# Patient Record
Sex: Male | Born: 1965 | Race: Black or African American | Hispanic: No | Marital: Married | Smoking: Never smoker
Health system: Southern US, Community
[De-identification: ages and names within clinical notes are randomized; demographics above are authoritative.]

## PROBLEM LIST (undated history)

## (undated) DIAGNOSIS — I10 Essential (primary) hypertension: Secondary | ICD-10-CM

## (undated) DIAGNOSIS — M542 Cervicalgia: Secondary | ICD-10-CM

## (undated) DIAGNOSIS — K635 Polyp of colon: Secondary | ICD-10-CM

## (undated) DIAGNOSIS — K219 Gastro-esophageal reflux disease without esophagitis: Secondary | ICD-10-CM

## (undated) DIAGNOSIS — Z800 Family history of malignant neoplasm of digestive organs: Secondary | ICD-10-CM

## (undated) DIAGNOSIS — E785 Hyperlipidemia, unspecified: Secondary | ICD-10-CM

## (undated) DIAGNOSIS — L02419 Cutaneous abscess of limb, unspecified: Secondary | ICD-10-CM

## (undated) DIAGNOSIS — Z8619 Personal history of other infectious and parasitic diseases: Secondary | ICD-10-CM

## (undated) DIAGNOSIS — G4730 Sleep apnea, unspecified: Secondary | ICD-10-CM

## (undated) DIAGNOSIS — F329 Major depressive disorder, single episode, unspecified: Secondary | ICD-10-CM

## (undated) DIAGNOSIS — L732 Hidradenitis suppurativa: Secondary | ICD-10-CM

## (undated) DIAGNOSIS — F32A Depression, unspecified: Secondary | ICD-10-CM

## (undated) DIAGNOSIS — Z789 Other specified health status: Secondary | ICD-10-CM

## (undated) DIAGNOSIS — M1990 Unspecified osteoarthritis, unspecified site: Secondary | ICD-10-CM

## (undated) HISTORY — PX: ABSCESS DRAINAGE: SHX1119

## (undated) HISTORY — DX: Hidradenitis suppurativa: L73.2

## (undated) HISTORY — DX: Unspecified osteoarthritis, unspecified site: M19.90

## (undated) HISTORY — DX: Cervicalgia: M54.2

## (undated) HISTORY — PX: ELBOW SURGERY: SHX618

## (undated) HISTORY — DX: Hyperlipidemia, unspecified: E78.5

## (undated) HISTORY — DX: Cutaneous abscess of limb, unspecified: L02.419

## (undated) HISTORY — DX: Major depressive disorder, single episode, unspecified: F32.9

## (undated) HISTORY — DX: Gastro-esophageal reflux disease without esophagitis: K21.9

## (undated) HISTORY — DX: Depression, unspecified: F32.A

## (undated) HISTORY — DX: Personal history of other infectious and parasitic diseases: Z86.19

## (undated) HISTORY — DX: Essential (primary) hypertension: I10

## (undated) HISTORY — DX: Sleep apnea, unspecified: G47.30

---
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
## 2015-10-08 ENCOUNTER — Encounter (HOSPITAL_COMMUNITY): Payer: Self-pay | Admitting: Emergency Medicine

## 2015-10-08 ENCOUNTER — Emergency Department (HOSPITAL_COMMUNITY)
Admission: EM | Admit: 2015-10-08 | Discharge: 2015-10-09 | Disposition: A | Discharge status: Home / Self Care | Payer: Managed Care, Other (non HMO) | Attending: Emergency Medicine | Admitting: Emergency Medicine

## 2015-10-08 DIAGNOSIS — S01512A Laceration without foreign body of oral cavity, initial encounter: Secondary | ICD-10-CM

## 2015-10-08 DIAGNOSIS — Y9289 Other specified places as the place of occurrence of the external cause: Secondary | ICD-10-CM | POA: Diagnosis not present

## 2015-10-08 DIAGNOSIS — S0993XA Unspecified injury of face, initial encounter: Secondary | ICD-10-CM | POA: Diagnosis present

## 2015-10-08 DIAGNOSIS — W228XXA Striking against or struck by other objects, initial encounter: Secondary | ICD-10-CM | POA: Diagnosis not present

## 2015-10-08 DIAGNOSIS — Y9383 Activity, rough housing and horseplay: Secondary | ICD-10-CM | POA: Insufficient documentation

## 2015-10-08 DIAGNOSIS — Z23 Encounter for immunization: Secondary | ICD-10-CM | POA: Insufficient documentation

## 2015-10-08 DIAGNOSIS — Y998 Other external cause status: Secondary | ICD-10-CM | POA: Diagnosis not present

## 2015-10-08 MED ORDER — LIDOCAINE HCL (PF) 1 % IJ SOLN
2.0000 mL | Freq: Once | INTRAMUSCULAR | Status: DC
  Filled 2015-10-08: qty 5

## 2015-10-08 MED ORDER — IBUPROFEN 200 MG PO TABS
400.0000 mg | ORAL_TABLET | Freq: Once | ORAL | Status: AC | PRN
  Administered 2015-10-08: 400 mg via ORAL
  Filled 2015-10-08: qty 2

## 2015-10-08 NOTE — ED Notes (Signed)
Patient called x1-no answer 
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
## 2015-10-08 NOTE — ED Notes (Signed)
Pt called to room, no responce

## 2015-10-09 MED ORDER — TETANUS-DIPHTH-ACELL PERTUSSIS 5-2.5-18.5 LF-MCG/0.5 IM SUSP
0.5000 mL | Freq: Once | INTRAMUSCULAR | Status: AC
  Administered 2015-10-09: 0.5 mL via INTRAMUSCULAR
  Filled 2015-10-09: qty 0.5

## 2015-10-09 NOTE — Discharge Instructions (Signed)
Make sure to rinse your mouth with water after all food and fluid consumption The sutures will dissolve over time and not need to be removed

## 2016-01-19 ENCOUNTER — Encounter (HOSPITAL_COMMUNITY): Payer: Self-pay

## 2016-01-19 ENCOUNTER — Emergency Department (HOSPITAL_COMMUNITY)
Admission: EM | Admit: 2016-01-19 | Discharge: 2016-01-19 | Disposition: A | Discharge status: Home / Self Care | Payer: Managed Care, Other (non HMO) | Attending: Emergency Medicine | Admitting: Emergency Medicine

## 2016-01-19 DIAGNOSIS — Z711 Person with feared health complaint in whom no diagnosis is made: Secondary | ICD-10-CM | POA: Diagnosis not present

## 2016-01-19 DIAGNOSIS — Z79899 Other long term (current) drug therapy: Secondary | ICD-10-CM | POA: Diagnosis not present

## 2016-01-19 DIAGNOSIS — R42 Dizziness and giddiness: Secondary | ICD-10-CM | POA: Diagnosis not present

## 2016-01-19 LAB — COMPREHENSIVE METABOLIC PANEL
ALBUMIN: 4.3 g/dL (ref 3.5–5.0)
ALK PHOS: 70 U/L (ref 38–126)
ALT: 27 U/L (ref 17–63)
ANION GAP: 6 (ref 5–15)
AST: 24 U/L (ref 15–41)
BUN: 14 mg/dL (ref 6–20)
CALCIUM: 9.2 mg/dL (ref 8.9–10.3)
CO2: 26 mmol/L (ref 22–32)
Chloride: 106 mmol/L (ref 101–111)
Creatinine, Ser: 1.05 mg/dL (ref 0.61–1.24)
GFR calc non Af Amer: >60 mL/min (ref >60)
Glucose, Bld: 83 mg/dL (ref 65–99)
Potassium: 4.1 mmol/L (ref 3.5–5.1)
SODIUM: 138 mmol/L (ref 135–145)
TOTAL PROTEIN: 7.2 g/dL (ref 6.5–8.1)
Total Bilirubin: 0.9 mg/dL (ref 0.3–1.2)

## 2016-01-19 LAB — CBC
HCT: 48.5 % (ref 39.0–52.0)
HEMOGLOBIN: 16.1 g/dL (ref 13.0–17.0)
MCH: 28 pg (ref 26.0–34.0)
MCHC: 33.2 g/dL (ref 30.0–36.0)
MCV: 84.5 fL (ref 78.0–100.0)
Platelets: 225 10*3/uL (ref 150–400)
RBC: 5.74 MIL/uL (ref 4.22–5.81)
RDW: 15.2 % (ref 11.5–15.5)
WBC: 6.3 10*3/uL (ref 4.0–10.5)

## 2016-01-19 LAB — I-STAT TROPONIN, ED: TROPONIN I, POC: 0 ng/mL (ref 0.00–0.08)

## 2016-01-19 NOTE — ED Notes (Signed)
Upon further questioning, pt states he has been depressed lately but denies SI/HI.  He wouldn't elaborate, however.
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
## 2016-01-19 NOTE — Discharge Instructions (Signed)
Return here as needed.  Follow-up with your primary care doctor °

## 2016-01-19 NOTE — ED Notes (Signed)
Pt states since last night around 6pm started getting short of breath with dizziness. Currently states he feels light headed with RT arm pain/tingling.  Denies n/v.

## 2016-01-20 LAB — HIV ANTIBODY (ROUTINE TESTING W REFLEX): HIV Screen 4th Generation wRfx: NONREACTIVE

## 2016-06-18 ENCOUNTER — Ambulatory Visit (INDEPENDENT_AMBULATORY_CARE_PROVIDER_SITE_OTHER): Payer: Managed Care, Other (non HMO) | Admitting: Pulmonary Disease

## 2016-06-18 ENCOUNTER — Ambulatory Visit (INDEPENDENT_AMBULATORY_CARE_PROVIDER_SITE_OTHER)
Admission: RE | Admit: 2016-06-18 | Discharge: 2016-06-18 | Disposition: A | Discharge status: Home / Self Care | Payer: Managed Care, Other (non HMO) | Source: Ambulatory Visit | Attending: Pulmonary Disease | Admitting: Pulmonary Disease

## 2016-06-18 ENCOUNTER — Encounter: Payer: Self-pay | Admitting: Pulmonary Disease

## 2016-06-18 VITALS — BP 122/84 | HR 72 | Ht 68.0 in | Wt 184.2 lb

## 2016-06-18 DIAGNOSIS — R05 Cough: Secondary | ICD-10-CM | POA: Diagnosis not present

## 2016-06-18 DIAGNOSIS — R053 Chronic cough: Secondary | ICD-10-CM

## 2016-06-18 DIAGNOSIS — R059 Cough, unspecified: Secondary | ICD-10-CM

## 2016-06-18 LAB — NITRIC OXIDE: Nitric Oxide: 10

## 2016-06-18 MED ORDER — FLUTICASONE PROPIONATE 50 MCG/ACT NA SUSP: 2.0000 | Freq: Every day | NASAL | 2 refills | Status: DC

## 2016-06-18 NOTE — Patient Instructions (Signed)
Finish course of prednisone Advair one puff twice per day, and rinse mouth after each use Proair two puffs every four to six hours as needed for cough, wheezing, or chest congestion Nasal irrigation (saline nasal spray) daily Flonase one spray each nostril daily >> use after doing nasal irrigation Chest xray today  Follow up in 3 weeks with Dr. Halford Chessman or Nurse Practitioner

## 2016-06-18 NOTE — Progress Notes (Signed)
   Subjective:    Patient ID: Jacob Foster, male    DOB: Jul 30, 1965, 50 y.o.   MRN: [id number]  HPI    Review of Systems  Constitutional: Negative for fever and unexpected weight change.  HENT: Negative for congestion, dental problem, ear pain, nosebleeds, postnasal drip, rhinorrhea, sinus pressure, sneezing, sore throat and trouble swallowing.   Eyes: Negative for redness and itching.  Respiratory: Positive for cough and wheezing. Negative for chest tightness and shortness of breath.        Chest congestion  Cardiovascular: Negative for palpitations and leg swelling.  Gastrointestinal: Negative for nausea and vomiting.  Genitourinary: Negative for dysuria.  Musculoskeletal: Negative for joint swelling.  Skin: Negative for rash.  Neurological: Negative for headaches.  Hematological: Does not bruise/bleed easily.  Psychiatric/Behavioral: Negative for dysphoric mood. The patient is not nervous/anxious.        Objective:   Physical Exam        Assessment & Plan:

## 2016-06-18 NOTE — Progress Notes (Addendum)
Past surgical history He  has a past surgical history that includes No past surgeries.  Family history His family history includes Breast cancer in his mother; Hyperlipidemia in his mother; Hypertension in his mother; Prostate cancer in his father.  Social history He  reports that he has never smoked. He has never used smokeless tobacco. He reports that he does not drink alcohol or use drugs.  No Known Allergies  No current outpatient prescriptions on file prior to visit.   No current facility-administered medications on file prior to visit.     Review of Systems  Constitutional: Negative for fever and unexpected weight change.  HENT: Negative for congestion, dental problem, ear pain, nosebleeds, postnasal drip, rhinorrhea, sinus pressure, sneezing, sore throat and trouble swallowing.   Eyes: Negative for redness and itching.  Respiratory: Positive for cough and wheezing. Negative for chest tightness and shortness of breath.        Chest congestion  Cardiovascular: Negative for palpitations and leg swelling.  Gastrointestinal: Negative for nausea and vomiting.  Genitourinary: Negative for dysuria.  Musculoskeletal: Negative for joint swelling.  Skin: Negative for rash.  Neurological: Negative for headaches.  Hematological: Does not bruise/bleed easily.  Psychiatric/Behavioral: Negative for dysphoric mood. The patient is not nervous/anxious.     Chief Complaint  Patient presents with  . PULMONARY CONSULT    Self referral - dry cough.     Pulmonary tests FeNO 06/18/16 >> 10  Past medical history He  has a past medical history of Depression; Hidradenitis suppurativa; and Hyperlipemia.  Vital signs BP 122/84 (BP Location: Left Arm, Cuff Size: Normal)   Pulse 72   Ht 5\' 8"  (1.727 m)   Wt 184 lb 3.2 oz (83.6 kg)   SpO2 98%   BMI 28.01 kg/m   History of present illness Jacob Foster is a 50 y.o. male with cough.  He developed a cough about 6 months ago, and this has  persisted.  He didn't have a problem prior to this.  He doesn't recall anything specifically that triggered his cough.  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  He never noticed a problem with his breathing at work before, and never was told he has asthma.  His cough seems to happen more in the morning after he wakes up, but can happen at other times of the day.  He can sometimes bring up clear sputum, but most of the time he has a dry cough.  He feels like the sputum comes from his throat and he has to clear his throat.  He is not aware of having allergies.  He will get winded walking an incline at times.  He will feel tight in his chest when he has a cough.  He was told by work place physician that he had some wheezing.  He was given prednisone, advair, and proair.  He only just started prednisone yesterday.  He never smoked, but his father was a heavy smoker.  He is from [place], [place], but has lived in [place] for years.  He denies history of pneumonia or tuberculosis.  He has a Programmer, systems.    He denies headache, eye discharge, hemoptysis, difficulty swallowing, skin rash, leg swelling.  Physical exam  General - No distress ENT - No sinus tenderness, no oral exudate, no LAN, no thyromegaly, TM clear, pupils equal/reactive Cardiac - s1s2 regular, no murmur, pulses symmetric Chest - No wheeze/rales/dullness, good air entry,  normal respiratory excursion Back - No focal tenderness Abd - Soft, non-tender, no organomegaly, + bowel sounds Ext - No edema Neuro - Normal strength, cranial nerves intact Skin - No rashes Psych - Normal mood, and behavior   CMP Latest Ref Rng & Units 01/19/2016  Glucose 65 - 99 mg/dL 83  BUN 6 - 20 mg/dL 14  Creatinine 0.61 - 1.24 mg/dL 1.05  Sodium 135 - 145 mmol/L 138  Potassium 3.5 - 5.1 mmol/L 4.1  Chloride 101 - 111 mmol/L 106  CO2 22 - 32 mmol/L 26  Calcium 8.9 - 10.3 mg/dL 9.2  Total Protein 6.5 - 8.1  g/dL 7.2  Total Bilirubin 0.3 - 1.2 mg/dL 0.9  Alkaline Phos 38 - 126 U/L 70  AST 15 - 41 U/L 24  ALT 17 - 63 U/L 27     CBC Latest Ref Rng & Units 01/19/2016  WBC 4.0 - 10.5 K/uL 6.3  Hemoglobin 13.0 - 17.0 g/dL 16.1  Hematocrit 39.0 - 52.0 % 48.5  Platelets 150 - 400 K/uL 225    Discussion He has cough for past 6 months.  His symptoms are suggestive of upper and lower airway disease.  He has potential occupational exposure that could be contributing to his symptoms.  Assessment/plan  Chronic cough. - will have him try nasal irrigation and flonase - finish course of prednisone - advair bid, prn proair - chest xray today - if symptoms persist then will need full PFTs, allergy testing, and possibly additional imaging studies of chest/sinuses - might need further assessment for occupational asthma if his symptoms persist   Patient Instructions  Finish course of prednisone Advair one puff twice per day, and rinse mouth after each use Proair two puffs every four to six hours as needed for cough, wheezing, or chest congestion Nasal irrigation (saline nasal spray) daily Flonase one spray each nostril daily >> use after doing nasal irrigation Chest xray today  Follow up in 3 weeks with Dr. Halford Chessman or Nurse Practitioner    Chesley Mires, MD [facility] Pager:  [phone] 06/18/2016, 12:07 PM

## 2016-06-19 ENCOUNTER — Telehealth: Payer: Self-pay | Admitting: Pulmonary Disease

## 2016-06-19 NOTE — Telephone Encounter (Signed)
Dg Chest 2 View  Result Date: 06/18/2016 CLINICAL DATA:  Chronic cough for 6 months EXAM: CHEST  2 VIEW COMPARISON:  None. FINDINGS: No active infiltrate or effusion is seen. Mediastinal and hilar contours are unremarkable. The heart is within normal limits in size. No bony abnormality is seen. IMPRESSION: No active cardiopulmonary disease. Electronically Signed   By: Ivar Drape M.D.   On: 06/18/2016 13:31     Will have my nurse inform pt that CXR was normal.

## 2016-06-20 NOTE — Telephone Encounter (Signed)
Spoke with pt. And results were given per VS. Nothing further is needed at this time.

## 2016-06-20 NOTE — Telephone Encounter (Signed)
Lmtcb. Will await call back 

## 2016-06-20 NOTE — Telephone Encounter (Signed)
Pt returning for results.Jacob Foster

## 2016-06-20 NOTE — Telephone Encounter (Signed)
LM x 1 

## 2016-07-18 ENCOUNTER — Ambulatory Visit: Payer: Managed Care, Other (non HMO) | Admitting: Adult Health

## 2016-07-22 ENCOUNTER — Ambulatory Visit: Payer: Managed Care, Other (non HMO) | Admitting: Adult Health

## 2017-09-04 ENCOUNTER — Other Ambulatory Visit: Payer: Self-pay

## 2017-09-04 ENCOUNTER — Encounter: Payer: Self-pay | Admitting: Family Medicine

## 2017-09-04 ENCOUNTER — Ambulatory Visit: Payer: Commercial Managed Care - PPO | Admitting: Family Medicine

## 2017-09-04 VITALS — BP 130/82 | HR 63 | Temp 98.5°F | Resp 14 | Ht 68.0 in | Wt 187.0 lb

## 2017-09-04 DIAGNOSIS — E663 Overweight: Secondary | ICD-10-CM

## 2017-09-04 DIAGNOSIS — R030 Elevated blood-pressure reading, without diagnosis of hypertension: Secondary | ICD-10-CM | POA: Diagnosis not present

## 2017-09-04 NOTE — Patient Instructions (Signed)
Please return in 1-3 months for your annual complete physical; please come fasting.   It was a pleasure meeting you today! Thank you for choosing [facility] to meet your healthcare needs! I truly look forward to working with you. If you have any questions or concerns, please send me a message via Mychart or call the office at [phone].   Fat and Cholesterol Restricted Diet Getting too much fat and cholesterol in your diet may cause health problems. Following this diet helps keep your fat and cholesterol at normal levels. This can keep you from getting sick. What types of fat should I choose?  Choose monosaturated and polyunsaturated fats. These are found in foods such as olive oil, canola oil, flaxseeds, walnuts, almonds, and seeds.  Eat more omega-3 fats. Good choices include salmon, mackerel, sardines, tuna, flaxseed oil, and ground flaxseeds.  Limit saturated fats. These are in animal products such as meats, butter, and cream. They can also be in plant products such as palm oil, palm kernel oil, and coconut oil.  Avoid foods with partially hydrogenated oils in them. These contain trans fats. Examples of foods that have trans fats are stick margarine, some tub margarines, cookies, crackers, and other baked goods. What general guidelines do I need to follow?  Check food labels. Look for the words "trans fat" and "saturated fat."  When preparing a meal: ? Fill half of your plate with vegetables and green salads. ? Fill one fourth of your plate with whole grains. Look for the word "whole" as the first word in the ingredient list. ? Fill one fourth of your plate with lean protein foods.  Eat more foods that have fiber, like apples, carrots, beans, peas, and barley.  Eat more home-cooked foods. Eat less at restaurants and buffets.  Limit or avoid alcohol.  Limit foods high in starch and sugar.  Limit fried foods.  Cook foods without frying them. Baking, boiling, grilling, and broiling  are all great options.  Lose weight if you are overweight. Losing even a small amount of weight can help your overall health. It can also help prevent diseases such as diabetes and heart disease. What foods can I eat? Grains Whole grains, such as whole wheat or whole grain breads, crackers, cereals, and pasta. Unsweetened oatmeal, bulgur, barley, quinoa, or brown rice. Corn or whole wheat flour tortillas. Vegetables Fresh or frozen vegetables (raw, steamed, roasted, or grilled). Green salads. Fruits All fresh, canned (in natural juice), or frozen fruits. Meat and Other Protein Products Ground beef (85% or leaner), grass-fed beef, or beef trimmed of fat. Skinless chicken or [place]. Ground chicken or [place]. Pork trimmed of fat. All fish and seafood. Eggs. Dried beans, peas, or lentils. Unsalted nuts or seeds. Unsalted canned or dry beans. Dairy Low-fat dairy products, such as skim or 1% milk, 2% or reduced-fat cheeses, low-fat ricotta or cottage cheese, or plain low-fat yogurt. Fats and Oils Tub margarines without trans fats. Light or reduced-fat mayonnaise and salad dressings. Avocado. Olive, canola, sesame, or safflower oils. Natural peanut or almond butter (choose ones without added sugar and oil). The items listed above may not be a complete list of recommended foods or beverages. Contact your dietitian for more options. What foods are not recommended? Grains White bread. White pasta. White rice. Cornbread. Bagels, pastries, and croissants. Crackers that contain trans fat. Vegetables White potatoes. Corn. Creamed or fried vegetables. Vegetables in a cheese sauce. Fruits Dried fruits. Canned fruit in light or heavy syrup. Fruit juice. Meat and  Other Protein Products Fatty cuts of meat. Ribs, chicken wings, bacon, sausage, bologna, salami, chitterlings, fatback, hot dogs, bratwurst, and packaged luncheon meats. Liver and organ meats. Dairy Whole or 2% milk, cream, half-and-half, and cream  cheese. Whole milk cheeses. Whole-fat or sweetened yogurt. Full-fat cheeses. Nondairy creamers and whipped toppings. Processed cheese, cheese spreads, or cheese curds. Sweets and Desserts Corn syrup, sugars, honey, and molasses. Candy. Jam and jelly. Syrup. Sweetened cereals. Cookies, pies, cakes, donuts, muffins, and ice cream. Fats and Oils Butter, stick margarine, lard, shortening, ghee, or bacon fat. Coconut, palm kernel, or palm oils. Beverages Alcohol. Sweetened drinks (such as sodas, lemonade, and fruit drinks or punches). The items listed above may not be a complete list of foods and beverages to avoid. Contact your dietitian for more information. This information is not intended to replace advice given to you by your health care provider. Make sure you discuss any questions you have with your health care provider. Document Released: 12/30/2011 Document Revised: 03/06/2016 Document Reviewed: 09/29/2013 Elsevier Interactive Patient Education  [employer].

## 2017-09-04 NOTE — Progress Notes (Signed)
Subjective  CC:  Chief Complaint  Patient presents with  . Establish Care    elevated blood pressure, cholesterol and snoring(possible sleep apnea)    HPI: Jacob Foster is a 52 y.o. male who presents to [place] at [facility] today to establish care with me as a new patient. Was last seen about 2 years ago at [facility]. Has recently been having screening labs/bp checks with work Marine scientist.  He has the following concerns or needs:   Elevated bp - work nurse has checked and it has been >140/90 over the last 4-5 readings. Concerned about HTN; no h/o HTN or medical treatments. These were bp screens. He has felt well. He has gained some weight over the last year due to eating out at [facility] [address] every day for lunch. Eats burgers, fries routinely. He would like to eat better. We discussed his eating habits in detail. Eats dinners at home. Rarely left overs due to 4 teens in the home. Dinners are mostly healthy.   HM: due for cpe and labwork  We updated and reviewed the patient's past history in detail and it is documented below.  Patient Active Problem List   Diagnosis Date Noted  . Elevated blood pressure reading without diagnosis of hypertension 09/05/2017  . Overweight (BMI 25.0-29.9) 09/05/2017   Health Maintenance  Topic Date Due  . Samul Dada  10/08/2025  . INFLUENZA VACCINE  Completed  . HIV Screening  Completed   Immunization History  Administered Date(s) Administered  . Influenza,inj,Quad PF,6+ Mos 05/13/2017  . Influenza,inj,quad, With Preservative 04/10/2016  . Tdap 10/09/2015   No outpatient medications have been marked as taking for the 09/04/17 encounter (Office Visit) with Leamon Arnt, MD.    Allergies: Patient has No Known Allergies. Past Medical History Patient  has a past medical history of Abscess of axilla, Depression, Hidradenitis suppurativa, History of chickenpox, Hyperlipemia, and Neck pain. Past Surgical History Patient  has a  past surgical history that includes No past surgeries and Abscess drainage. Family History: Patient family history includes Breast cancer in his mother; Diabetes in his brother; Healthy in his daughter, son, son, and son; Hyperlipidemia in his mother; Hypertension in his brother and mother; Kidney disease in his father; Prostate cancer in his father; Throat cancer in his brother. Social History:  Patient  reports that  has never smoked. he has never used smokeless tobacco. He reports that he does not drink alcohol or use drugs.  Review of Systems: Constitutional: negative for fever or malaise Ophthalmic: negative for photophobia, double vision or loss of vision Cardiovascular: negative for chest pain, dyspnea on exertion, or new LE swelling Respiratory: negative for SOB or persistent cough Gastrointestinal: negative for abdominal pain, change in bowel habits or melena Genitourinary: negative for dysuria or gross hematuria Musculoskeletal: negative for new gait disturbance or muscular weakness Integumentary: negative for new or persistent rashes Neurological: negative for TIA or stroke symptoms Psychiatric: negative for SI or delusions Allergic/Immunologic: negative for hives  Patient Care Team    Relationship Specialty Notifications Start End  Leamon Arnt, MD PCP - General Family Medicine  09/04/17     Objective  Vitals: BP 130/82   Pulse 63   Temp 98.5 F (36.9 C) (Oral)   Resp 14   Ht 5\' 8"  (1.727 m)   Wt 187 lb (84.8 kg)   SpO2 97%   BMI 28.43 kg/m  General:  Well developed, well nourished, no acute distress  Psych:  Alert  and oriented,normal mood and affect Cardiovascular:  RRR without gallop, rub or murmur, nondisplaced PMI Respiratory:  Good breath sounds bilaterally, CTAB with normal respiratory effort  Assessment  1. Elevated blood pressure reading without diagnosis of hypertension   2. Overweight (BMI 25.0-29.9)      Plan   Today's visit was 30 minutes long.  Greater than 50% of this time was devoted to face to face counseling with the patient and coordination of care. We discussed his diagnosis, prognosis, treatment options and educated on changes in eating and weight loss/healthy diet planning. To stop fast food; discussed healthier and convenient food choices/options.   Low salt diet and recheck bp at next visit. If achieves healthier eating and mild weight loss, blood pressure may normalize.   Next visit for cpe with labwork.   Follow up:  1-3 months for your annual complete physical; please come fasting.   Commons side effects, risks, benefits, and alternatives for medications and treatment plan prescribed today were discussed, and the patient expressed understanding of the given instructions. Patient is instructed to call or message via MyChart if he/she has any questions or concerns regarding our treatment plan. No barriers to understanding were identified. We discussed Red Flag symptoms and signs in detail. Patient expressed understanding regarding what to do in case of urgent or emergency type symptoms.   Medication list was reconciled, printed and provided to the patient in AVS. Patient instructions and summary information was reviewed with the patient as documented in the AVS. This note was prepared with assistance of Dragon voice recognition software. Occasional wrong-word or sound-a-like substitutions may have occurred due to the inherent limitations of voice recognition software  No orders of the defined types were placed in this encounter.  No orders of the defined types were placed in this encounter.

## 2017-09-05 ENCOUNTER — Encounter: Payer: Self-pay | Admitting: Family Medicine

## 2017-09-05 DIAGNOSIS — R030 Elevated blood-pressure reading, without diagnosis of hypertension: Secondary | ICD-10-CM | POA: Insufficient documentation

## 2017-09-05 DIAGNOSIS — E663 Overweight: Secondary | ICD-10-CM | POA: Insufficient documentation

## 2017-09-14 ENCOUNTER — Other Ambulatory Visit: Payer: Self-pay

## 2017-09-14 ENCOUNTER — Encounter: Payer: Self-pay | Admitting: Emergency Medicine

## 2017-09-14 ENCOUNTER — Ambulatory Visit: Payer: Commercial Managed Care - PPO | Admitting: Family Medicine

## 2017-09-14 ENCOUNTER — Encounter: Payer: Self-pay | Admitting: Family Medicine

## 2017-09-14 VITALS — BP 118/78 | HR 78 | Temp 98.2°F | Resp 17 | Ht 68.0 in | Wt 184.4 lb

## 2017-09-14 DIAGNOSIS — M545 Low back pain, unspecified: Secondary | ICD-10-CM

## 2017-09-14 MED ORDER — CYCLOBENZAPRINE HCL 10 MG PO TABS: 10.0000 mg | ORAL_TABLET | Freq: Every day | ORAL | 0 refills | Status: DC

## 2017-09-14 MED ORDER — DICLOFENAC SODIUM 75 MG PO TBEC: 75.0000 mg | DELAYED_RELEASE_TABLET | Freq: Two times a day (BID) | ORAL | 0 refills | Status: DC

## 2017-09-14 NOTE — Progress Notes (Signed)
Subjective  CC:  Chief Complaint  Patient presents with  . Back Pain    Lower back pain for about 4 days, he started running on the treadmill when it started    HPI: Jacob Foster is a 52 y.o. male who presents to the office today to address the problems listed above in the chief complaint.  As above, 52 year old male with no history of back problems presents to the acute onset of back pain.  He awoke with pain and stiffness.  This occurred after he had been running daily for the last 4 days at high speed.  After that he had not been physically active.  He denies heavy lifting, straining, or injury.  He denies radicular symptoms, bowel or bladder dysfunction, or weakness.  He is not taking any medications.  He describes low midline back pain and stiffness.  Overweight: He is lost 3-4 pounds since his last visit.  He is completely changed his diet and is motivated to eat healthy and lose weight.  Of note, his blood pressure today is completely normal  Wt Readings from Last 3 Encounters:  09/14/17 184 lb 6.4 oz (83.6 kg)  09/04/17 187 lb (84.8 kg)  06/18/16 184 lb 3.2 oz (83.6 kg)    I reviewed the patients updated PMH, FH, and SocHx.    Patient Active Problem List   Diagnosis Date Noted  . Elevated blood pressure reading without diagnosis of hypertension 09/05/2017  . Overweight (BMI 25.0-29.9) 09/05/2017   No outpatient medications have been marked as taking for the 09/14/17 encounter (Office Visit) with Leamon Arnt, MD.    Allergies: Patient has No Known Allergies. Family History: Patient family history includes Breast cancer in his mother; Diabetes in his brother; Healthy in his daughter, son, son, and son; Hyperlipidemia in his mother; Hypertension in his brother and mother; Kidney disease in his father; Prostate cancer in his father; Throat cancer in his brother.   Social History:  Patient  reports that  has never smoked. he has never used smokeless tobacco. He reports  that he does not drink alcohol or use drugs.  Review of Systems: Constitutional: Negative for fever malaise or anorexia Cardiovascular: negative for chest pain Respiratory: negative for SOB or persistent cough Gastrointestinal: negative for abdominal pain  Objective  Vitals: BP 118/78   Pulse 78   Temp 98.2 F (36.8 C) (Oral)   Resp 17   Ht 5\' 8"  (1.727 m)   Wt 184 lb 6.4 oz (83.6 kg)   SpO2 97%   BMI 28.04 kg/m  General: no acute distress , A&Ox3, sitting straight, moves slowly to table, appears mildly uncomfortable. Back Exam - Normal skin, Spine with normal alignment and without deformity.  No tenderness to vertebral process palpation.  Paraspinous muscles are tender and with spasm.   Range of motion is full at neck, range of motion is very limited at low back due to pain and spasm, negative straight leg raise bilaterally, +2 symmetric reflexes at knees and ankles, normal 5 out of 5 strength bilateral lower extremities Skin:  Warm, no rashes  Assessment  1. Acute midline low back pain without sciatica      Plan  Low back pain: Educated, start warm compresses, range of motion exercises, muscle relaxer and NSAIDs.  No running until improving, then recommend slowly increasing level of activity over time.    Follow up:   Return in 2-3 weeks if not improved for imaging.  Sooner if there are problems  Commons side effects, risks, benefits, and alternatives for medications and treatment plan prescribed today were discussed, and the patient expressed understanding of the given instructions. Patient is instructed to call or message via MyChart if he/she has any questions or concerns regarding our treatment plan. No barriers to understanding were identified. We discussed Red Flag symptoms and signs in detail. Patient expressed understanding regarding what to do in case of urgent or emergency type symptoms.   Medication list was reconciled, printed and provided to the patient in AVS.  Patient instructions and summary information was reviewed with the patient as documented in the AVS. This note was prepared with assistance of Dragon voice recognition software. Occasional wrong-word or sound-a-like substitutions may have occurred due to the inherent limitations of voice recognition software  No orders of the defined types were placed in this encounter.  Meds ordered this encounter  Medications  . diclofenac (VOLTAREN) 75 MG EC tablet    Sig: Take 1 tablet (75 mg total) by mouth 2 (two) times daily.    Dispense:  30 tablet    Refill:  0  . cyclobenzaprine (FLEXERIL) 10 MG tablet    Sig: Take 1 tablet (10 mg total) by mouth at bedtime.    Dispense:  30 tablet    Refill:  0

## 2017-09-14 NOTE — Patient Instructions (Signed)
Please return in 10-14 days to recheck your back.  If you have any questions or concerns, please don't hesitate to send me a message via MyChart or call the office at [phone]. Thank you for visiting with [place] today! It's our pleasure caring for you.   Back Exercises The following exercises strengthen the muscles that help to support the back. They also help to keep the lower back flexible. Doing these exercises can help to prevent back pain or lessen existing pain. If you have back pain or discomfort, try doing these exercises 2-3 times each day or as told by your health care provider. When the pain goes away, do them once each day, but increase the number of times that you repeat the steps for each exercise (do more repetitions). If you do not have back pain or discomfort, do these exercises once each day or as told by your health care provider. Exercises Single Knee to Chest  Repeat these steps 3-5 times for each leg: 1. Lie on your back on a firm bed or the floor with your legs extended. 2. Bring one knee to your chest. Your other leg should stay extended and in contact with the floor. 3. Hold your knee in place by grabbing your knee or thigh. 4. Pull on your knee until you feel a gentle stretch in your lower back. 5. Hold the stretch for 10-30 seconds. 6. Slowly release and straighten your leg.  Pelvic Tilt  Repeat these steps 5-10 times: 1. Lie on your back on a firm bed or the floor with your legs extended. 2. Bend your knees so they are pointing toward the ceiling and your feet are flat on the floor. 3. Tighten your lower abdominal muscles to press your lower back against the floor. This motion will tilt your pelvis so your tailbone points up toward the ceiling instead of pointing to your feet or the floor. 4. With gentle tension and even breathing, hold this position for 5-10 seconds.  Cat-Cow  Repeat these steps until your lower back becomes more flexible: 1. Get into a  hands-and-knees position on a firm surface. Keep your hands under your shoulders, and keep your knees under your hips. You may place padding under your knees for comfort. 2. Let your head hang down, and point your tailbone toward the floor so your lower back becomes rounded like the back of a cat. 3. Hold this position for 5 seconds. 4. Slowly lift your head and point your tailbone up toward the ceiling so your back forms a sagging arch like the back of a cow. 5. Hold this position for 5 seconds.  Press-Ups  Repeat these steps 5-10 times: 1. Lie on your abdomen (face-down) on the floor. 2. Place your palms near your head, about shoulder-width apart. 3. While you keep your back as relaxed as possible and keep your hips on the floor, slowly straighten your arms to raise the top half of your body and lift your shoulders. Do not use your back muscles to raise your upper torso. You may adjust the placement of your hands to make yourself more comfortable. 4. Hold this position for 5 seconds while you keep your back relaxed. 5. Slowly return to lying flat on the floor.  Bridges  Repeat these steps 10 times: 1. Lie on your back on a firm surface. 2. Bend your knees so they are pointing toward the ceiling and your feet are flat on the floor. 3. Tighten your buttocks muscles and  lift your buttocks off of the floor until your waist is at almost the same height as your knees. You should feel the muscles working in your buttocks and the back of your thighs. If you do not feel these muscles, slide your feet 1-2 inches farther away from your buttocks. 4. Hold this position for 3-5 seconds. 5. Slowly lower your hips to the starting position, and allow your buttocks muscles to relax completely.  If this exercise is too easy, try doing it with your arms crossed over your chest. Abdominal Crunches  Repeat these steps 5-10 times: 1. Lie on your back on a firm bed or the floor with your legs  extended. 2. Bend your knees so they are pointing toward the ceiling and your feet are flat on the floor. 3. Cross your arms over your chest. 4. Tip your chin slightly toward your chest without bending your neck. 5. Tighten your abdominal muscles and slowly raise your trunk (torso) high enough to lift your shoulder blades a tiny bit off of the floor. Avoid raising your torso higher than that, because it can put too much stress on your low back and it does not help to strengthen your abdominal muscles. 6. Slowly return to your starting position.  Back Lifts Repeat these steps 5-10 times: 1. Lie on your abdomen (face-down) with your arms at your sides, and rest your forehead on the floor. 2. Tighten the muscles in your legs and your buttocks. 3. Slowly lift your chest off of the floor while you keep your hips pressed to the floor. Keep the back of your head in line with the curve in your back. Your eyes should be looking at the floor. 4. Hold this position for 3-5 seconds. 5. Slowly return to your starting position.  Contact a health care provider if:  Your back pain or discomfort gets much worse when you do an exercise.  Your back pain or discomfort does not lessen within 2 hours after you exercise. If you have any of these problems, stop doing these exercises right away. Do not do them again unless your health care provider says that you can. Get help right away if:  You develop sudden, severe back pain. If this happens, stop doing the exercises right away. Do not do them again unless your health care provider says that you can. This information is not intended to replace advice given to you by your health care provider. Make sure you discuss any questions you have with your health care provider. Document Released: 08/07/2004 Document Revised: 11/07/2015 Document Reviewed: 08/24/2014 Elsevier Interactive Patient Education  2017 [employer]  Back Pain, Adult Many adults have back pain  from time to time. Common causes of back pain include:  A strained muscle or ligament.  Wear and tear (degeneration) of the spinal disks.  Arthritis.  A hit to the back.  Back pain can be short-lived (acute) or last a long time (chronic). A physical exam, lab tests, and imaging studies may be done to find the cause of your pain. Follow these instructions at home: Managing pain and stiffness  Take over-the-counter and prescription medicines only as told by your health care provider.  If directed, apply heat to the affected area as often as told by your health care provider. Use the heat source that your health care provider recommends, such as a moist heat pack or a heating pad. ? Place a towel between your skin and the heat source. ? Leave the heat  on for 20-30 minutes. ? Remove the heat if your skin turns bright red. This is especially important if you are unable to feel pain, heat, or cold. You have a greater risk of getting burned.  If directed, apply ice to the injured area: ? Put ice in a plastic bag. ? Place a towel between your skin and the bag. ? Leave the ice on for 20 minutes, 2-3 times a day for the first 2-3 days. Activity  Do not stay in bed. Resting more than 1-2 days can delay your recovery.  Take short walks on even surfaces as soon as you are able. Try to increase the length of time you walk each day.  Do not sit, drive, or stand in one place for more than 30 minutes at a time. Sitting or standing for long periods of time can put stress on your back.  Use proper lifting techniques. When you bend and lift, use positions that put less stress on your back: ? [place] your knees. ? Keep the load close to your body. ? Avoid twisting.  Exercise regularly as told by your health care provider. Exercising will help your back heal faster. This also helps prevent back injuries by keeping muscles strong and flexible.  Your health care provider may recommend that you see a  physical therapist. This person can help you come up with a safe exercise program. Do any exercises as told by your physical therapist. Lifestyle  Maintain a healthy weight. Extra weight puts stress on your back and makes it difficult to have good posture.  Avoid activities or situations that make you feel anxious or stressed. Learn ways to manage anxiety and stress. One way to manage stress is through exercise. Stress and anxiety increase muscle tension and can make back pain worse. General instructions  Sleep on a firm mattress in a comfortable position. Try lying on your side with your knees slightly bent. If you lie on your back, put a pillow under your knees.  Follow your treatment plan as told by your health care provider. This may include: ? Cognitive or behavioral therapy. ? Acupuncture or massage therapy. ? Meditation or yoga. Contact a health care provider if:  You have pain that is not relieved with rest or medicine.  You have increasing pain going down into your legs or buttocks.  Your pain does not improve in 2 weeks.  You have pain at night.  You lose weight.  You have a fever or chills. Get help right away if:  You develop new bowel or bladder control problems.  You have unusual weakness or numbness in your arms or legs.  You develop nausea or vomiting.  You develop abdominal pain.  You feel faint. Summary  Many adults have back pain from time to time. A physical exam, lab tests, and imaging studies may be done to find the cause of your pain.  Use proper lifting techniques. When you bend and lift, use positions that put less stress on your back.  Take over-the-counter and prescription medicines and apply heat or ice as directed by your health care provider. This information is not intended to replace advice given to you by your health care provider. Make sure you discuss any questions you have with your health care provider. Document Released: 06/30/2005  Document Revised: 08/04/2016 Document Reviewed: 08/04/2016 Elsevier Interactive Patient Education  [employer].

## 2017-09-24 ENCOUNTER — Ambulatory Visit: Payer: Commercial Managed Care - PPO | Admitting: Family Medicine

## 2017-11-13 ENCOUNTER — Encounter: Payer: Commercial Managed Care - PPO | Admitting: Family Medicine

## 2017-11-27 ENCOUNTER — Encounter: Payer: Commercial Managed Care - PPO | Admitting: Family Medicine

## 2017-11-27 ENCOUNTER — Encounter: Payer: Self-pay | Admitting: Family Medicine

## 2017-11-27 ENCOUNTER — Ambulatory Visit (INDEPENDENT_AMBULATORY_CARE_PROVIDER_SITE_OTHER): Payer: Commercial Managed Care - PPO | Admitting: Family Medicine

## 2017-11-27 ENCOUNTER — Other Ambulatory Visit: Payer: Self-pay

## 2017-11-27 VITALS — BP 122/70 | HR 83 | Temp 97.7°F | Ht 68.0 in | Wt 181.4 lb

## 2017-11-27 DIAGNOSIS — Z0000 Encounter for general adult medical examination without abnormal findings: Secondary | ICD-10-CM

## 2017-11-27 NOTE — Progress Notes (Signed)
Subjective  Chief Complaint  Patient presents with  . Annual Exam    doing well, no complaints, HM up to date    HPI: Jacob Foster is a 52 y.o. male who presents to [place] [facility] today for a Male Wellness Visit.   Wellness Visit: annual visit with health maintenance review and exam    Doing well overall. Had left lateral epicondylitis surgery about 6 weeks ago and is improving but still with some soreness. Diet remains improved and continues to lose weight. No concerns.   Lifestyle: Body mass index is 27.58 kg/m. Wt Readings from Last 3 Encounters:  11/27/17 181 lb 6.4 oz (82.3 kg)  09/14/17 184 lb 6.4 oz (83.6 kg)  09/04/17 187 lb (84.8 kg)   Diet: low fat Exercise: intermittently,  Wt Readings from Last 3 Encounters:  11/27/17 181 lb 6.4 oz (82.3 kg)  09/14/17 184 lb 6.4 oz (83.6 kg)  09/04/17 187 lb (84.8 kg)    Patient Active Problem List   Diagnosis Date Noted  . Overweight (BMI 25.0-29.9) 09/05/2017   Health Maintenance  Topic Date Due  . INFLUENZA VACCINE  02/11/2018  . TETANUS/TDAP  10/08/2025  . HIV Screening  Completed   Immunization History  Administered Date(s) Administered  . Influenza,inj,Quad PF,6+ Mos 05/13/2017  . Influenza,inj,quad, With Preservative 04/10/2016  . Tdap 10/09/2015   We updated and reviewed the patient's past history in detail and it is documented below. Allergies: Patient has No Known Allergies. Past Medical History  has a past medical history of Abscess of axilla, Depression, Hidradenitis suppurativa, History of chickenpox, Hyperlipemia, and Neck pain. Past Surgical History  has a past surgical history that includes No past surgeries; Abscess drainage; and Elbow surgery. Social History Patient  reports that he has never smoked. He has never used smokeless tobacco. He reports that he does not drink alcohol or use drugs. Family History Patient family history includes Breast cancer in his mother;  Diabetes in his brother; Healthy in his daughter, son, son, and son; Hyperlipidemia in his mother; Hypertension in his brother and mother; Kidney disease in his father; Prostate cancer in his father; Throat cancer in his brother. Review of Systems: Constitutional: negative for fever or malaise Ophthalmic: negative for photophobia, double vision or loss of vision Cardiovascular: negative for chest pain, dyspnea on exertion, or new LE swelling Respiratory: negative for SOB or persistent cough Gastrointestinal: negative for abdominal pain, change in bowel habits or melena Genitourinary: negative for dysuria or gross hematuria Musculoskeletal: negative for new gait disturbance or muscular weakness Integumentary: negative for new or persistent rashes, no breast lumps Neurological: negative for TIA or stroke symptoms Psychiatric: negative for SI or delusions Allergic/Immunologic: negative for hives  Patient Care Team    Relationship Specialty Notifications Start End  Leamon Arnt, MD PCP - General Family Medicine  09/04/17    Objective  Vitals: BP 122/70   Pulse 83   Temp 97.7 F (36.5 C)   Ht 5\' 8"  (1.727 m)   Wt 181 lb 6.4 oz (82.3 kg)   BMI 27.58 kg/m  General:  Well developed, well nourished, no acute distress  Psych:  Alert and orientedx3,normal mood and affect HEENT:  Normocephalic, atraumatic, non-icteric sclera, PERRL, oropharynx is clear without mass or exudate, supple neck without adenopathy, mass or thyromegaly Cardiovascular:  Normal S1, S2, RRR without gallop, rub or murmur, nondisplaced PMI, +2 distal pulses in bilateral upper and lower extremities. Respiratory:  Good breath sounds bilaterally, CTAB  with normal respiratory effort Gastrointestinal: normal bowel sounds, soft, non-tender, no noted masses. No HSM MSK: no deformities, contusions. Joints are without erythema or swelling. Right lateral epicondyle ttp, and decreased extension of right arm, Spine and CVA region are  nontender Skin:  Warm, no rashes or suspicious lesions noted Neurologic:    Mental status is normal. CN 2-11 are normal. Gross motor and sensory exams are normal. Stable gait. No tremor GU: No inguinal hernias or adenopathy are appreciated bilaterally  Assessment  1. Annual physical exam        Plan  Male Wellness Visit:  Age appropriate Health Maintenance and Prevention measures were discussed with patient. Included topics are cancer screening recommendations, ways to keep healthy (see AVS) including dietary and exercise recommendations, regular eye and dental care, use of seat belts, and avoidance of moderate alcohol use and tobacco use.   BMI: discussed patient's BMI and encouraged positive lifestyle modifications to help get to or maintain a target BMI. Continue balanced healthy diet and increase exercise. bp has normalized with weight loss.  HM needs and immunizations were addressed and ordered. See below for orders. See HM and immunization section for updates.  Routine labs and screening tests ordered including cmp, cbc and lipids where appropriate.  Discussed recommendations regarding Vit D and calcium supplementation (see AVS)  Follow up: 12 months for cpe.    Commons side effects, risks, benefits, and alternatives for medications and treatment plan prescribed today were discussed, and the patient expressed understanding of the given instructions. Patient is instructed to call or message via MyChart if he/she has any questions or concerns regarding our treatment plan. No barriers to understanding were identified. We discussed Red Flag symptoms and signs in detail. Patient expressed understanding regarding what to do in case of urgent or emergency type symptoms.   Medication list was reconciled, printed and provided to the patient in AVS. Patient instructions and summary information was reviewed with the patient as documented in the AVS. This note was prepared with assistance of  Dragon voice recognition software. Occasional wrong-word or sound-a-like substitutions may have occurred due to the inherent limitations of voice recognition software  Orders Placed This Encounter  Procedures  . CBC with Differential/Platelet  . Comprehensive metabolic panel  . Lipid panel  . HIV antibody   No orders of the defined types were placed in this encounter.

## 2017-11-27 NOTE — Patient Instructions (Addendum)
Please schedule a follow up appointment with me in 1 year for your physical.   Call our office in the Fall (Starting in September) to get your flu shot!  Please go to the Lab for blood work.    If you have MyChart, your results will be available to view, please respond through [facility] with questions.  We will schedule follow-up according to results.  Please do these things to maintain good health!   Exercise at least 30-45 minutes a day,  4-5 days a week.   Eat a low-fat diet with lots of fruits and vegetables, up to 7-9 servings per day.  Drink plenty of water daily. Try to drink 8 8oz glasses per day.  Seatbelts can save your life. Always wear your seatbelt.  Place Smoke Detectors on every level of your home and check batteries every year.  Eye Doctor - have an eye exam every 1-2 years  Safe sex - use condoms to protect yourself from STDs if you could be exposed to these types of infections.  Avoid heavy alcohol use. If you drink, keep it to less than 2 drinks/day and not every day.  [place].  Choose someone you trust that could speak for you if you became unable to speak for yourself.  Depression is common in our stressful world.If you're feeling down or losing interest in things you normally enjoy, please come in for a visit.

## 2017-11-28 LAB — HIV ANTIBODY (ROUTINE TESTING W REFLEX): HIV 1&2 Ab, 4th Generation: NONREACTIVE

## 2017-11-28 LAB — CBC WITH DIFFERENTIAL/PLATELET
BASOS ABS: 50 {cells}/uL (ref 0–200)
Basophils Relative: 1 %
EOS PCT: 2.2 %
Eosinophils Absolute: 110 cells/uL (ref 15–500)
HEMATOCRIT: 53.1 % — AB (ref 38.5–50.0)
Hemoglobin: 17.8 g/dL — ABNORMAL HIGH (ref 13.2–17.1)
Lymphs Abs: 2185 cells/uL (ref 850–3900)
MCH: 28.4 pg (ref 27.0–33.0)
MCHC: 33.5 g/dL (ref 32.0–36.0)
MCV: 84.7 fL (ref 80.0–100.0)
MONOS PCT: 9.2 %
MPV: 10.9 fL (ref 7.5–12.5)
NEUTROS PCT: 43.9 %
Neutro Abs: 2195 cells/uL (ref 1500–7800)
Platelets: 241 10*3/uL (ref 140–400)
RBC: 6.27 10*6/uL — ABNORMAL HIGH (ref 4.20–5.80)
RDW: 15.2 % — AB (ref 11.0–15.0)
Total Lymphocyte: 43.7 %
WBC: 5 10*3/uL (ref 3.8–10.8)
WBCMIX: 460 {cells}/uL (ref 200–950)

## 2017-11-28 LAB — LIPID PANEL
CHOLESTEROL: 238 mg/dL — AB (ref <200)
HDL: 41 mg/dL (ref >40)
LDL CHOLESTEROL (CALC): 156 mg/dL — AB
Non-HDL Cholesterol (Calc): 197 mg/dL (calc) — ABNORMAL HIGH (ref <130)
TRIGLYCERIDES: 251 mg/dL — AB (ref <150)
Total CHOL/HDL Ratio: 5.8 (calc) — ABNORMAL HIGH (ref <5.0)

## 2017-11-28 LAB — COMPREHENSIVE METABOLIC PANEL
AG Ratio: 1.5 (calc) (ref 1.0–2.5)
ALT: 26 U/L (ref 9–46)
AST: 23 U/L (ref 10–40)
Albumin: 4.6 g/dL (ref 3.6–5.1)
Alkaline phosphatase (APISO): 86 U/L (ref 40–115)
BUN: 16 mg/dL (ref 7–25)
CO2: 28 mmol/L (ref 20–32)
Calcium: 9.5 mg/dL (ref 8.6–10.3)
Chloride: 102 mmol/L (ref 98–110)
Creat: 1.1 mg/dL (ref 0.60–1.35)
Globulin: 3 g/dL (calc) (ref 1.9–3.7)
Glucose, Bld: 91 mg/dL (ref 65–99)
Potassium: 4.2 mmol/L (ref 3.5–5.3)
SODIUM: 140 mmol/L (ref 135–146)
TOTAL PROTEIN: 7.6 g/dL (ref 6.1–8.1)
Total Bilirubin: 0.7 mg/dL (ref 0.2–1.2)

## 2017-11-30 ENCOUNTER — Encounter: Payer: Self-pay | Admitting: Family Medicine

## 2017-11-30 NOTE — Progress Notes (Signed)
I have reviewed results. Normal. Patient notified by letter. Please see letter for details. 

## 2017-11-30 NOTE — Progress Notes (Signed)
Lab results mailed to patient in letter. Normal results. No action / follow up needed on these results.

## 2018-04-05 ENCOUNTER — Encounter (HOSPITAL_COMMUNITY): Payer: Self-pay | Admitting: Emergency Medicine

## 2018-04-05 ENCOUNTER — Emergency Department (HOSPITAL_COMMUNITY)
Admission: EM | Admit: 2018-04-05 | Discharge: 2018-04-05 | Disposition: A | Discharge status: Home / Self Care | Payer: Commercial Managed Care - PPO | Attending: Emergency Medicine | Admitting: Emergency Medicine

## 2018-04-05 DIAGNOSIS — Z79899 Other long term (current) drug therapy: Secondary | ICD-10-CM | POA: Insufficient documentation

## 2018-04-05 DIAGNOSIS — K5900 Constipation, unspecified: Secondary | ICD-10-CM | POA: Insufficient documentation

## 2018-04-05 DIAGNOSIS — K219 Gastro-esophageal reflux disease without esophagitis: Secondary | ICD-10-CM | POA: Insufficient documentation

## 2018-04-05 DIAGNOSIS — R1084 Generalized abdominal pain: Secondary | ICD-10-CM | POA: Diagnosis present

## 2018-04-05 LAB — COMPREHENSIVE METABOLIC PANEL
ALK PHOS: 69 U/L (ref 38–126)
ALT: 28 U/L (ref 0–44)
AST: 24 U/L (ref 15–41)
Albumin: 4.7 g/dL (ref 3.5–5.0)
Anion gap: 9 (ref 5–15)
BILIRUBIN TOTAL: 1.1 mg/dL (ref 0.3–1.2)
BUN: 16 mg/dL (ref 6–20)
CALCIUM: 9.4 mg/dL (ref 8.9–10.3)
CO2: 26 mmol/L (ref 22–32)
CREATININE: 1.06 mg/dL (ref 0.61–1.24)
Chloride: 105 mmol/L (ref 98–111)
Glucose, Bld: 83 mg/dL (ref 70–99)
Potassium: 4.2 mmol/L (ref 3.5–5.1)
Sodium: 140 mmol/L (ref 135–145)
TOTAL PROTEIN: 8.5 g/dL — AB (ref 6.5–8.1)

## 2018-04-05 LAB — CBC
HCT: 55.1 % — ABNORMAL HIGH (ref 39.0–52.0)
Hemoglobin: 18.7 g/dL — ABNORMAL HIGH (ref 13.0–17.0)
MCH: 29.4 pg (ref 26.0–34.0)
MCHC: 33.9 g/dL (ref 30.0–36.0)
MCV: 86.8 fL (ref 78.0–100.0)
Platelets: 239 10*3/uL (ref 150–400)
RBC: 6.35 MIL/uL — AB (ref 4.22–5.81)
RDW: 14.2 % (ref 11.5–15.5)
WBC: 6.7 10*3/uL (ref 4.0–10.5)

## 2018-04-05 LAB — LIPASE, BLOOD: Lipase: 32 U/L (ref 11–51)

## 2018-04-05 MED ORDER — OMEPRAZOLE 20 MG PO CPDR: 40.0000 mg | DELAYED_RELEASE_CAPSULE | Freq: Every day | ORAL | 0 refills | Status: DC

## 2018-04-05 NOTE — ED Provider Notes (Signed)
[facility] DEPT Provider Note   CSN: [id number] Arrival date & time: 04/05/18  1201     History   Chief Complaint Chief Complaint  Patient presents with  . Abdominal Pain    HPI Jacob Foster is a 51 y.o. male is here for evaluation of abdominal pain.  Onset 1 month ago.  Described as cramping and sharp, intermittent.  States the pain jumps all over.  Currently having no pain.  Pain occurs randomly, sometimes after meals, sometimes on an empty stomach and sometimes throughout the night as well.  He has not found a pattern to it.  He has taken antiacid medication without significant relief.  Pain is moderate.  Reports intermittent nausea and food regurgitation in his throat, sour taste in throat, belching.  Additionally reports constipation, has a BM every 4 to 5 days and it is very hard.  Sometimes hears "gurgling" in his stomach.  Notes he has been very anxious about the pain.  He denies associated fevers, chills, difficulty swallowing or sore throat, chest pain, shortness of breath, vomiting, diarrhea, hematochezia, melena, dysuria.  No previous abdominal surgeries.  No EtOH or heavy NSAID use.  No history of ulcers.  No history of kidney stones.  HPI  Past Medical History:  Diagnosis Date  . Abscess of axilla   . Depression   . Hidradenitis suppurativa   . History of chickenpox   . Hyperlipemia   . Neck pain     Patient Active Problem List   Diagnosis Date Noted  . Overweight (BMI 25.0-29.9) 09/05/2017    Past Surgical History:  Procedure Laterality Date  . ABSCESS DRAINAGE    . ELBOW SURGERY    . NO PAST SURGERIES          Home Medications    Prior to Admission medications   Medication Sig Start Date End Date Taking? Authorizing Provider  calcium carbonate (TUMS - DOSED IN MG ELEMENTAL CALCIUM) 500 MG chewable tablet Chew 2 tablets by mouth daily as needed for indigestion or heartburn.   Yes [provider]    Carboxymethylcellulose Sodium (EYE DROPS OP) Place 2-3 drops into both eyes daily as needed (dry eyes).   Yes [provider]  ibuprofen (ADVIL,MOTRIN) 200 MG tablet Take 200 mg by mouth every 6 (six) hours as needed.   Yes [provider]  omeprazole (PRILOSEC) 20 MG capsule Take 2 capsules (40 mg total) by mouth daily. 04/05/18 05/05/18  Kinnie Feil, PA-C    Family History Family History  Problem Relation Age of Onset  . Hypertension Mother   . Hyperlipidemia Mother   . Breast cancer Mother   . Prostate cancer Father   . Kidney disease Father   . Throat cancer Brother   . Hypertension Brother   . Healthy Daughter   . Healthy Son   . Healthy Son   . Healthy Son   . Diabetes Brother     Social History Social History   Tobacco Use  . Smoking status: Never Smoker  . Smokeless tobacco: Never Used  Substance Use Topics  . Alcohol use: No  . Drug use: No     Allergies   Patient has no known allergies.   Review of Systems Review of Systems  Gastrointestinal: Positive for abdominal pain, constipation and nausea.       Regurgitation  All other systems reviewed and are negative.    Physical Exam Updated Vital Signs BP (!) 160/91 (BP Location: Left  Arm)   Pulse 82   Temp 99 F (37.2 C) (Oral)   Resp 18   Ht 5\' 8"  (1.727 m)   Wt 81.6 kg   SpO2 99%   BMI 27.37 kg/m   Physical Exam  Constitutional: He is oriented to person, place, and time. He appears well-developed and well-nourished.  Non toxic.  HENT:  Head: Normocephalic and atraumatic.  Nose: Nose normal.  Eyes: Pupils are equal, round, and reactive to light. Conjunctivae and EOM are normal.  Neck: Normal range of motion.  Cardiovascular: Normal rate, regular rhythm and normal heart sounds.  Pulmonary/Chest: Effort normal and breath sounds normal.  Abdominal: Soft. Bowel sounds are normal. There is no tenderness.  No G/R/R. No suprapubic or CVA tenderness. Negative Murphy's and  McBurney's. Active BS. Soft.   Musculoskeletal: Normal range of motion.  Neurological: He is alert and oriented to person, place, and time.  Skin: Skin is warm and dry. Capillary refill takes less than 2 seconds.  Psychiatric: He has a normal mood and affect. His behavior is normal. Judgment and thought content normal.  Nursing note and vitals reviewed.    ED Treatments / Results  Labs (all labs ordered are listed, but only abnormal results are displayed) Labs Reviewed  COMPREHENSIVE METABOLIC PANEL - Abnormal; Notable for the following components:      Result Value   Total Protein 8.5 (*)    All other components within normal limits  CBC - Abnormal; Notable for the following components:   RBC 6.35 (*)    Hemoglobin 18.7 (*)    HCT 55.1 (*)    All other components within normal limits  LIPASE, BLOOD    EKG None  Radiology No results found.  Procedures Procedures (including critical care time)  Medications Ordered in ED Medications - No data to display   Initial Impression / Assessment and Plan / ED Course  I have reviewed the triage vital signs and the nursing notes.  Pertinent labs & imaging results that were available during my care of the patient were reviewed by me and considered in my medical decision making (see chart for details).     No red flag symptoms of abdominal pain including fever, vomiting, diarrhea, hematemesis, melena, previous abdominal surgery, peritonitis.  Given symptomatology, I think that acid reflux and/or constipation may be playing a role.  He has no tenderness on exam.  Negative Murphy's and McBurney's.  No urinary symptoms.  I doubt pancreatitis, cholecystitis, pyelonephritis, UTI, SBO.   Lab work reassuring.  Repeat abdominal exam reassuring without tenderness.  Discussed plan to discharge with management for acid reflux and bowel regimen for constipation.  Encouraged patient to follow-up with GI in the next 1 to 2 weeks for reevaluation of  persistent symptoms.  Discussed return precautions.  Patient is comfortable with this plan.  Final Clinical Impressions(s) / ED Diagnoses   Final diagnoses:  Generalized abdominal cramping  Constipation in male  Gastroesophageal reflux disease without esophagitis    ED Discharge Orders         Ordered    omeprazole (PRILOSEC) 20 MG capsule  Daily     04/05/18 1626           Arlean Hopping 04/05/18 1630    Mesner, Corene Cornea, MD 04/05/18 1651

## 2018-04-05 NOTE — ED Notes (Signed)
ED Provider at bedside. 

## 2018-04-05 NOTE — Discharge Instructions (Addendum)
You were seen in the ER for abdominal pain for 1 month.  Your lab work is reassuring.  Your kidney, liver, pancreas function are normal.  He did not have any abdominal pain in the ER  I think your symptoms may be from mild acid reflux and constipation.  Take omeprazole 40 mg daily, in the morning on an empty stomach before eating.  You can use Mylanta for breakthrough symptoms.  Additionally, take MiraLAX (polyethylenol glycol) and a stool softener (docusate) every day to help regulate your bowel movements.  Follow-up with gastroenterology in the next 1 to 2 weeks if your symptoms are not improving.  Return to the ER for worsening abdominal pain, vomiting, diarrhea, blood in your stools, fevers, localized abdominal pain to your right upper or lower abdomen or flanks.

## 2018-04-05 NOTE — ED Triage Notes (Signed)
Pt c/o abd pains at night time is when "cramps up on me and I can't get sleep" for months.

## 2018-04-12 ENCOUNTER — Ambulatory Visit: Payer: Commercial Managed Care - PPO | Admitting: Family Medicine

## 2018-04-16 ENCOUNTER — Ambulatory Visit (INDEPENDENT_AMBULATORY_CARE_PROVIDER_SITE_OTHER): Payer: Commercial Managed Care - PPO

## 2018-04-16 ENCOUNTER — Other Ambulatory Visit: Payer: Self-pay

## 2018-04-16 ENCOUNTER — Encounter: Payer: Self-pay | Admitting: Family Medicine

## 2018-04-16 ENCOUNTER — Ambulatory Visit: Payer: Commercial Managed Care - PPO | Admitting: Family Medicine

## 2018-04-16 VITALS — BP 124/81 | HR 80 | Temp 98.1°F | Resp 18 | Ht 68.0 in | Wt 180.2 lb

## 2018-04-16 DIAGNOSIS — M542 Cervicalgia: Secondary | ICD-10-CM | POA: Diagnosis not present

## 2018-04-16 DIAGNOSIS — R079 Chest pain, unspecified: Secondary | ICD-10-CM

## 2018-04-16 NOTE — Progress Notes (Signed)
   Subjective:    Patient ID: Jacob Foster, male    DOB: [birth year], 52 y.o.   MRN: [id number]  HPI R sided neck pain- 'it's swelled up on me a couple of times'.  'today it's doing fine'.  'it will start swelling up at the end of the work day'.  Pt reports that when his elbow pain flares up, neck will flare up but neck can flare w/o elbow flaring.    CP- pt was seen in ER on 10/1.  dx'd w/ GERD.  Reports his heart 'has a weak spot'- but 'can't explain it'.  Labs, EKG, CXR were all WNL.  Asymptomatic today.  Denies SOB, palpitations, N/V, dizziness   Review of Systems For ROS see HPI     Objective:   Physical Exam  Constitutional: He is oriented to person, place, and time. He appears well-developed and well-nourished. No distress.  HENT:  Head: Normocephalic and atraumatic.  Neck: Normal range of motion. Neck supple.  Mild TTP over R trap  Cardiovascular: Normal rate, regular rhythm, normal heart sounds and intact distal pulses.  Pulmonary/Chest: Effort normal and breath sounds normal. No respiratory distress. He has no wheezes.  Lymphadenopathy:    He has no cervical adenopathy.  Neurological: He is alert and oriented to person, place, and time. He displays normal reflexes. No cranial nerve deficit. He exhibits normal muscle tone. Coordination normal.  Skin: Skin is warm and dry.  Psychiatric: His behavior is normal.  anxious  Vitals reviewed.         Assessment & Plan:  Neck pain- new to provider and not currently an issue for pt.  He denies pain today but reports that he will have intermittent swelling of R neck.  No swelling present today.  Will at times have related elbow and neck pain but can have neck pain independently.  Mild TTP over R trap.  Full ROM.  Given possible radiation of pain into upper arm, will get Cspine films to assess for spurs, stenosis, or other abnormality.  Encouraged supportive care for muscle pain.  Pt to f/u w/ PCP if no improvement.  CP- pt had (-)  w/u in ER.  Asymptomatic in office.  He seems anxious about this and keeps saying that his heart has 'a weak spot' but is unable to explain that further.  Reassured him that recent work up was WNL.  He did say in the office that, 'I know this isn't all in my head'- so i'm not sure who told him that previously.  Encouraged him to keep track of his symptoms and discuss w/ PCP if no improvement.  Pt expressed understanding and is in agreement w/ plan.

## 2018-04-16 NOTE — Patient Instructions (Signed)
Follow up w/ Dr Jonni Sanger in 1-2 weeks to recheck neck pain and chest discomfort Go to the [facility] at [place] and get your xrays done Use heat or ice for the neck pain Call with any questions or concerns Hang in there!!

## 2018-04-23 ENCOUNTER — Encounter: Payer: Self-pay | Admitting: Family Medicine

## 2018-04-23 ENCOUNTER — Ambulatory Visit: Payer: Commercial Managed Care - PPO | Admitting: Family Medicine

## 2018-04-23 ENCOUNTER — Other Ambulatory Visit: Payer: Self-pay

## 2018-04-23 VITALS — BP 138/80 | HR 87 | Temp 97.7°F | Ht 68.0 in | Wt 182.4 lb

## 2018-04-23 DIAGNOSIS — F338 Other recurrent depressive disorders: Secondary | ICD-10-CM | POA: Diagnosis not present

## 2018-04-23 DIAGNOSIS — F411 Generalized anxiety disorder: Secondary | ICD-10-CM | POA: Insufficient documentation

## 2018-04-23 DIAGNOSIS — Z23 Encounter for immunization: Secondary | ICD-10-CM | POA: Diagnosis not present

## 2018-04-23 MED ORDER — BUPROPION HCL ER (SR) 150 MG PO TB12: 150.0000 mg | ORAL_TABLET | Freq: Two times a day (BID) | ORAL | 2 refills | Status: DC

## 2018-04-23 NOTE — Progress Notes (Signed)
Subjective  CC:  Chief Complaint  Patient presents with  . Follow-up    chest Pain is better, but still having some neck pain     HPI: Jacob Foster is a 52 y.o. male who presents to the office today to address the problems listed above in the chief complaint.  [name] returns for follow-up of his multiple complaints.  Over the last 6 weeks or so he is been to the ER 3 times.  Is been evaluated for neck pain, GERD-like symptoms x2.  Was seen last week here for follow-up neck pain.  Further chart review, he has had multiple ER visits over the last several years.  We discussed his symptoms and his worry.  Overall he is always concerned about his health.  If he has any pain he thinks the worse.  He admits that he has fall turned into winter his mood typically worsens.  It is noticeable from his coworkers.  His affect becomes flat and his anxiety level increases.  He reports that at age 14 he was hospitalized in the behavioral health hospital in [place] due to anxiety.  Chart review shows that he was put on Wellbutrin 2017 for similar related symptoms.  He said that it did help him.  He has limited insight.  I reviewed all ER notes and imaging studies.  He had a normal neck CT, normal neck x-rays, normal lab work.  Normal EKG.  He has a positive GAD and depression screen. Assessment  1. GAD (generalized anxiety disorder)   2. Seasonal affective disorder ([place])      Plan   Mood disorder: Likely with seasonal affective disorder and/or generalized anxiety disorder or other.  Start Wellbutrin.  Counseling done.  Today's visit was 30 minutes long. Greater than 50% of this time was devoted to face to face counseling with the patient and coordination of care. We discussed his diagnosis, prognosis, treatment options and treatment plan is documented above.   Flu shot today  Follow up: Return in about 4 weeks (around 05/21/2018) for mood follow up.   No orders of the defined types were placed in  this encounter.  Meds ordered this encounter  Medications  . buPROPion (WELLBUTRIN SR) 150 MG 12 hr tablet    Sig: Take 1 tablet (150 mg total) by mouth 2 (two) times daily.    Dispense:  60 tablet    Refill:  2      I reviewed the patients updated PMH, FH, and SocHx.    Patient Active Problem List   Diagnosis Date Noted  . GAD (generalized anxiety disorder) 04/23/2018  . Seasonal affective disorder ([place]) 04/23/2018  . Overweight (BMI 25.0-29.9) 09/05/2017   Current Meds  Medication Sig  . calcium carbonate (TUMS - DOSED IN MG ELEMENTAL CALCIUM) 500 MG chewable tablet Chew 2 tablets by mouth daily as needed for indigestion or heartburn.  . [DISCONTINUED] Carboxymethylcellulose Sodium (EYE DROPS OP) Place 2-3 drops into both eyes daily as needed (dry eyes).  . [DISCONTINUED] cyclobenzaprine (FLEXERIL) 10 MG tablet TK 1 T PO AT BEDTIME PRN FOR MUSCLE SPASMS  . [DISCONTINUED] omeprazole (PRILOSEC) 20 MG capsule Take 2 capsules (40 mg total) by mouth daily.    Allergies: Patient has No Known Allergies. Family History: Patient family history includes Breast cancer in his mother; Diabetes in his brother; Healthy in his daughter, son, son, and son; Hyperlipidemia in his mother; Hypertension in his brother and mother; Kidney disease in his father; Prostate cancer in  his father; Throat cancer in his brother. Social History:  Patient  reports that he has never smoked. He has never used smokeless tobacco. He reports that he does not drink alcohol or use drugs.  Review of Systems: Constitutional: Negative for fever malaise or anorexia Cardiovascular: negative for chest pain Respiratory: negative for SOB or persistent cough Gastrointestinal: negative for abdominal pain  Objective  Vitals: BP 138/80   Pulse 87   Temp 97.7 F (36.5 C)   Ht 5\' 8"  (1.727 m)   Wt 182 lb 6.4 oz (82.7 kg)   SpO2 98%   BMI 27.73 kg/m  General: no acute distress , A&Ox3 Psych: Flat affect, limited  insight, normal speech, normal cognition    Commons side effects, risks, benefits, and alternatives for medications and treatment plan prescribed today were discussed, and the patient expressed understanding of the given instructions. Patient is instructed to call or message via MyChart if he/she has any questions or concerns regarding our treatment plan. No barriers to understanding were identified. We discussed Red Flag symptoms and signs in detail. Patient expressed understanding regarding what to do in case of urgent or emergency type symptoms.   Medication list was reconciled, printed and provided to the patient in AVS. Patient instructions and summary information was reviewed with the patient as documented in the AVS. This note was prepared with assistance of Dragon voice recognition software. Occasional wrong-word or sound-a-like substitutions may have occurred due to the inherent limitations of voice recognition software

## 2018-04-23 NOTE — Patient Instructions (Addendum)
Please return in 4 weeks to recheck mood  If you have any questions or concerns, please don't hesitate to send me a message via MyChart or call the office at [phone]. Thank you for visiting with [place] today! It's our pleasure caring for you.   Generalized Anxiety Disorder, Adult Generalized anxiety disorder (GAD) is a mental health disorder. People with this condition constantly worry about everyday events. Unlike normal anxiety, worry related to GAD is not triggered by a specific event. These worries also do not fade or get better with time. GAD interferes with life functions, including relationships, work, and school. GAD can vary from mild to severe. People with severe GAD can have intense waves of anxiety with physical symptoms (panic attacks). What are the causes? The exact cause of GAD is not known. What increases the risk? This condition is more likely to develop in:  Women.  People who have a family history of anxiety disorders.  People who are very shy.  People who experience very stressful life events, such as the death of a loved one.  People who have a very stressful family environment.  What are the signs or symptoms? People with GAD often worry excessively about many things in their lives, such as their health and family. They may also be overly concerned about:  Doing well at work.  Being on time.  Natural disasters.  Friendships.  Physical symptoms of GAD include:  Fatigue.  Muscle tension or having muscle twitches.  Trembling or feeling shaky.  Being easily startled.  Feeling like your heart is pounding or racing.  Feeling out of breath or like you cannot take a deep breath.  Having trouble falling asleep or staying asleep.  Sweating.  Nausea, diarrhea, or irritable bowel syndrome (IBS).  Headaches.  Trouble concentrating or remembering facts.  Restlessness.  Irritability.  How is this diagnosed? Your health care provider can diagnose  GAD based on your symptoms and medical history. You will also have a physical exam. The health care provider will ask specific questions about your symptoms, including how severe they are, when they started, and if they come and go. Your health care provider may ask you about your use of alcohol or drugs, including prescription medicines. Your health care provider may refer you to a mental health specialist for further evaluation. Your health care provider will do a thorough examination and may perform additional tests to rule out other possible causes of your symptoms. To be diagnosed with GAD, a person must have anxiety that:  Is out of his or her control.  Affects several different aspects of his or her life, such as work and relationships.  Causes distress that makes him or her unable to take part in normal activities.  Includes at least three physical symptoms of GAD, such as restlessness, fatigue, trouble concentrating, irritability, muscle tension, or sleep problems.  Before your health care provider can confirm a diagnosis of GAD, these symptoms must be present more days than they are not, and they must last for six months or longer. How is this treated? The following therapies are usually used to treat GAD:  Medicine. Antidepressant medicine is usually prescribed for long-term daily control. Antianxiety medicines may be added in severe cases, especially when panic attacks occur.  Talk therapy (psychotherapy). Certain types of talk therapy can be helpful in treating GAD by providing support, education, and guidance. Options include: ? Cognitive behavioral therapy (CBT). People learn coping skills and techniques to ease their anxiety. They  learn to identify unrealistic or negative thoughts and behaviors and to replace them with positive ones. ? Acceptance and commitment therapy (ACT). This treatment teaches people how to be mindful as a way to cope with unwanted thoughts and  feelings. ? Biofeedback. This process trains you to manage your body's response (physiological response) through breathing techniques and relaxation methods. You will work with a therapist while machines are used to monitor your physical symptoms.  Stress management techniques. These include yoga, meditation, and exercise.  A mental health specialist can help determine which treatment is best for you. Some people see improvement with one type of therapy. However, other people require a combination of therapies. Follow these instructions at home:  Take over-the-counter and prescription medicines only as told by your health care provider.  Try to maintain a normal routine.  Try to anticipate stressful situations and allow extra time to manage them.  Practice any stress management or self-calming techniques as taught by your health care provider.  Do not punish yourself for setbacks or for not making progress.  Try to recognize your accomplishments, even if they are small.  Keep all follow-up visits as told by your health care provider. This is important. Contact a health care provider if:  Your symptoms do not get better.  Your symptoms get worse.  You have signs of depression, such as: ? A persistently sad, cranky, or irritable mood. ? Loss of enjoyment in activities that used to bring you joy. ? Change in weight or eating. ? Changes in sleeping habits. ? Avoiding friends or family members. ? Loss of energy for normal tasks. ? Feelings of guilt or worthlessness. Get help right away if:  You have serious thoughts about hurting yourself or others. If you ever feel like you may hurt yourself or others, or have thoughts about taking your own life, get help right away. You can go to your nearest emergency department or call:  Your local emergency services (911 in the [place]  A suicide crisis helpline, such as the [facility] at 480-419-3864. This is open  24 hours a day.  Summary  Generalized anxiety disorder (GAD) is a mental health disorder that involves worry that is not triggered by a specific event.  People with GAD often worry excessively about many things in their lives, such as their health and family.  GAD may cause physical symptoms such as restlessness, trouble concentrating, sleep problems, frequent sweating, nausea, diarrhea, headaches, and trembling or muscle twitching.  A mental health specialist can help determine which treatment is best for you. Some people see improvement with one type of therapy. However, other people require a combination of therapies. This information is not intended to replace advice given to you by your health care provider. Make sure you discuss any questions you have with your health care provider. Document Released: 10/25/2012 Document Revised: 05/20/2016 Document Reviewed: 05/20/2016 Elsevier Interactive Patient Education  2018 [employer] and Stress Management Stress is a normal reaction to life events. It is what you feel when life demands more than you are used to or more than you can handle. Some stress can be useful. For example, the stress reaction can help you catch the last bus of the day, study for a test, or meet a deadline at work. But stress that occurs too often or for too long can cause problems. It can affect your emotional health and interfere with relationships and normal daily activities. Too much stress can  weaken your immune system and increase your risk for physical illness. If you already have a medical problem, stress can make it worse. What are the causes? All sorts of life events may cause stress. An event that causes stress for one person may not be stressful for another person. Major life events commonly cause stress. These may be positive or negative. Examples include losing your job, moving into a new home, getting married, having a baby, or losing a loved one.  Less obvious life events may also cause stress, especially if they occur day after day or in combination. Examples include working long hours, driving in traffic, caring for children, being in debt, or being in a difficult relationship. What are the signs or symptoms? Stress may cause emotional symptoms including, the following:  Anxiety. This is feeling worried, afraid, on edge, overwhelmed, or out of control.  Anger. This is feeling irritated or impatient.  Depression. This is feeling sad, down, helpless, or guilty.  Difficulty focusing, remembering, or making decisions.  Stress may cause physical symptoms, including the following:  Aches and pains. These may affect your head, neck, back, stomach, or other areas of your body.  Tight muscles or clenched jaw.  Low energy or trouble sleeping.  Stress may cause unhealthy behaviors, including the following:  Eating to feel better (overeating) or skipping meals.  Sleeping too little, too much, or both.  Working too much or putting off tasks (procrastination).  Smoking, drinking alcohol, or using drugs to feel better.  How is this diagnosed? Stress is diagnosed through an assessment by your health care provider. Your health care provider will ask questions about your symptoms and any stressful life events.Your health care provider will also ask about your medical history and may order blood tests or other tests. Certain medical conditions and medicine can cause physical symptoms similar to stress. Mental illness can cause emotional symptoms and unhealthy behaviors similar to stress. Your health care provider may refer you to a mental health professional for further evaluation. How is this treated? Stress management is the recommended treatment for stress.The goals of stress management are reducing stressful life events and coping with stress in healthy ways. Techniques for reducing stressful life events include the following:  Stress  identification. Self-monitor for stress and identify what causes stress for you. These skills may help you to avoid some stressful events.  Time management. Set your priorities, keep a calendar of events, and learn to say "no." These tools can help you avoid making too many commitments.  Techniques for coping with stress include the following:  Rethinking the problem. Try to think realistically about stressful events rather than ignoring them or overreacting. Try to find the positives in a stressful situation rather than focusing on the negatives.  Exercise. Physical exercise can release both physical and emotional tension. The key is to find a form of exercise you enjoy and do it regularly.  Relaxation techniques. These relax the body and mind. Examples include yoga, meditation, tai chi, biofeedback, deep breathing, progressive muscle relaxation, listening to music, being out in nature, journaling, and other hobbies. Again, the key is to find one or more that you enjoy and can do regularly.  Healthy lifestyle. Eat a balanced diet, get plenty of sleep, and do not smoke. Avoid using alcohol or drugs to relax.  Strong support network. Spend time with family, friends, or other people you enjoy being around.Express your feelings and talk things over with someone you trust.  Counseling or talktherapy  with a mental health professional may be helpful if you are having difficulty managing stress on your own. Medicine is typically not recommended for the treatment of stress.Talk to your health care provider if you think you need medicine for symptoms of stress. Follow these instructions at home:  Keep all follow-up visits as directed by your health care provider.  Take all medicines as directed by your health care provider. Contact a health care provider if:  Your symptoms get worse or you start having new symptoms.  You feel overwhelmed by your problems and can no longer manage them on your  own. Get help right away if:  You feel like hurting yourself or someone else. This information is not intended to replace advice given to you by your health care provider. Make sure you discuss any questions you have with your health care provider. Document Released: 12/24/2000 Document Revised: 12/06/2015 Document Reviewed: 02/22/2013 Elsevier Interactive Patient Education  2017 [employer].

## 2018-04-27 ENCOUNTER — Other Ambulatory Visit: Payer: Self-pay | Admitting: Orthopedic Surgery

## 2018-04-27 DIAGNOSIS — M7711 Lateral epicondylitis, right elbow: Secondary | ICD-10-CM

## 2018-05-07 ENCOUNTER — Ambulatory Visit: Payer: Commercial Managed Care - PPO | Admitting: Family Medicine

## 2018-05-07 ENCOUNTER — Other Ambulatory Visit: Payer: Self-pay

## 2018-05-07 ENCOUNTER — Other Ambulatory Visit: Payer: Commercial Managed Care - PPO

## 2018-05-07 ENCOUNTER — Encounter: Payer: Self-pay | Admitting: Family Medicine

## 2018-05-07 VITALS — BP 122/82 | HR 82 | Temp 98.2°F | Ht 68.0 in | Wt 184.6 lb

## 2018-05-07 DIAGNOSIS — D751 Secondary polycythemia: Secondary | ICD-10-CM | POA: Diagnosis not present

## 2018-05-07 DIAGNOSIS — F411 Generalized anxiety disorder: Secondary | ICD-10-CM | POA: Diagnosis not present

## 2018-05-07 DIAGNOSIS — R799 Abnormal finding of blood chemistry, unspecified: Secondary | ICD-10-CM

## 2018-05-07 DIAGNOSIS — R779 Abnormality of plasma protein, unspecified: Secondary | ICD-10-CM

## 2018-05-07 DIAGNOSIS — M542 Cervicalgia: Secondary | ICD-10-CM | POA: Diagnosis not present

## 2018-05-07 LAB — CBC WITH DIFFERENTIAL/PLATELET
BASOS PCT: 1.4 % (ref 0.0–3.0)
Basophils Absolute: 0.1 10*3/uL (ref 0.0–0.1)
EOS PCT: 3.4 % (ref 0.0–5.0)
Eosinophils Absolute: 0.2 10*3/uL (ref 0.0–0.7)
HEMATOCRIT: 53.1 % — AB (ref 39.0–52.0)
HEMOGLOBIN: 17.7 g/dL — AB (ref 13.0–17.0)
LYMPHS PCT: 52.1 % — AB (ref 12.0–46.0)
Lymphs Abs: 3 10*3/uL (ref 0.7–4.0)
MCHC: 33.4 g/dL (ref 30.0–36.0)
MCV: 88 fl (ref 78.0–100.0)
Monocytes Absolute: 0.4 10*3/uL (ref 0.1–1.0)
Monocytes Relative: 7.4 % (ref 3.0–12.0)
Neutro Abs: 2.1 10*3/uL (ref 1.4–7.7)
Neutrophils Relative %: 35.7 % — ABNORMAL LOW (ref 43.0–77.0)
Platelets: 248 10*3/uL (ref 150.0–400.0)
RBC: 6.03 Mil/uL — AB (ref 4.22–5.81)
RDW: 15 % (ref 11.5–15.5)
WBC: 5.8 10*3/uL (ref 4.0–10.5)

## 2018-05-07 LAB — POCT URINALYSIS DIPSTICK
Bilirubin, UA: NEGATIVE
Glucose, UA: NEGATIVE
Ketones, UA: NEGATIVE
LEUKOCYTES UA: NEGATIVE
Nitrite, UA: NEGATIVE
PH UA: 6 (ref 5.0–8.0)
Protein, UA: POSITIVE — AB
RBC UA: NEGATIVE
Spec Grav, UA: 1.015 (ref 1.010–1.025)
UROBILINOGEN UA: 0.2 U/dL

## 2018-05-07 LAB — COMPREHENSIVE METABOLIC PANEL
ALBUMIN: 4.7 g/dL (ref 3.5–5.2)
ALT: 32 U/L (ref 0–53)
AST: 33 U/L (ref 0–37)
Alkaline Phosphatase: 78 U/L (ref 39–117)
BUN: 15 mg/dL (ref 6–23)
CALCIUM: 9.8 mg/dL (ref 8.4–10.5)
CHLORIDE: 103 meq/L (ref 96–112)
CO2: 29 mEq/L (ref 19–32)
Creatinine, Ser: 1.1 mg/dL (ref 0.40–1.50)
GFR: 91.08 mL/min (ref >60.00)
Glucose, Bld: 88 mg/dL (ref 70–99)
POTASSIUM: 4.2 meq/L (ref 3.5–5.1)
Sodium: 140 mEq/L (ref 135–145)
Total Bilirubin: 0.8 mg/dL (ref 0.2–1.2)
Total Protein: 7.5 g/dL (ref 6.0–8.3)

## 2018-05-07 MED ORDER — AMITRIPTYLINE HCL 25 MG PO TABS: 25.0000 mg | ORAL_TABLET | Freq: Every day | ORAL | 2 refills | Status: DC

## 2018-05-07 NOTE — Patient Instructions (Addendum)
Please return for next visit as scheduled.   I am checking more blood work and urine tests to ensure that things are ok. I will let you know about the results .  Start taking the elavil at night to see if that improves your neck burning pain. Continue your elavil.   If you have any questions or concerns, please don't hesitate to send me a message via MyChart or call the office at [phone]. Thank you for visiting with [place] today! It's our pleasure caring for you.

## 2018-05-07 NOTE — Progress Notes (Signed)
Subjective  CC:  Chief Complaint  Patient presents with  . Neck Pain    still having problems with his neck, he states that his neck is burning     HPI: Jacob Foster is a 52 y.o. male who presents to the office today to address the problems listed above in the chief complaint.  Rod returns due to persistent neck pain described more as burning on the right side.  He feels that at times it swollen.  See last 2 visits for further description.  Normal x-rays were done.  No radicular symptoms.  No injury.  Also complains of right wrist pain and left knee pain.  Last visit we discussed somatic complaints related to chronic anxiety and seasonal affective disorder.  Since, he started on Wellbutrin and is tolerating it well.  Not yet helping as it only been a few weeks.  He continues to worry that something is seriously wrong and that he could die.  He denies panic attacks.  Chart reviewed shows hemoglobin elevation and protein elevation over the last year or so.  He denies low back pain.  He denies shortness of breath Assessment  1. Neck pain   2. GAD (generalized anxiety disorder)   3. Polycythemia   4. Elevated serum protein level      Plan   Neck pain: Most consistent with somatic complaints although could be related to neuropathic pain start Elavil nightly.  Defer imaging at this point since no radicular symptoms but can consider CT or MRI in the future if persists  General anxiety disorder: Possibly related to somatic complaints.  Continue Wellbutrin and recheck in 2 weeks.  Counseling done  Polycythemia: Start evaluation with lab work, peripheral smear.  Consider referral to hematology  Elevated serum protein level and most recent lab test, start work-up for multiple myeloma.  Educated, doing further work-up to follow-up on mild abnormalities noted in blood test results to be thorough.  Follow up: As scheduled  Orders Placed This Encounter  Procedures  . Comprehensive  metabolic panel  . CBC with Differential/Platelet  . Iron, TIBC and Ferritin Panel  . Erythropoietin  . Protein Electrophoresis, Urine Rflx.  Dema Severin Cell Differential  . POCT urinalysis dipstick   Meds ordered this encounter  Medications  . amitriptyline (ELAVIL) 25 MG tablet    Sig: Take 1 tablet (25 mg total) by mouth at bedtime.    Dispense:  30 tablet    Refill:  2      I reviewed the patients updated PMH, FH, and SocHx.    Patient Active Problem List   Diagnosis Date Noted  . GAD (generalized anxiety disorder) 04/23/2018  . Seasonal affective disorder ([place]) 04/23/2018  . Overweight (BMI 25.0-29.9) 09/05/2017   Current Meds  Medication Sig  . buPROPion (WELLBUTRIN SR) 150 MG 12 hr tablet Take 1 tablet (150 mg total) by mouth 2 (two) times daily.  . calcium carbonate (TUMS - DOSED IN MG ELEMENTAL CALCIUM) 500 MG chewable tablet Chew 2 tablets by mouth daily as needed for indigestion or heartburn.  Marland Kitchen ibuprofen (ADVIL,MOTRIN) 200 MG tablet Take 200 mg by mouth every 6 (six) hours as needed.    Allergies: Patient has No Known Allergies. Family History: Patient family history includes Breast cancer in his mother; Diabetes in his brother; Healthy in his daughter, son, son, and son; Hyperlipidemia in his mother; Hypertension in his brother and mother; Kidney disease in his father; Prostate cancer in his father; Throat cancer in  his brother. Social History:  Patient  reports that he has never smoked. He has never used smokeless tobacco. He reports that he does not drink alcohol or use drugs.  Review of Systems: Constitutional: Negative for fever malaise or anorexia Cardiovascular: negative for chest pain Respiratory: negative for SOB or persistent cough Gastrointestinal: negative for abdominal pain  Objective  Vitals: BP 122/82   Pulse 82   Temp 98.2 F (36.8 C)   Ht _0  (1.727 m)   Wt 184 lb 9.6 oz (83.7 kg)   SpO2 97%   BMI 28.07 kg/m  General: no acute  distress , A&Ox3 HEENT: PEERL, conjunctiva normal, Oropharynx moist,neck is supple, nontender Cardiovascular:  RRR without murmur or gallop.  Respiratory:  Good breath sounds bilaterally, CTAB with normal respiratory effort Skin:  Warm, no rashes  No visits with results within 1 Day(s) from this visit.  Latest known visit with results is:  Admission on 04/05/2018, Discharged on 04/05/2018  Component Date Value Ref Range Status  . Lipase 04/05/2018 32  11 - 51 U/L Final  . Sodium 04/05/2018 140  135 - 145 mmol/L Final  . Potassium 04/05/2018 4.2  3.5 - 5.1 mmol/L Final  . Chloride 04/05/2018 105  98 - 111 mmol/L Final  . CO2 04/05/2018 26  22 - 32 mmol/L Final  . Glucose, Bld 04/05/2018 83  70 - 99 mg/dL Final  . BUN 04/05/2018 16  6 - 20 mg/dL Final  . Creatinine, Ser 04/05/2018 1.06  0.61 - 1.24 mg/dL Final  . Calcium 04/05/2018 9.4  8.9 - 10.3 mg/dL Final  . Total Protein 04/05/2018 8.5* 6.5 - 8.1 g/dL Final  . Albumin 04/05/2018 4.7  3.5 - 5.0 g/dL Final  . AST 04/05/2018 24  15 - 41 U/L Final  . ALT 04/05/2018 28  0 - 44 U/L Final  . Alkaline Phosphatase 04/05/2018 69  38 - 126 U/L Final  . Total Bilirubin 04/05/2018 1.1  0.3 - 1.2 mg/dL Final  . GFR calc non Af Amer 04/05/2018 >60  >60 mL/min Final  . GFR calc Af Amer 04/05/2018 >60  >60 mL/min Final  . Anion gap 04/05/2018 9  5 - 15 Final  . WBC 04/05/2018 6.7  4.0 - 10.5 K/uL Final  . RBC 04/05/2018 6.35* 4.22 - 5.81 MIL/uL Final  . Hemoglobin 04/05/2018 18.7* 13.0 - 17.0 g/dL Final  . HCT 04/05/2018 55.1* 39.0 - 52.0 % Final  . MCV 04/05/2018 86.8  78.0 - 100.0 fL Final  . MCH 04/05/2018 29.4  26.0 - 34.0 pg Final  . MCHC 04/05/2018 33.9  30.0 - 36.0 g/dL Final  . RDW 04/05/2018 14.2  11.5 - 15.5 % Final  . Platelets 04/05/2018 239  150 - 400 K/uL Final       Commons side effects, risks, benefits, and alternatives for medications and treatment plan prescribed today were discussed, and the patient expressed  understanding of the given instructions. Patient is instructed to call or message via MyChart if he/she has any questions or concerns regarding our treatment plan. No barriers to understanding were identified. We discussed Red Flag symptoms and signs in detail. Patient expressed understanding regarding what to do in case of urgent or emergency type symptoms.   Medication list was reconciled, printed and provided to the patient in AVS. Patient instructions and summary information was reviewed with the patient as documented in the AVS. This note was prepared with assistance of Dragon voice recognition software. Occasional wrong-word or sound-a-like  substitutions may have occurred due to the inherent limitations of voice recognition software

## 2018-05-10 LAB — PROTEIN ELECTROPHORESIS, URINE REFLEX
ALPHA-2-GLOBULIN, U: 11.7 %
Albumin ELP, Urine: 45.2 %
Alpha-1-Globulin, U: 4.8 %
BETA GLOBULIN, U: 21 %
Gamma Globulin, U: 17.3 %
Protein, Ur: 12.2 mg/dL

## 2018-05-10 LAB — ERYTHROPOIETIN: Erythropoietin: 16.6 m[IU]/mL (ref 2.6–18.5)

## 2018-05-10 LAB — PATHOLOGIST SMEAR REVIEW

## 2018-05-10 LAB — IRON,TIBC AND FERRITIN PANEL
%SAT: 20 % (ref 20–48)
FERRITIN: 31 ng/mL — AB (ref 38–380)
Iron: 77 ug/dL (ref 50–180)
TIBC: 380 mcg/dL (calc) (ref 250–425)

## 2018-05-11 NOTE — Progress Notes (Signed)
Please call patient: I have reviewed his/her lab results. Test results look good. Kidneys, liver, protein and urine testing is all ok. He has a mild elevation in his hgb. I will monitor this and if worsens, will need further evaluation, but current eval is normal. These will not explain his sxs. F/u as directed

## 2018-05-28 ENCOUNTER — Encounter: Payer: Self-pay | Admitting: Family Medicine

## 2018-05-28 ENCOUNTER — Ambulatory Visit: Payer: Commercial Managed Care - PPO | Admitting: Family Medicine

## 2018-05-28 ENCOUNTER — Other Ambulatory Visit: Payer: Self-pay

## 2018-05-28 VITALS — BP 128/70 | HR 66 | Temp 97.9°F | Wt 187.0 lb

## 2018-05-28 DIAGNOSIS — M542 Cervicalgia: Secondary | ICD-10-CM | POA: Diagnosis not present

## 2018-05-28 DIAGNOSIS — F338 Other recurrent depressive disorders: Secondary | ICD-10-CM | POA: Diagnosis not present

## 2018-05-28 DIAGNOSIS — F411 Generalized anxiety disorder: Secondary | ICD-10-CM | POA: Diagnosis not present

## 2018-05-28 DIAGNOSIS — D751 Secondary polycythemia: Secondary | ICD-10-CM

## 2018-05-28 DIAGNOSIS — Z1211 Encounter for screening for malignant neoplasm of colon: Secondary | ICD-10-CM

## 2018-05-28 MED ORDER — AMITRIPTYLINE HCL 25 MG PO TABS: 12.5000 mg | ORAL_TABLET | Freq: Every evening | ORAL | 2 refills | Status: DC | PRN

## 2018-05-28 NOTE — Patient Instructions (Addendum)
Please return in 3 months for mood recheck. Sooner if you are having problems.   You may use the elavil only as needed for sleep. You can cut the pill in half if you'd like.   Continue the wellbutrin twice a day.  Consider purchasing fluorescent lights that help treat seasonal affective disorder.   Do 3 sets of 12 - straight leg lifts as demonstrated in the office.   We will call you with information regarding your referral appointment. Stomach doctor to set up your colonoscopy.  If you do not hear from [place] within the next 2 weeks, please let me know. It can take 1-2 weeks to get appointments set up with the specialists.   If you have any questions or concerns, please don't hesitate to send me a message via MyChart or call the office at [phone]. Thank you for visiting with [place] today! It's our pleasure caring for you.

## 2018-05-28 NOTE — Progress Notes (Signed)
Subjective  CC:  Chief Complaint  Patient presents with  . Anxiety    Patient states his anxiety is getting better, doing well on medications  . Neck Pain    still notices neck pain, but better than before     HPI: Jacob Foster is a 52 y.o. male who presents to the office today to address the problems listed above in the chief complaint.  F/u GAD and seasonal affective disorder: started wellbutrin about a month ago: doing better now. Less anxious, less worried. No longer panicked about health conditions. Happier. No AEs.   Neck pain: no longer having burning neck pain; taking elavil at night "makes me sleepy".   ROS - mild knee ache and wrist ache intermittently  Polycythemia: mild. Reviewed lab results.    Assessment  1. GAD (generalized anxiety disorder)   2. Neck pain   3. Polycythemia   4. Seasonal affective disorder ([place])   5. Colon cancer screening      Plan   GAD/SAD:  Improving. More counseling done. Continue meds. Recheck 3 months  Neck pain: resolved. Suspect mostly somatic. May change elavil to prn.   Discussed healthy behaviors for GAD/SAD: walking, eating healthy, regular bedtime etc  Refer to GI for routine colonoscopy, crc screening.  Follow up: Return in about 3 months (around 08/28/2018) for mood follow up.   Orders Placed This Encounter  Procedures  . Ambulatory referral to Gastroenterology   Meds ordered this encounter  Medications  . amitriptyline (ELAVIL) 25 MG tablet    Sig: Take 0.5-1 tablets (12.5-25 mg total) by mouth at bedtime as needed for sleep.    Dispense:  30 tablet    Refill:  2      I reviewed the patients updated PMH, FH, and SocHx.    Patient Active Problem List   Diagnosis Date Noted  . GAD (generalized anxiety disorder) 04/23/2018  . Seasonal affective disorder ([place]) 04/23/2018  . Overweight (BMI 25.0-29.9) 09/05/2017   Current Meds  Medication Sig  . amitriptyline (ELAVIL) 25 MG tablet Take 0.5-1 tablets (12.5-25  mg total) by mouth at bedtime as needed for sleep.  Marland Kitchen buPROPion (WELLBUTRIN SR) 150 MG 12 hr tablet Take 1 tablet (150 mg total) by mouth 2 (two) times daily.  . calcium carbonate (TUMS - DOSED IN MG ELEMENTAL CALCIUM) 500 MG chewable tablet Chew 2 tablets by mouth daily as needed for indigestion or heartburn.  Marland Kitchen ibuprofen (ADVIL,MOTRIN) 200 MG tablet Take 200 mg by mouth every 6 (six) hours as needed.  . [DISCONTINUED] amitriptyline (ELAVIL) 25 MG tablet Take 1 tablet (25 mg total) by mouth at bedtime.    Allergies: Patient has No Known Allergies. Family History: Patient family history includes Breast cancer in his mother; Diabetes in his brother; Healthy in his daughter, son, son, and son; Hyperlipidemia in his mother; Hypertension in his brother and mother; Kidney disease in his father; Prostate cancer in his father; Throat cancer in his brother.   Social History:  Patient  reports that he has never smoked. He has never used smokeless tobacco. He reports that he does not drink alcohol or use drugs.  Review of Systems: Constitutional: Negative for fever malaise or anorexia Cardiovascular: negative for chest pain Respiratory: negative for SOB or persistent cough Gastrointestinal: negative for abdominal pain  Objective  Vitals: BP 128/70   Pulse 66   Temp 97.9 F (36.6 C)   Wt 187 lb (84.8 kg)   SpO2 98%   BMI 28.43  kg/m  General: no acute distress , A&Ox3 Psych: smiling and happy today. Calm appearing, improved insight HEENT: PEERL, conjunctiva normal, Oropharynx moist,neck is supple Cardiovascular:  RRR without murmur or gallop.  Respiratory:  Good breath sounds bilaterally, CTAB with normal respiratory effort Skin:  Warm, no rashes Nl knee exam     Commons side effects, risks, benefits, and alternatives for medications and treatment plan prescribed today were discussed, and the patient expressed understanding of the given instructions. Patient is instructed to call or  message via MyChart if he/she has any questions or concerns regarding our treatment plan. No barriers to understanding were identified. We discussed Red Flag symptoms and signs in detail. Patient expressed understanding regarding what to do in case of urgent or emergency type symptoms.   Medication list was reconciled, printed and provided to the patient in AVS. Patient instructions and summary information was reviewed with the patient as documented in the AVS. This note was prepared with assistance of Dragon voice recognition software. Occasional wrong-word or sound-a-like substitutions may have occurred due to the inherent limitations of voice recognition software

## 2018-06-18 ENCOUNTER — Encounter: Payer: Self-pay | Admitting: Gastroenterology

## 2018-07-02 ENCOUNTER — Telehealth: Payer: Self-pay | Admitting: Family Medicine

## 2018-07-02 NOTE — Telephone Encounter (Signed)
Copied from [place] [id number]. Topic: Quick Communication - See Telephone Encounter >> Jul 02, 2018  4:30 PM Vernona Rieger wrote: CRM for notification. See Telephone encounter for: 07/02/18.  Pt is concerned about his lab results from 10/25 and wants to speak to Dr Jonni Sanger. Please contact patient.

## 2018-07-05 NOTE — Telephone Encounter (Signed)
Spoke with pt, he was interested in getting results. Results given and pt verbalized understanding.

## 2018-07-19 ENCOUNTER — Ambulatory Visit (AMBULATORY_SURGERY_CENTER): Payer: Self-pay | Admitting: *Deleted

## 2018-07-19 VITALS — Ht 68.0 in | Wt 187.0 lb

## 2018-07-19 DIAGNOSIS — Z800 Family history of malignant neoplasm of digestive organs: Secondary | ICD-10-CM

## 2018-07-19 MED ORDER — NA SULFATE-K SULFATE-MG SULF 17.5-3.13-1.6 GM/177ML PO SOLN: 1.0000 | Freq: Once | ORAL | 0 refills | Status: AC

## 2018-07-19 NOTE — Progress Notes (Signed)
No egg or soy allergy known to patient  No issues with past sedation with any surgeries  or procedures, no intubation problems  No diet pills per patient No home 02 use per patient  No blood thinners per patient  Pt denies issues with constipation  No A fib or A flutter  EMMI video sent to pt's e mail   Suprep $15 coupon to pt in [place] today    

## 2018-07-21 ENCOUNTER — Encounter: Payer: Self-pay | Admitting: Gastroenterology

## 2018-07-30 ENCOUNTER — Encounter: Payer: Self-pay | Admitting: Gastroenterology

## 2018-07-30 ENCOUNTER — Ambulatory Visit (AMBULATORY_SURGERY_CENTER): Payer: Commercial Managed Care - PPO | Admitting: Gastroenterology

## 2018-07-30 VITALS — BP 118/73 | HR 63 | Temp 98.9°F | Resp 15 | Ht 68.0 in | Wt 187.0 lb

## 2018-07-30 DIAGNOSIS — Z1211 Encounter for screening for malignant neoplasm of colon: Secondary | ICD-10-CM

## 2018-07-30 DIAGNOSIS — K635 Polyp of colon: Secondary | ICD-10-CM

## 2018-07-30 DIAGNOSIS — D123 Benign neoplasm of transverse colon: Secondary | ICD-10-CM

## 2018-07-30 DIAGNOSIS — Z800 Family history of malignant neoplasm of digestive organs: Secondary | ICD-10-CM

## 2018-07-30 DIAGNOSIS — D120 Benign neoplasm of cecum: Secondary | ICD-10-CM | POA: Diagnosis not present

## 2018-07-30 DIAGNOSIS — D127 Benign neoplasm of rectosigmoid junction: Secondary | ICD-10-CM | POA: Diagnosis not present

## 2018-07-30 MED ORDER — SODIUM CHLORIDE 0.9 % IV SOLN: 500.0000 mL | INTRAVENOUS | Status: DC

## 2018-07-30 NOTE — Progress Notes (Signed)
Pt's states no medical or surgical changes since previsit or office visit. 

## 2018-07-30 NOTE — Op Note (Signed)
[place] Patient Name: Jacob Foster Procedure Date: 07/30/2018 11:02 AM MRN: [id number] Endoscopist: Remo Lipps P. Havery Moros , MD Age: 53 Referring MD:  Date of Birth: June 14, 1966 Gender: Male Account #: [id number] Procedure:                Colonoscopy Indications:              Screening patient at increased risk: Family history                            of colorectal cancer in multiple 1st-degree                            relatives (2 brothers with CRC - one diagnosed age                            87, father diagnosed age 47) Medicines:                Monitored Anesthesia Care Procedure:                Pre-Anesthesia Assessment:                           - Prior to the procedure, a History and Physical                            was performed, and patient medications and                            allergies were reviewed. The patient's tolerance of                            previous anesthesia was also reviewed. The risks                            and benefits of the procedure and the sedation                            options and risks were discussed with the patient.                            All questions were answered, and informed consent                            was obtained. Prior Anticoagulants: The patient has                            taken no previous anticoagulant or antiplatelet                            agents. ASA Grade Assessment: II - A patient with                            mild systemic disease. After reviewing the risks  and benefits, the patient was deemed in                            satisfactory condition to undergo the procedure.                           After obtaining informed consent, the colonoscope                            was passed under direct vision. Throughout the                            procedure, the patient's blood pressure, pulse, and                            oxygen saturations were monitored  continuously. The                            Colonoscope was introduced through the anus and                            advanced to the the cecum, identified by                            appendiceal orifice and ileocecal valve. The                            colonoscopy was performed without difficulty. The                            patient tolerated the procedure well. The quality                            of the bowel preparation was good. The ileocecal                            valve, appendiceal orifice, and rectum were                            photographed. Scope In: 11:17:17 AM Scope Out: 11:40:41 AM Scope Withdrawal Time: 0 hours 20 minutes 59 seconds  Total Procedure Duration: 0 hours 23 minutes 24 seconds  Findings:                 The perianal and digital rectal examinations were                            normal.                           A 3 mm polyp was found in the cecum. The polyp was                            sessile. The polyp was removed with a cold snare.  Resection and retrieval were complete.                           A roughly 10 mm polyp was found in the ileocecal                            valve, behind a fold. The polyp was sessile. The                            polyp was removed with a cold snare. Resection and                            retrieval were complete.                           A 4 mm polyp was found in the recto-sigmoid colon.                            The polyp was flat. The polyp was removed with a                            cold snare. Resection and retrieval were complete.                           Internal hemorrhoids were found during retroflexion.                           The exam was otherwise without abnormality. Complications:            No immediate complications. Estimated blood loss:                            Minimal. Estimated Blood Loss:     Estimated blood loss was minimal. Impression:                - One 3 mm polyp in the cecum, removed with a cold                            snare. Resected and retrieved.                           - One 10 mm polyp at the ileocecal valve, removed                            with a cold snare. Resected and retrieved.                           - One 4 mm polyp at the recto-sigmoid colon,                            removed with a cold snare. Resected and retrieved.                           - Internal hemorrhoids.                           -  The examination was otherwise normal. Recommendation:           - Patient has a contact number available for                            emergencies. The signs and symptoms of potential                            delayed complications were discussed with the                            patient. Return to normal activities tomorrow.                            Written discharge instructions were provided to the                            patient.                           - Resume previous diet.                           - Continue present medications.                           - Await pathology results. Remo Lipps P. Armbruster, MD 07/30/2018 11:44:54 AM This report has been signed electronically.

## 2018-07-30 NOTE — Progress Notes (Signed)
PT taken to PACU. Monitors in place. VSS. Report given to RN. 

## 2018-07-30 NOTE — Progress Notes (Signed)
Called to room to assist during endoscopic procedure.  Patient ID and intended procedure confirmed with present staff. Received instructions for my participation in the procedure from the performing physician.  

## 2018-07-30 NOTE — Patient Instructions (Signed)
Impression/Recommendations:  Polyp handout given to patient. Hemorrhoid handout given to patient.  Resume previous diet. Continue present medications.  Await pathology results.  YOU HAD AN ENDOSCOPIC PROCEDURE TODAY AT [facility]:   Refer to the procedure report that was given to you for any specific questions about what was found during the examination.  If the procedure report does not answer your questions, please call your gastroenterologist to clarify.  If you requested that your care partner not be given the details of your procedure findings, then the procedure report has been included in a sealed envelope for you to review at your convenience later.  YOU SHOULD EXPECT: Some feelings of bloating in the abdomen. Passage of more gas than usual.  Walking can help get rid of the air that was put into your GI tract during the procedure and reduce the bloating. If you had a lower endoscopy (such as a colonoscopy or flexible sigmoidoscopy) you may notice spotting of blood in your stool or on the toilet paper. If you underwent a bowel prep for your procedure, you may not have a normal bowel movement for a few days.  Please Note:  You might notice some irritation and congestion in your nose or some drainage.  This is from the oxygen used during your procedure.  There is no need for concern and it should clear up in a day or so.  SYMPTOMS TO REPORT IMMEDIATELY:   Following lower endoscopy (colonoscopy or flexible sigmoidoscopy):  Excessive amounts of blood in the stool  Significant tenderness or worsening of abdominal pains  Swelling of the abdomen that is new, acute  Fever of 100F or higher For urgent or emergent issues, a gastroenterologist can be reached at any hour by calling [phone].   DIET:  We do recommend a small meal at first, but then you may proceed to your regular diet.  Drink plenty of fluids but you should avoid alcoholic beverages for 24  hours.  ACTIVITY:  You should plan to take it easy for the rest of today and you should NOT DRIVE or use heavy machinery until tomorrow (because of the sedation medicines used during the test).    FOLLOW UP: Our staff will call the number listed on your records the next business day following your procedure to check on you and address any questions or concerns that you may have regarding the information given to you following your procedure. If we do not reach you, we will leave a message.  However, if you are feeling well and you are not experiencing any problems, there is no need to return our call.  We will assume that you have returned to your regular daily activities without incident.  If any biopsies were taken you will be contacted by phone or by letter within the next 1-3 weeks.  Please call us at [phone] if you have not heard about the biopsies in 3 weeks.    SIGNATURES/CONFIDENTIALITY: You and/or your care partner have signed paperwork which will be entered into your electronic medical record.  These signatures attest to the fact that that the information above on your After Visit Summary has been reviewed and is understood.  Full responsibility of the confidentiality of this discharge information lies with you and/or your care-partner.

## 2018-08-02 ENCOUNTER — Telehealth: Payer: Self-pay

## 2018-08-02 NOTE — Telephone Encounter (Signed)
  Follow up Call-  Call back number 07/30/2018  Post procedure Call Back phone  # [phone]  Permission to leave phone message Yes  Some recent data might be hidden     Patient questions:  Do you have a fever, pain , or abdominal swelling? No. Pain Score  0 *  Have you tolerated food without any problems? Yes.    Have you been able to return to your normal activities? Yes.    Do you have any questions about your discharge instructions: Diet   No. Medications  No. Follow up visit  No.  Do you have questions or concerns about your Care? No.  Actions: * If pain score is 4 or above: No action needed, pain <4.

## 2018-08-04 ENCOUNTER — Encounter: Payer: Self-pay | Admitting: Gastroenterology

## 2018-08-20 ENCOUNTER — Ambulatory Visit: Payer: Commercial Managed Care - PPO | Admitting: Family Medicine

## 2018-08-20 ENCOUNTER — Encounter: Payer: Self-pay | Admitting: Family Medicine

## 2018-08-20 ENCOUNTER — Other Ambulatory Visit: Payer: Self-pay

## 2018-08-20 VITALS — BP 128/82 | HR 73 | Temp 98.2°F | Resp 16 | Ht 68.0 in | Wt 183.0 lb

## 2018-08-20 DIAGNOSIS — H1033 Unspecified acute conjunctivitis, bilateral: Secondary | ICD-10-CM

## 2018-08-20 DIAGNOSIS — K219 Gastro-esophageal reflux disease without esophagitis: Secondary | ICD-10-CM

## 2018-08-20 DIAGNOSIS — R05 Cough: Secondary | ICD-10-CM

## 2018-08-20 DIAGNOSIS — R059 Cough, unspecified: Secondary | ICD-10-CM

## 2018-08-20 DIAGNOSIS — M150 Primary generalized (osteo)arthritis: Secondary | ICD-10-CM

## 2018-08-20 DIAGNOSIS — F411 Generalized anxiety disorder: Secondary | ICD-10-CM | POA: Diagnosis not present

## 2018-08-20 DIAGNOSIS — M8949 Other hypertrophic osteoarthropathy, multiple sites: Secondary | ICD-10-CM

## 2018-08-20 DIAGNOSIS — M7582 Other shoulder lesions, left shoulder: Secondary | ICD-10-CM

## 2018-08-20 DIAGNOSIS — M159 Polyosteoarthritis, unspecified: Secondary | ICD-10-CM

## 2018-08-20 MED ORDER — ERYTHROMYCIN 5 MG/GM OP OINT: 1.0000 "application " | TOPICAL_OINTMENT | Freq: Three times a day (TID) | OPHTHALMIC | 0 refills | Status: DC

## 2018-08-20 MED ORDER — OMEPRAZOLE 20 MG PO CPDR: 20.0000 mg | DELAYED_RELEASE_CAPSULE | Freq: Every day | ORAL | 3 refills | Status: DC

## 2018-08-20 MED ORDER — CETIRIZINE HCL 10 MG PO TABS: 10.0000 mg | ORAL_TABLET | Freq: Every day | ORAL | 11 refills | Status: DC

## 2018-08-20 MED ORDER — DICLOFENAC SODIUM 75 MG PO TBEC: 75.0000 mg | DELAYED_RELEASE_TABLET | Freq: Two times a day (BID) | ORAL | 0 refills | Status: DC

## 2018-08-20 NOTE — Progress Notes (Signed)
Subjective  CC:  Chief Complaint  Patient presents with  . Facial Swelling    He notice this morning that his left eye was swollen  . Nasal Congestion    States that he has had congestion for several weeks.. Has tried Theraflu with minimal relief    HPI: Jacob Foster is a 53 y.o. male who presents to the office today to address the problems listed above in the chief complaint.  Nasal congestion and cold sxs x 2-3 weeks with some sneezing, clear rhinorrhea and awoke with red draining eyes with crusting on eye lashes. No eye pain or visual disturbance. No fevers or sinus pain. No sob.   Cough postprandial: has h/o GERD. Has taken tums in the past. No lung disease. Denies significant PND but not sure.   Left shoulder pain x 2-4 weeks; pain with lifting arm overhead. No more neck pain. No radicular pain or weakness. Uses 400 mg ibuprofen daily for right elbow pain (tennis elbow) that helps shoulder pain as well. 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 Tylenol helps. No hot swollen joints.   Anxiety: stopped wellbutrin a few months ago. Now with pain concerns and worry sxs back again. Sleep is ok. No longer on elavil. See last note.    Assessment  1. Acute bacterial conjunctivitis of both eyes   2. GAD (generalized anxiety disorder)   3. Gastroesophageal reflux disease without esophagitis   4. Cough   5. Rotator cuff tendinitis, left   6. Primary osteoarthritis involving multiple joints      Plan   conjunctivits:  Treat with emycin ointment. Could be viral though.   URI and allergies: start zyrtec nightly. Reassured. Doesn't sound like sinusitis.   GERD and postprandial cough: start PPI daily to see if helps.   RCT: overuse: start Ice voltaren and recheck in 2-3 weeks. Pt declines PT at this time.   OA: education given. Reassured. Tylenol or volatren as needed.   GAD: restart wellbutrin; this should help his worry  and pain as it did in the past. Recheck 2-3 weeks.   Follow up: Return in about 3 weeks (around 09/10/2018) for recheck.  12/02/2018  No orders of the defined types were placed in this encounter.  Meds ordered this encounter  Medications  . diclofenac (VOLTAREN) 75 MG EC tablet    Sig: Take 1 tablet (75 mg total) by mouth 2 (two) times daily.    Dispense:  30 tablet    Refill:  0  . cetirizine (ZYRTEC) 10 MG tablet    Sig: Take 1 tablet (10 mg total) by mouth at bedtime.    Dispense:  30 tablet    Refill:  11  . omeprazole (PRILOSEC) 20 MG capsule    Sig: Take 1 capsule (20 mg total) by mouth daily.    Dispense:  30 capsule    Refill:  3  . erythromycin ophthalmic ointment    Sig: Place 1 application into the left eye 3 (three) times daily.    Dispense:  3.5 g    Refill:  0      I reviewed the patients updated PMH, FH, and SocHx.    Patient Active Problem List   Diagnosis Date Noted  . Gastroesophageal reflux disease without esophagitis 08/20/2018  . GAD (generalized anxiety disorder) 04/23/2018  . Seasonal affective disorder ([place]) 04/23/2018  . Overweight (BMI 25.0-29.9) 09/05/2017   No outpatient  medications have been marked as taking for the 08/20/18 encounter (Office Visit) with Leamon Arnt, MD.    Allergies: Patient has No Known Allergies. Family History: Patient family history includes Breast cancer in his mother; Colon cancer in his father, maternal uncle, and maternal uncle; Colon cancer (age of onset: 98) in his brother; Colon cancer (age of onset: 31) in his brother; Diabetes in his brother; Esophageal cancer in his brother; Healthy in his daughter, son, son, and son; Hyperlipidemia in his mother; Hypertension in his brother and mother; Kidney disease in his father; Prostate cancer in his father; Throat cancer in his brother. Social History:  Patient  reports that he has never smoked. He has never used smokeless tobacco. He reports that he does not drink alcohol  or use drugs.  Review of Systems: Constitutional: Negative for fever malaise or anorexia Cardiovascular: negative for chest pain Respiratory: negative for SOB or persistent cough Gastrointestinal: negative for abdominal pain  Objective  Vitals: BP 128/82   Pulse 73   Temp 98.2 F (36.8 C) (Oral)   Resp 16   Ht 5\' 8"  (1.727 m)   Wt 183 lb (83 kg)   SpO2 97%   BMI 27.83 kg/m  General: no acute distress , A&Ox3 Psych: flat affect HEENT: PEERL, conjunctiva mildly injected bilaterally with superior eyelid swelling w/o redness, warmth or ttp,nasal congestion present. Nl TMs bilaterally, Oropharynx moist,neck is supple, no sinus pain or LAD Cardiovascular:  RRR without murmur or gallop.  Respiratory:  Good breath sounds bilaterally, CTAB with normal respiratory effort Skin:  Warm, no rashes Left shoulder: Full passive ROM, active ROM full but painful above 90degrees, + empty can testing. + anterior ttp. Nl bicep and tricep strength. Neg drop test Left hand with mild distal OA changes, no red warm or swollen joints Nl gait.      Commons side effects, risks, benefits, and alternatives for medications and treatment plan prescribed today were discussed, and the patient expressed understanding of the given instructions. Patient is instructed to call or message via MyChart if he/she has any questions or concerns regarding our treatment plan. No barriers to understanding were identified. We discussed Red Flag symptoms and signs in detail. Patient expressed understanding regarding what to do in case of urgent or emergency type symptoms.   Medication list was reconciled, printed and provided to the patient in AVS. Patient instructions and summary information was reviewed with the patient as documented in the AVS. This note was prepared with assistance of Dragon voice recognition software. Occasional wrong-word or sound-a-like substitutions may have occurred due to the inherent limitations of voice  recognition software

## 2018-08-20 NOTE — Patient Instructions (Signed)
Please return in 2-3 weeks for recheck.   Please stop ibuprofen and take diclofenac twice a day with meals.  Start omeprazole daily to see if this helps your cough.  Start OTC Zyrtec nightly for allergy symptoms.  Use the eye ointment for one week on both eyes.  Restart your wellbutrin, once daily for 3 days, then go to twice a day.     If you have any questions or concerns, please don't hesitate to send me a message via MyChart or call the office at [phone]. Thank you for visiting with [place] today! It's our pleasure caring for you.

## 2018-09-08 ENCOUNTER — Encounter: Payer: Self-pay | Admitting: Physician Assistant

## 2018-09-08 ENCOUNTER — Ambulatory Visit: Payer: Commercial Managed Care - PPO | Admitting: Physician Assistant

## 2018-09-08 ENCOUNTER — Other Ambulatory Visit: Payer: Self-pay

## 2018-09-08 VITALS — BP 130/82 | HR 82 | Temp 97.9°F | Resp 16 | Ht 68.0 in | Wt 188.0 lb

## 2018-09-08 DIAGNOSIS — M67912 Unspecified disorder of synovium and tendon, left shoulder: Secondary | ICD-10-CM

## 2018-09-08 MED ORDER — METHYLPREDNISOLONE 4 MG PO TBPK: ORAL_TABLET | ORAL | 0 refills | Status: DC

## 2018-09-08 NOTE — Patient Instructions (Signed)
Avoid heavy lifting or overexertion.  Stop the Diclofenac. Take the medrol as directed.  You will be contacted to schedule an appointment with Sports Medicine for further assessment.  I am taking you out of work until follow-up with Dr. Jonni Sanger. She can extend at that time if she chooses to do so.  For the foot, wear supportive footwear with good arch support. Start the stretches below to see if this helps. There steroid pack should also help with this.    Plantar Fasciitis Rehab Ask your health care provider which exercises are safe for you. Do exercises exactly as told by your health care provider and adjust them as directed. It is normal to feel mild stretching, pulling, tightness, or discomfort as you do these exercises, but you should stop right away if you feel sudden pain or your pain gets worse. Do not begin these exercises until told by your health care provider. Stretching and range of motion exercises These exercises warm up your muscles and joints and improve the movement and flexibility of your foot. These exercises also help to relieve pain. Exercise A: Plantar fascia stretch  1. Sit with your left / right leg crossed over your opposite knee. 2. Hold your heel with one hand with that thumb near your arch. With your other hand, hold your toes and gently pull them back toward the top of your foot. You should feel a stretch on the bottom of your toes or your foot or both. 3. Hold this stretch for__________ seconds. 4. Slowly release your toes and return to the starting position. Repeat __________ times. Complete this exercise __________ times a day. Exercise B: Gastroc, standing  1. Stand with your hands against a wall. 2. Extend your left / right leg behind you, and bend your front knee slightly. 3. Keeping your heels on the floor and keeping your back knee straight, shift your weight toward the wall without arching your back. You should feel a gentle stretch in your left / right  calf. 4. Hold this position for __________ seconds. Repeat __________ times. Complete this exercise __________ times a day. Exercise C: Soleus, standing 1. Stand with your hands against a wall. 2. Extend your left / right leg behind you, and bend your front knee slightly. 3. Keeping your heels on the floor, bend your back knee and slightly shift your weight over the back leg. You should feel a gentle stretch deep in your calf. 4. Hold this position for __________ seconds. Repeat __________ times. Complete this exercise __________ times a day. Exercise D: Gastrocsoleus, standing 1. Stand with the ball of your left / right foot on a step. The ball of your foot is on the walking surface, right under your toes. 2. Keep your other foot firmly on the same step. 3. Hold onto the wall or a railing for balance. 4. Slowly lift your other foot, allowing your body weight to press your heel down over the edge of the step. You should feel a stretch in your left / right calf. 5. Hold this position for __________ seconds. 6. Return both feet to the step. 7. Repeat this exercise with a slight bend in your left / right knee. Repeat __________ times with your left / right knee straight and __________ times with your left / right knee bent. Complete this exercise __________ times a day. Balance exercise This exercise builds your balance and strength control of your arch to help take pressure off your plantar fascia. Exercise E: Single leg  stand 1. Without shoes, stand near a railing or in a doorway. You may hold onto the railing or door frame as needed. 2. Stand on your left / right foot. Keep your big toe down on the floor and try to keep your arch lifted. Do not let your foot roll inward. 3. Hold this position for __________ seconds. 4. If this exercise is too easy, you can try it with your eyes closed or while standing on a pillow. Repeat __________ times. Complete this exercise __________ times a day. This  information is not intended to replace advice given to you by your health care provider. Make sure you discuss any questions you have with your health care provider. Document Released: 06/30/2005 Document Revised: 03/04/2016 Document Reviewed: 05/14/2015 Elsevier Interactive Patient Education  2019 [employer].

## 2018-09-08 NOTE — Progress Notes (Signed)
Patient presents to clinic today c/o worsening pain of L shoulder with ROM. Has had recent diagnosis of left rotator cuff tendinitis by his primary care. Was placed on diclofenac BID, encouraged to rest arm and follow-up if not resolving. This is this provider's first time assessing patient. He notes having pain with rotation of arm. Can only lift about half-way before starting to have significant pain. Denies trauma or injury. Denies numbness, tingling or weakness. Denies decreased ROM just pain with ROM.   Past Medical History:  Diagnosis Date  . Abscess of axilla   . Arthritis   . Depression    seasonal affective D/O   . GERD (gastroesophageal reflux disease)   . Hidradenitis suppurativa   . History of chickenpox   . Hyperlipemia   . Neck pain     Current Outpatient Medications on File Prior to Visit  Medication Sig Dispense Refill  . buPROPion (WELLBUTRIN SR) 150 MG 12 hr tablet Take 1 tablet (150 mg total) by mouth 2 (two) times daily. 60 tablet 2  . calcium carbonate (TUMS - DOSED IN MG ELEMENTAL CALCIUM) 500 MG chewable tablet Chew 2 tablets by mouth daily as needed for indigestion or heartburn.    . cetirizine (ZYRTEC) 10 MG tablet Take 1 tablet (10 mg total) by mouth at bedtime. 30 tablet 11  . cholecalciferol (VITAMIN D3) 25 MCG (1000 UT) tablet Take 1,000 Units by mouth daily.    . diclofenac (VOLTAREN) 75 MG EC tablet Take 1 tablet (75 mg total) by mouth 2 (two) times daily. 30 tablet 0  . erythromycin ophthalmic ointment Place 1 application into the left eye 3 (three) times daily. 3.5 g 0  . omeprazole (PRILOSEC) 20 MG capsule Take 1 capsule (20 mg total) by mouth daily. 30 capsule 3  . vitamin E 100 UNIT capsule Take by mouth daily.     No current facility-administered medications on file prior to visit.     No Known Allergies  Family History  Problem Relation Age of Onset  . Hypertension Mother   . Hyperlipidemia Mother   . Breast cancer Mother   . Prostate  cancer Father   . Kidney disease Father   . Colon cancer Father        in his 74's  . Throat cancer Brother        dies age 47 - alcoholic   . Hypertension Brother   . Esophageal cancer Brother   . Healthy Daughter   . Healthy Son   . Healthy Son   . Healthy Son   . Diabetes Brother   . Colon cancer Brother 77  . Colon cancer Brother 70  . Colon cancer Maternal Uncle   . Colon cancer Maternal Uncle   . Colon polyps Neg Hx   . Stomach cancer Neg Hx   . Rectal cancer Neg Hx     Social History   Socioeconomic History  . Marital status: Married    Spouse name: Edsel Petrin  . Number of children: 4  . Years of education: Not on file  . Highest education level: Not on file  Occupational History  . Occupation: Magazine features editor: [employer]  Social Needs  . Financial resource strain: Not on file  . Food insecurity:    Worry: Not on file    Inability: Not on file  . Transportation needs:    Medical: Not on file    Non-medical: Not on file  Tobacco Use  .  Smoking status: Never Smoker  . Smokeless tobacco: Never Used  Substance and Sexual Activity  . Alcohol use: No  . Drug use: No  . Sexual activity: Yes  Lifestyle  . Physical activity:    Days per week: Not on file    Minutes per session: Not on file  . Stress: Not on file  Relationships  . Social connections:    Talks on phone: Not on file    Gets together: Not on file    Attends religious service: Not on file    Active member of club or organization: Not on file    Attends meetings of clubs or organizations: Not on file    Relationship status: Not on file  Other Topics Concern  . Not on file  Social History Narrative   Originally from [place] - See HPI.  All other ROS are negative.  BP 130/82   Pulse 82   Temp 97.9 F (36.6 C) (Oral)   Resp 16   Ht 5\' 8"  (1.727 m)   Wt 188 lb (85.3 kg)   SpO2 98%   BMI 28.59 kg/m   Physical Exam Vitals signs reviewed.  Constitutional:        Appearance: Normal appearance.  HENT:     Head: Normocephalic and atraumatic.     Right Ear: Tympanic membrane normal.     Left Ear: Tympanic membrane normal.     Nose: Nose normal.     Mouth/Throat:     Mouth: Mucous membranes are moist.  Neck:     Musculoskeletal: Neck supple.  Cardiovascular:     Rate and Rhythm: Normal rate and regular rhythm.  Pulmonary:     Effort: Pulmonary effort is normal.     Breath sounds: Normal breath sounds.  Musculoskeletal:     Right shoulder: Normal.     Left shoulder: He exhibits tenderness, swelling (mild swelling of anteromedial shoulder noted on exam. No erythema, induration or warmth) and pain. He exhibits normal range of motion, no bony tenderness and normal strength.     Cervical back: Normal.  Neurological:     Mental Status: He is alert.    Assessment/Plan: 1. Tendinopathy of rotator cuff, left Worsening. Stop diclofenac. Start Medrol pack. No heavy lifting or overexertion. Referral to Sports Medicine placed for MSK [place] and further assessment/management.  - Ambulatory referral to Sports Medicine - methylPREDNISolone (MEDROL) 4 MG TBPK tablet; Take following package directions  Dispense: 21 tablet; Refill: 0   Leeanne Rio, PA-C

## 2018-09-10 ENCOUNTER — Ambulatory Visit: Payer: Commercial Managed Care - PPO | Admitting: Family Medicine

## 2018-09-10 ENCOUNTER — Ambulatory Visit: Payer: Commercial Managed Care - PPO | Admitting: Physician Assistant

## 2018-09-13 ENCOUNTER — Ambulatory Visit: Payer: Commercial Managed Care - PPO | Admitting: Family Medicine

## 2018-09-24 ENCOUNTER — Ambulatory Visit: Payer: Commercial Managed Care - PPO | Admitting: Sports Medicine

## 2018-09-27 ENCOUNTER — Encounter: Payer: Self-pay | Admitting: Sports Medicine

## 2018-12-02 ENCOUNTER — Encounter: Payer: Commercial Managed Care - PPO | Admitting: Family Medicine

## 2018-12-08 ENCOUNTER — Ambulatory Visit (INDEPENDENT_AMBULATORY_CARE_PROVIDER_SITE_OTHER): Payer: Commercial Managed Care - PPO | Admitting: Family Medicine

## 2018-12-08 ENCOUNTER — Encounter: Payer: Self-pay | Admitting: Family Medicine

## 2018-12-08 ENCOUNTER — Other Ambulatory Visit: Payer: Self-pay

## 2018-12-08 VITALS — BP 136/82 | HR 76 | Temp 97.8°F | Resp 16 | Ht 68.0 in | Wt 190.8 lb

## 2018-12-08 DIAGNOSIS — M67912 Unspecified disorder of synovium and tendon, left shoulder: Secondary | ICD-10-CM

## 2018-12-08 DIAGNOSIS — Z0000 Encounter for general adult medical examination without abnormal findings: Secondary | ICD-10-CM

## 2018-12-08 DIAGNOSIS — F411 Generalized anxiety disorder: Secondary | ICD-10-CM | POA: Diagnosis not present

## 2018-12-08 DIAGNOSIS — Z800 Family history of malignant neoplasm of digestive organs: Secondary | ICD-10-CM

## 2018-12-08 DIAGNOSIS — K635 Polyp of colon: Secondary | ICD-10-CM | POA: Insufficient documentation

## 2018-12-08 HISTORY — DX: Family history of malignant neoplasm of digestive organs: Z80.0

## 2018-12-08 HISTORY — DX: Polyp of colon: K63.5

## 2018-12-08 MED ORDER — BUPROPION HCL ER (SR) 150 MG PO TB12: 150.0000 mg | ORAL_TABLET | Freq: Two times a day (BID) | ORAL | 3 refills | Status: DC

## 2018-12-08 NOTE — Progress Notes (Signed)
Subjective  Chief Complaint  Patient presents with  . Annual Exam   HPI: Jacob Foster is a 53 y.o. male who presents to [facility] [place] today for a Male Wellness Visit.   Wellness Visit: annual visit with health maintenance review and exam    Doing well. No new concerns. Has persistent intermittent left shoulder pain - likely RCT due to work. Has used nsaids with mild relief. occ OA pain in hands. Works a Ambulance person job.  Reviewed colonoscopy results from January: serrated and hyperplastic polyps: recheck in 3 years.   GAD: stopped wellbutrin although it had been helpful. Found out his twin brother takes the same and it helps him. Anxiety is mildly active. But managing better than last year.  Lifestyle: Body mass index is 29.01 kg/m. Wt Readings from Last 3 Encounters:  12/08/18 190 lb 12.8 oz (86.5 kg)  09/08/18 188 lb (85.3 kg)  08/20/18 183 lb (83 kg)     Patient Active Problem List   Diagnosis Date Noted  . Family history of colon cancer 12/08/2018  . Serrated polyp of colon 12/08/2018  . Gastroesophageal reflux disease without esophagitis 08/20/2018  . GAD (generalized anxiety disorder) 04/23/2018  . Seasonal affective disorder ([place]) 04/23/2018  . Overweight (BMI 25.0-29.9) 09/05/2017   Health Maintenance  Topic Date Due  . INFLUENZA VACCINE  02/12/2019  . COLONOSCOPY  07/30/2021  . TETANUS/TDAP  10/08/2025  . HIV Screening  Completed   Immunization History  Administered Date(s) Administered  . Influenza,inj,Quad PF,6+ Mos 05/13/2017, 04/23/2018  . Influenza,inj,quad, With Preservative 04/10/2016  . Tdap 10/09/2015   We updated and reviewed the patient's past history in detail and it is documented below. Allergies: Patient has No Known Allergies. Past Medical History  has a past medical history of Abscess of axilla, Arthritis, Depression, Family history of colon cancer (12/08/2018), GERD (gastroesophageal reflux disease), Hidradenitis  suppurativa, History of chickenpox, Hyperlipemia, Neck pain, and Serrated polyp of colon (12/08/2018). Past Surgical History  has a past surgical history that includes Abscess drainage and Elbow surgery. Social History Patient  reports that he has never smoked. He has never used smokeless tobacco. He reports that he does not drink alcohol or use drugs. Family History Patient family history includes Breast cancer in his mother; Colon cancer in his father, maternal uncle, and maternal uncle; Colon cancer (age of onset: 47) in his brother; Colon cancer (age of onset: 82) in his brother; Diabetes in his brother; Esophageal cancer in his brother; Healthy in his daughter, son, son, and son; Hyperlipidemia in his mother; Hypertension in his brother and mother; Kidney disease in his father; Prostate cancer in his father; Throat cancer in his brother. Review of Systems: Constitutional: negative for fever or malaise Ophthalmic: negative for photophobia, double vision or loss of vision Cardiovascular: negative for chest pain, dyspnea on exertion, or new LE swelling Respiratory: negative for SOB or persistent cough Gastrointestinal: negative for abdominal pain, change in bowel habits or melena Genitourinary: negative for dysuria or gross hematuria Musculoskeletal: negative for new gait disturbance or muscular weakness Integumentary: negative for new or persistent rashes, no breast lumps Neurological: negative for TIA or stroke symptoms Psychiatric: negative for SI or delusions Allergic/Immunologic: negative for hives  Patient Care Team    Relationship Specialty Notifications Start End  Leamon Arnt, MD PCP - General Family Medicine  09/04/17    Objective  Vitals: BP 136/82   Pulse 76   Temp 97.8 F (36.6 C) (Oral)  Resp 16   Ht 5\' 8"  (1.727 m)   Wt 190 lb 12.8 oz (86.5 kg)   SpO2 97%   BMI 29.01 kg/m  General:  Well developed, well nourished, no acute distress  Psych:  Alert and  orientedx3,normal mood and affect HEENT:  Normocephalic, atraumatic, non-icteric sclera, PERRL, oropharynx is clear without mass or exudate, supple neck without adenopathy, mass or thyromegaly Cardiovascular:  Normal S1, S2, RRR without gallop, rub or murmur, nondisplaced PMI, +2 distal pulses in bilateral upper and lower extremities. Respiratory:  Good breath sounds bilaterally, CTAB with normal respiratory effort Gastrointestinal: normal bowel sounds, soft, non-tender, no noted masses. No HSM MSK: few mild distal OA changes in hands, Left shoulder with pain full abduction,no ontusions. Joints are without erythema or swelling. Spine and CVA region are nontender Skin:  Warm, no rashes or suspicious lesions noted Neurologic:    Mental status is normal. CN 2-11 are normal. Gross motor and sensory exams are normal. Stable gait. No tremor GU: No inguinal hernias or adenopathy are appreciated bilaterally  Assessment  1. Annual physical exam   2. Family history of colon cancer   3. Serrated polyp of colon   4. GAD (generalized anxiety disorder)   5. Tendinopathy of left rotator cuff      Plan  Male Wellness Visit:  Age appropriate Health Maintenance and Prevention measures were discussed with patient. Included topics are cancer screening recommendations, ways to keep healthy (see AVS) including dietary and exercise recommendations, regular eye and dental care, use of seat belts, and avoidance of moderate alcohol use and tobacco use.   BMI: discussed patient's BMI and encouraged positive lifestyle modifications to help get to or maintain a target BMI.  HM needs and immunizations were addressed and ordered. See below for orders. See HM and immunization section for updates.  Routine labs and screening tests ordered including cmp, cbc and lipids where appropriate.  Discussed recommendations regarding Vit D and calcium supplementation (see AVS)  Restart wellbutrin for GAD. Doing beter.   RCT:  ice, nsaids. F/u if not improving.   CrC screen in 2023.   Follow up: Return in about 6 months (around 06/10/2019) for mood follow up.   Commons side effects, risks, benefits, and alternatives for medications and treatment plan prescribed today were discussed, and the patient expressed understanding of the given instructions. Patient is instructed to call or message via MyChart if he/she has any questions or concerns regarding our treatment plan. No barriers to understanding were identified. We discussed Red Flag symptoms and signs in detail. Patient expressed understanding regarding what to do in case of urgent or emergency type symptoms.   Medication list was reconciled, printed and provided to the patient in AVS. Patient instructions and summary information was reviewed with the patient as documented in the AVS. This note was prepared with assistance of Dragon voice recognition software. Occasional wrong-word or sound-a-like substitutions may have occurred due to the inherent limitations of voice recognition software  Orders Placed This Encounter  Procedures  . CBC with Differential/Platelet  . Comprehensive metabolic panel  . Lipid panel  . HIV Antibody (routine testing w rflx)   Meds ordered this encounter  Medications  . buPROPion (WELLBUTRIN SR) 150 MG 12 hr tablet    Sig: Take 1 tablet (150 mg total) by mouth 2 (two) times daily.    Dispense:  180 tablet    Refill:  3

## 2018-12-08 NOTE — Patient Instructions (Signed)
Please return in 6 months recheck anxiety.  If you have any questions or concerns, please don't hesitate to send me a message via MyChart or call the office at [phone]. Thank you for visiting with [place] today! It's our pleasure caring for you.  Shoulder Range of Motion Exercises Shoulder range of motion (ROM) exercises are done to keep the shoulder moving freely or to increase movement. They are often recommended for people who have shoulder pain or stiffness or who are recovering from a shoulder surgery. Phase 1 exercises When you are able, do this exercise 1-2 times per day for 30-60 seconds in each direction, or as directed by your health care provider. Pendulum exercise To do this exercise while sitting: 1. Sit in a chair or at the edge of your bed with your feet flat on the floor. 2. Let your affected arm hang down in front of you over the edge of the bed or chair. 3. Relax your shoulder, arm, and hand. [place] your body so your arm gently swings in small circles. You can also use your unaffected arm to start the motion. 5. Repeat changing the direction of the circles, swinging your arm left and right, and swinging your arm forward and back. To do this exercise while standing: 1. Stand next to a sturdy chair or table, and hold on to it with your hand on your unaffected side. 2. Bend forward at the waist. 3. Bend your knees slightly. 4. Relax your shoulder, arm, and hand. 5. While keeping your shoulder relaxed, use body motion to swing your arm in small circles. 6. Repeat changing the direction of the circles, swinging your arm left and right, and swinging your arm forward and back. 7. Between exercises, stand up tall and take a short break to relax your lower back.  Phase 2 exercises Do these exercises 1-2 times per day or as told by your health care provider. Hold each stretch for 30 seconds, and repeat 3 times. Do the exercises with one or both arms as instructed by your health care  provider. For these exercises, sit at a table with your hand and arm supported by the table. A chair that slides easily or has wheels can be helpful. External rotation 1. Turn your chair so that your affected side is nearest to the table. 2. Place your forearm on the table to your side. Bend your elbow about 90 at the elbow (right angle) and place your hand palm facing down on the table. Your elbow should be about 6 inches away from your side. 3. Keeping your arm on the table, lean your body forward. Abduction 1. Turn your chair so that your affected side is nearest to the table. 2. Place your forearm and hand on the table so that your thumb points toward the ceiling and your arm is straight out to your side. 3. Slide your hand out to the side and away from you, using your unaffected arm to do the work. 4. To increase the stretch, you can slide your chair away from the table. Flexion: forward stretch 1. Sit facing the table. Place your hand and elbow on the table in front of you. 2. Slide your hand forward and away from you, using your unaffected arm to do the work. 3. To increase the stretch, you can slide your chair backward. Phase 3 exercises Do these exercises 1-2 times per day or as told by your health care provider. Hold each stretch for 30 seconds, and repeat  3 times. Do the exercises with one or both arms as instructed by your health care provider. Cross-body stretch: posterior capsule stretch 1. Lift your arm straight out in front of you. 2. Bend your arm 90 at the elbow (right angle) so your forearm moves across your body. 3. Use your other arm to gently pull the elbow across your body, toward your other shoulder. Wall climbs 1. Stand with your affected arm extended out to the side with your hand resting on a door frame. 2. Slide your hand slowly up the door frame. 3. To increase the stretch, step through the door frame. Keep your body upright and do not lean. Wand exercises You  will need a cane, a piece of PVC pipe, or a sturdy wooden dowel for wand exercises. Flexion To do this exercise while standing: 1. Hold the wand with both of your hands, palms down. 2. Using the other arm to help, lift your arms up and over your head, if able. 3. Push upward with your other arm to gently increase the stretch. To do this exercise while lying down: 1. Lie on your back with your elbows resting on the floor and the wand in both your hands. Your hands will be palm down, or pointing toward your feet. 2. Lift your hands toward the ceiling, using your unaffected arm to help if needed. 3. Bring your arms overhead as able, using your unaffected arm to help if needed. Internal rotation 1. Stand while holding the wand behind you with both hands. Your unaffected arm should be extended above your head with the arm of the affected side extended behind you at the level of your waist. The wand should be pointing straight up and down as you hold it. 2. Slowly pull the wand up behind your back by straightening the elbow of your unaffected arm and bending the elbow of your affected arm. External rotation 1. Lie on your back with your affected upper arm supported on a small pillow or rolled towel. When you first do this exercise, keep your upper arm close to your body. Over time, bring your arm up to a 90 angle out to the side. 2. Hold the wand across your stomach and with both hands palm up. Your elbow on your affected side should be bent at a 90 angle. 3. Use your unaffected side to help push your forearm away from you and toward the floor. Keep your elbow on your affected side bent at a 90 angle. Contact a health care provider if you have:  New or increasing pain.  New numbness, tingling, weakness, or discoloration in your arm or hand. This information is not intended to replace advice given to you by your health care provider. Make sure you discuss any questions you have with your health  care provider. Document Released: 03/29/2003 Document Revised: 08/12/2017 Document Reviewed: 08/12/2017 Elsevier Interactive Patient Education  2019 [employer].  Please do these things to maintain good health!   Exercise at least 30-45 minutes a day,  4-5 days a week.   Eat a low-fat diet with lots of fruits and vegetables, up to 7-9 servings per day.  Drink plenty of water daily. Try to drink 8 8oz glasses per day.  Seatbelts can save your life. Always wear your seatbelt.  Place Smoke Detectors on every level of your home and check batteries every year.  Eye Doctor - have an eye exam every 1-2 years  Safe sex - use condoms to protect yourself  from STDs if you could be exposed to these types of infections.  Avoid heavy alcohol use. If you drink, keep it to less than 2 drinks/day and not every day.  [place].  Choose someone you trust that could speak for you if you became unable to speak for yourself.  Depression is common in our stressful world.If you're feeling down or losing interest in things you normally enjoy, please come in for a visit.

## 2018-12-09 ENCOUNTER — Encounter: Payer: Self-pay | Admitting: *Deleted

## 2018-12-09 LAB — COMPREHENSIVE METABOLIC PANEL
ALT: 27 U/L (ref 0–53)
AST: 22 U/L (ref 0–37)
Albumin: 4.5 g/dL (ref 3.5–5.2)
Alkaline Phosphatase: 69 U/L (ref 39–117)
BUN: 11 mg/dL (ref 6–23)
CO2: 28 mEq/L (ref 19–32)
Calcium: 9.4 mg/dL (ref 8.4–10.5)
Chloride: 102 mEq/L (ref 96–112)
Creatinine, Ser: 1.01 mg/dL (ref 0.40–1.50)
GFR: 94.34 mL/min (ref >60.00)
Glucose, Bld: 86 mg/dL (ref 70–99)
Potassium: 4 mEq/L (ref 3.5–5.1)
Sodium: 139 mEq/L (ref 135–145)
Total Bilirubin: 0.8 mg/dL (ref 0.2–1.2)
Total Protein: 7.6 g/dL (ref 6.0–8.3)

## 2018-12-09 LAB — CBC WITH DIFFERENTIAL/PLATELET
Basophils Absolute: 0.1 10*3/uL (ref 0.0–0.1)
Basophils Relative: 1 % (ref 0.0–3.0)
Eosinophils Absolute: 0.1 10*3/uL (ref 0.0–0.7)
Eosinophils Relative: 2 % (ref 0.0–5.0)
HCT: 50.7 % (ref 39.0–52.0)
Hemoglobin: 17.5 g/dL — ABNORMAL HIGH (ref 13.0–17.0)
Lymphocytes Relative: 44.5 % (ref 12.0–46.0)
Lymphs Abs: 3 10*3/uL (ref 0.7–4.0)
MCHC: 34.6 g/dL (ref 30.0–36.0)
MCV: 87.1 fl (ref 78.0–100.0)
Monocytes Absolute: 0.6 10*3/uL (ref 0.1–1.0)
Monocytes Relative: 9.2 % (ref 3.0–12.0)
Neutro Abs: 2.9 10*3/uL (ref 1.4–7.7)
Neutrophils Relative %: 43.3 % (ref 43.0–77.0)
Platelets: 210 10*3/uL (ref 150.0–400.0)
RBC: 5.82 Mil/uL — ABNORMAL HIGH (ref 4.22–5.81)
RDW: 14.8 % (ref 11.5–15.5)
WBC: 6.7 10*3/uL (ref 4.0–10.5)

## 2018-12-09 LAB — LIPID PANEL
Cholesterol: 231 mg/dL — ABNORMAL HIGH (ref 0–200)
HDL: 36.2 mg/dL — ABNORMAL LOW (ref >39.00)
LDL Cholesterol: 156 mg/dL — ABNORMAL HIGH (ref 0–99)
NonHDL: 195.25
Total CHOL/HDL Ratio: 6
Triglycerides: 197 mg/dL — ABNORMAL HIGH (ref 0.0–149.0)
VLDL: 39.4 mg/dL (ref 0.0–40.0)

## 2018-12-09 LAB — HIV ANTIBODY (ROUTINE TESTING W REFLEX): HIV 1&2 Ab, 4th Generation: NONREACTIVE

## 2018-12-09 NOTE — Progress Notes (Signed)
Please call patient: I have reviewed his/her lab results. Lab test are all stable. No new medications are needed at this time. Continue to eat a low cholesterol diet to keep cholesterol levels in check.   The 10-year ASCVD risk score Mikey Bussing DC Brooke Bonito., et al., 2013) is: 6.8%   Values used to calculate the score:     Age: 53 years     Sex: Male     Is Non-Hispanic African American: Yes     Diabetic: No     Tobacco smoker: No     Systolic Blood Pressure: 628 mmHg     Is BP treated: No     HDL Cholesterol: 36.2 mg/dL     Total Cholesterol: 231 mg/dL

## 2018-12-28 ENCOUNTER — Ambulatory Visit (INDEPENDENT_AMBULATORY_CARE_PROVIDER_SITE_OTHER): Payer: Commercial Managed Care - PPO

## 2018-12-28 ENCOUNTER — Ambulatory Visit: Payer: Commercial Managed Care - PPO | Admitting: Physician Assistant

## 2018-12-28 ENCOUNTER — Telehealth: Payer: Self-pay | Admitting: Family Medicine

## 2018-12-28 ENCOUNTER — Other Ambulatory Visit: Payer: Self-pay

## 2018-12-28 ENCOUNTER — Encounter: Payer: Self-pay | Admitting: Physician Assistant

## 2018-12-28 VITALS — BP 150/90 | HR 91 | Temp 98.7°F | Ht 68.0 in | Wt 191.4 lb

## 2018-12-28 DIAGNOSIS — M25551 Pain in right hip: Secondary | ICD-10-CM

## 2018-12-28 MED ORDER — PREDNISONE 5 MG PO TABS: ORAL_TABLET | ORAL | 0 refills | Status: DC

## 2018-12-28 NOTE — Telephone Encounter (Signed)
Please schedule for tomorrow or thursday

## 2018-12-28 NOTE — Telephone Encounter (Signed)
Please advise 

## 2018-12-28 NOTE — Patient Instructions (Addendum)
Please perform the exercise program that we have prepared for you and gone over in detail on a daily basis.  In addition to the handout you were provided you can access your program through: [email or web address]   Your unique program code is:  [email or web address]  We will be in touch as soon as your labs and xrays return.  Try the exercises as tolerated.  Please see Charlann Boxer, sports medicine provider to help [place] figure out what is going on. We will call tomorrow to schedule this.  Take the oral prednisone as directed.

## 2018-12-28 NOTE — Telephone Encounter (Signed)
Pts wife called in reference to pt having RT hip pain. I informed pt that Jonni Sanger was full today and could see someone else. PT stated he was told at [facility] that he could be worked in if needed. Pt did not want to see anyone else. Please advise.

## 2018-12-28 NOTE — Progress Notes (Signed)
Jacob Foster is a 53 y.o. male here for a new problem.  I acted as a Education administrator for [employer], PA-C Anselmo Pickler, LPN  History of Present Illness:   Chief Complaint  Patient presents with  . Right hip pain  . joint pan    HPI   Hip pain Pt c/o right hip pain x 3-4 months, worse in the past month. Has pain in hip when raises leg. Denies radiation. Using Advil & Tylenol no relief.  Joint pain Pt c/o joint pain x 1 year, hips, knees, elbows, writs and fingers and neck. He works for [employer] and is constantly on his feet -- has had this type of work for >25 years. Denies family hx of autoimmune disorder. Has seen PT in the past for his elbow.  Past Medical History:  Diagnosis Date  . Abscess of axilla   . Arthritis   . Depression    seasonal affective D/O   . Family history of colon cancer 12/08/2018   Father and brother. Sees Dr. Enis Gash  . GERD (gastroesophageal reflux disease)   . Hidradenitis suppurativa   . History of chickenpox   . Hyperlipemia   . Neck pain   . Serrated polyp of colon 12/08/2018     Social History   Socioeconomic History  . Marital status: Married    Spouse name: Edsel Petrin  . Number of children: 4  . Years of education: Not on file  . Highest education level: Not on file  Occupational History  . Occupation: Magazine features editor: [employer]  Social Needs  . Financial resource strain: Not on file  . Food insecurity    Worry: Not on file    Inability: Not on file  . Transportation needs    Medical: Not on file    Non-medical: Not on file  Tobacco Use  . Smoking status: Never Smoker  . Smokeless tobacco: Never Used  Substance and Sexual Activity  . Alcohol use: No  . Drug use: No  . Sexual activity: Yes  Lifestyle  . Physical activity    Days per week: Not on file    Minutes per session: Not on file  . Stress: Not on file  Relationships  . Social Herbalist on phone: Not on file    Gets together: Not on file     Attends religious service: Not on file    Active member of club or organization: Not on file    Attends meetings of clubs or organizations: Not on file    Relationship status: Not on file  . Intimate partner violence    Fear of current or ex partner: Not on file    Emotionally abused: Not on file    Physically abused: Not on file    Forced sexual activity: Not on file  Other Topics Concern  . Not on file  Social History Narrative   Originally from [facility]    Past Surgical History:  Procedure Laterality Date  . ABSCESS DRAINAGE    . ELBOW SURGERY      Family History  Problem Relation Age of Onset  . Hypertension Mother   . Hyperlipidemia Mother   . Breast cancer Mother   . Prostate cancer Father   . Kidney disease Father   . Colon cancer Father        in his 41's  . Throat cancer Brother        dies age 52 -  alcoholic   . Hypertension Brother   . Esophageal cancer Brother   . Healthy Daughter   . Healthy Son   . Healthy Son   . Healthy Son   . Diabetes Brother   . Colon cancer Brother 30  . Colon cancer Brother 84  . Colon cancer Maternal Uncle   . Colon cancer Maternal Uncle   . Colon polyps Neg Hx   . Stomach cancer Neg Hx   . Rectal cancer Neg Hx     No Known Allergies  Current Medications:   Current Outpatient Medications:  .  acetaminophen (TYLENOL) 500 MG tablet, Take 1,500 mg by mouth as needed., Disp: , Rfl:  .  buPROPion (WELLBUTRIN SR) 150 MG 12 hr tablet, Take 1 tablet (150 mg total) by mouth 2 (two) times daily., Disp: 180 tablet, Rfl: 3 .  calcium carbonate (TUMS - DOSED IN MG ELEMENTAL CALCIUM) 500 MG chewable tablet, Chew 2 tablets by mouth daily as needed for indigestion or heartburn., Disp: , Rfl:  .  ibuprofen (ADVIL) 200 MG tablet, Take 600 mg by mouth as needed., Disp: , Rfl:  .  predniSONE (DELTASONE) 5 MG tablet, 6-5-4-3-2-1-off, Disp: 21 tablet, Rfl: 0   Review of Systems:   Review of Systems  Constitutional: Negative for chills,  fever, malaise/fatigue and weight loss.  Respiratory: Negative for shortness of breath.   Cardiovascular: Negative for chest pain, orthopnea, claudication and leg swelling.  Gastrointestinal: Negative for heartburn, nausea and vomiting.  Musculoskeletal: Positive for back pain and joint pain. Negative for myalgias.  Neurological: Negative for dizziness, tingling and headaches.    Vitals:   Vitals:   12/28/18 1513  BP: (!) 150/90  Pulse: 91  Temp: 98.7 F (37.1 C)  TempSrc: Oral  SpO2: 96%  Weight: 191 lb 6.1 oz (86.8 kg)  Height: 5\' 8"  (1.727 m)     Body mass index is 29.1 kg/m.  Physical Exam:   Physical Exam Vitals signs and nursing note reviewed.  Constitutional:      Appearance: He is well-developed.  HENT:     Head: Normocephalic.  Eyes:     Conjunctiva/sclera: Conjunctivae normal.     Pupils: Pupils are equal, round, and reactive to light.  Neck:     Musculoskeletal: Normal range of motion.  Pulmonary:     Effort: Pulmonary effort is normal.  Musculoskeletal: Normal range of motion.     Comments: Decreased ROM of flexion of R leg due to pain in R hip. Pain in hip with resisted flexion of R leg. No abnormal gait. No palpable tenderness of hip.  Skin:    General: Skin is warm and dry.  Neurological:     Mental Status: He is alert and oriented to person, place, and time.  Psychiatric:        Behavior: Behavior normal.        Thought Content: Thought content normal.        Judgment: Judgment normal.        Assessment and Plan:   Seeley was seen today for right hip pain and joint pan.  Diagnoses and all orders for this visit:  Right hip pain X ray pending. Will trial prednisone -- has taken this in the past without issues. Was given a few exercises from our athletic trainer, [place]. Appointment with Charlann Boxer, sports medicine. Will also check labs, per patient request to assess for autoimmune issues.  -     DG HIP UNILAT W OR W/O PELVIS  2-3 VIEWS RIGHT -      CBC with Differential/Platelet -     Sedimentation rate -     C-reactive protein -     ANA -     Rheumatoid factor -     CYCLIC CITRUL PEPTIDE ANTIBODY, IGG/IGA -     Ambulatory referral to Sports Medicine -     Cyclic citrul peptide antibody, IgG  Other orders -     predniSONE (DELTASONE) 5 MG tablet; 6-5-4-3-2-1-off    . Reviewed expectations re: course of current medical issues. . Discussed self-management of symptoms. . Outlined signs and symptoms indicating need for more acute intervention. . Patient verbalized understanding and all questions were answered. . See orders for this visit as documented in the electronic medical record. . Patient received an After-Visit Summary.  CMA or LPN served as scribe during this visit. History, Physical, and Plan performed by medical provider. The above documentation has been reviewed and is accurate and complete.   Inda Coke, PA-C

## 2018-12-28 NOTE — Telephone Encounter (Signed)
FYI this pt has been added to your 3pm spot

## 2018-12-29 ENCOUNTER — Ambulatory Visit (INDEPENDENT_AMBULATORY_CARE_PROVIDER_SITE_OTHER): Payer: Commercial Managed Care - PPO

## 2018-12-29 ENCOUNTER — Encounter: Payer: Self-pay | Admitting: Family Medicine

## 2018-12-29 ENCOUNTER — Other Ambulatory Visit: Payer: Self-pay

## 2018-12-29 ENCOUNTER — Ambulatory Visit: Payer: Commercial Managed Care - PPO | Admitting: Family Medicine

## 2018-12-29 ENCOUNTER — Encounter: Payer: Self-pay | Admitting: Physician Assistant

## 2018-12-29 VITALS — BP 134/92 | HR 77 | Temp 97.7°F | Ht 68.0 in | Wt 191.0 lb

## 2018-12-29 DIAGNOSIS — F411 Generalized anxiety disorder: Secondary | ICD-10-CM | POA: Diagnosis not present

## 2018-12-29 DIAGNOSIS — M67912 Unspecified disorder of synovium and tendon, left shoulder: Secondary | ICD-10-CM | POA: Diagnosis not present

## 2018-12-29 DIAGNOSIS — M25551 Pain in right hip: Secondary | ICD-10-CM | POA: Diagnosis not present

## 2018-12-29 LAB — CBC WITH DIFFERENTIAL/PLATELET
Basophils Absolute: 0.1 10*3/uL (ref 0.0–0.1)
Basophils Relative: 1 % (ref 0.0–3.0)
Eosinophils Absolute: 0.1 10*3/uL (ref 0.0–0.7)
Eosinophils Relative: 1.5 % (ref 0.0–5.0)
HCT: 53.5 % — ABNORMAL HIGH (ref 39.0–52.0)
Hemoglobin: 17.8 g/dL — ABNORMAL HIGH (ref 13.0–17.0)
Lymphocytes Relative: 33.3 % (ref 12.0–46.0)
Lymphs Abs: 2.4 10*3/uL (ref 0.7–4.0)
MCHC: 33.2 g/dL (ref 30.0–36.0)
MCV: 88.6 fl (ref 78.0–100.0)
Monocytes Absolute: 0.5 10*3/uL (ref 0.1–1.0)
Monocytes Relative: 6.6 % (ref 3.0–12.0)
Neutro Abs: 4.1 10*3/uL (ref 1.4–7.7)
Neutrophils Relative %: 57.6 % (ref 43.0–77.0)
Platelets: 205 10*3/uL (ref 150.0–400.0)
RBC: 6.04 Mil/uL — ABNORMAL HIGH (ref 4.22–5.81)
RDW: 14.5 % (ref 11.5–15.5)
WBC: 7.1 10*3/uL (ref 4.0–10.5)

## 2018-12-29 LAB — C-REACTIVE PROTEIN: CRP: <1 mg/dL (ref 0.5–20.0)

## 2018-12-29 LAB — SEDIMENTATION RATE: Sed Rate: 6 mm/hr (ref 0–20)

## 2018-12-29 LAB — CYCLIC CITRUL PEPTIDE ANTIBODY, IGG: Cyclic Citrullin Peptide Ab: <16 UNITS

## 2018-12-29 MED ORDER — TRAMADOL HCL 50 MG PO TABS: 50.0000 mg | ORAL_TABLET | Freq: Four times a day (QID) | ORAL | 0 refills | Status: DC | PRN

## 2018-12-29 NOTE — Patient Instructions (Signed)
Please see Sports Medicine specialist once given the appointment.   Use the ultram as needed for pain; hopefully the prednisone will help.   If you have any questions or concerns, please don't hesitate to send me a message via MyChart or call the office at [phone]. Thank you for visiting with [place] today! It's our pleasure caring for you.   Rotator Cuff Tendinitis  Rotator cuff tendinitis is inflammation of the tough, cord-like bands that connect muscle to bone (tendons) in the rotator cuff. The rotator cuff includes all of the muscles and tendons that connect the arm to the shoulder. The rotator cuff holds the head of the upper arm bone (humerus) in the cup (fossa) of the shoulder blade (scapula). This condition can lead to a long-lasting (chronic) tear. The tear may be partial or complete. What are the causes? This condition is usually caused by overusing the rotator cuff. What increases the risk? This condition is more likely to develop in athletes and workers who frequently use their shoulder or reach over their heads. This can include activities such as:  Tennis.  Baseball or softball.  Swimming.  Construction work.  Painting. What are the signs or symptoms? Symptoms of this condition include:  Pain spreading (radiating) from the shoulder to the upper arm.  Swelling and tenderness in front of the shoulder.  Pain when reaching, pulling, or lifting the arm above the head.  Pain when lowering the arm from above the head.  Minor pain in the shoulder when resting.  Increased pain in the shoulder at night.  Difficulty placing the arm behind the back. How is this diagnosed? This condition is diagnosed with a medical history and physical exam. Tests may also be done, including:  X-rays.  MRI.  Ultrasounds.  CT or MR arthrogram. During this test, a contrast material is injected and then images are taken. How is this treated? Treatment for this condition depends on  the severity of the condition. In less severe cases, treatment may include:  Rest. This may be done with a sling that holds the shoulder still (immobilization). Your health care provider may also recommend avoiding activities that involve lifting your arm over your head.  Icing the shoulder.  Anti-inflammatory medicines, such as aspirin or ibuprofen. In more severe cases, treatment may include:  Physical therapy.  Steroid injections.  Surgery. Follow these instructions at home: If you have a sling:  Wear the sling as told by your health care provider. Remove it only as told by your health care provider.  Loosen the sling if your fingers tingle, become numb, or turn cold and blue.  Keep the sling clean.  If the sling is not waterproof, do not let it get wet. Remove it, if allowed, or cover it with a watertight covering when you take a bath or shower. Managing pain, stiffness, and swelling  If directed, put ice on the injured area. ? If you have a removable sling, remove it as told by your health care provider. ? Put ice in a plastic bag. ? Place a towel between your skin and the bag. ? Leave the ice on for 20 minutes, 2-3 times a day.  Move your fingers often to avoid stiffness and to lessen swelling.  Raise (elevate) the injured area above the level of your heart while you are lying down.  Find a comfortable sleeping position or sleep on a recliner, if available. Driving  Do not drive or use heavy machinery while taking prescription pain medicine.  Ask your health care provider when it is safe to drive if you have a sling on your arm. Activity  Rest your shoulder as told by your health care provider.  Return to your normal activities as told by your health care provider. Ask your health care provider what activities are safe for you.  Do any exercises or stretches as told by your health care provider.  If you do repetitive overhead tasks, take small breaks in between  and include stretching exercises as told by your health care provider. General instructions  Do not use any products that contain nicotine or tobacco, such as cigarettes and e-cigarettes. These can delay healing. If you need help quitting, ask your health care provider.  Take over-the-counter and prescription medicines only as told by your health care provider.  Keep all follow-up visits as told by your health care provider. This is important. Contact a health care provider if:  Your pain gets worse.  You have new pain in your arm, hands, or fingers.  Your pain is not relieved with medicine or does not get better after 6 weeks of treatment.  You have cracking sensations when moving your shoulder in certain directions.  You hear a snapping sound after using your shoulder, followed by severe pain and weakness. Get help right away if:  Your arm, hand, or fingers are numb or tingling.  Your arm, hand, or fingers are swollen or painful or they turn white or blue. Summary  Rotator cuff tendinitis is inflammation of the tough, cord-like bands that connect muscle to bone (tendons) in the rotator cuff.  This condition is usually caused by overusing the rotator cuff, which includes all of the muscles and tendons that connect the arm to the shoulder.  This condition is more likely to develop in athletes and workers who frequently use their shoulder or reach over their heads.  Treatment generally includes rest, anti-inflammatory medicines, and icing. In some cases, physical therapy and steroid injections may be needed. In severe cases, surgery may be needed. This information is not intended to replace advice given to you by your health care provider. Make sure you discuss any questions you have with your health care provider. Document Released: 09/20/2003 Document Revised: 06/16/2016 Document Reviewed: 06/16/2016 Elsevier Interactive Patient Education  2019 [employer].

## 2018-12-29 NOTE — Progress Notes (Signed)
Subjective  CC:  Chief Complaint  Patient presents with  . persistent R hip pain    seen on 6/16 by Inda Coke  . persistent L shoulder pain    HPI: Jacob Foster is a 53 y.o. male who presents to the office today to address the problems listed above in the chief complaint.  Pt with history of neck pain , (nl cervical spine xrays), right hip pain  - see yesterdays note, and L shoulder pain presents to discuss pain again.   Yesterday xrays of hips show mild djd. Lab eval pending. And referred to [facility] and was given exercises as well as prednisone.   Today: Complains of left hip pain and wants to be taken out of work.  Also recurrent left shoulder pain.  This was evaluated last year and patient was referred to sports medicine but never went.  Continues to have pain with lifting above his neck.  No weakness.  No radicular symptoms.  This has not been x-rayed. Assessment  1. Right hip pain   2. Tendinopathy of left rotator cuff   3. GAD (generalized anxiety disorder)      Plan   Right hip pain: Abnormal exam with mild DJD and x-rays: Exercise given.  Prednisone trial, add Ultram to help with pain control and urged to get to sports medicine for further evaluation and treatment options  Persistent presumed left rotator cuff tendinopathy: X-ray today and refer to sports medicine.  Prednisone may be helpful.  General anxiety disorder: Continues to worry about his overall wellbeing.  Continue medications.  Follow up: No follow-ups on file.  06/17/2019  No orders of the defined types were placed in this encounter.  No orders of the defined types were placed in this encounter.     I reviewed the patients updated PMH, FH, and SocHx.    Patient Active Problem List   Diagnosis Date Noted  . Family history of colon cancer 12/08/2018  . Serrated polyp of colon 12/08/2018  . Gastroesophageal reflux disease without esophagitis 08/20/2018  . GAD (generalized anxiety disorder) 04/23/2018   . Seasonal affective disorder ([place]) 04/23/2018  . Overweight (BMI 25.0-29.9) 09/05/2017   Current Meds  Medication Sig  . acetaminophen (TYLENOL) 500 MG tablet Take 1,500 mg by mouth as needed.  Marland Kitchen buPROPion (WELLBUTRIN SR) 150 MG 12 hr tablet Take 1 tablet (150 mg total) by mouth 2 (two) times daily.  Marland Kitchen ibuprofen (ADVIL) 200 MG tablet Take 600 mg by mouth as needed.  . predniSONE (DELTASONE) 5 MG tablet 6-5-4-3-2-1-off    Allergies: Patient has No Known Allergies. Family History: Patient family history includes Breast cancer in his mother; Colon cancer in his father, maternal uncle, and maternal uncle; Colon cancer (age of onset: 38) in his brother; Colon cancer (age of onset: 77) in his brother; Diabetes in his brother; Esophageal cancer in his brother; Healthy in his daughter, son, son, and son; Hyperlipidemia in his mother; Hypertension in his brother and mother; Kidney disease in his father; Prostate cancer in his father; Throat cancer in his brother. Social History:  Patient  reports that he has never smoked. He has never used smokeless tobacco. He reports that he does not drink alcohol or use drugs.  Review of Systems: Constitutional: Negative for fever malaise or anorexia Cardiovascular: negative for chest pain Respiratory: negative for SOB or persistent cough Gastrointestinal: negative for abdominal pain  Objective  Vitals: There were no vitals taken for this visit. General: no acute distress ,  A&Ox3 Left shoulder: Pain with abduction greater than 90 degrees,  Right hip: Decreased external/internal rotation due to pain.  Normal flexion Hip x-ray reviewed: Mild DJD bilateral hips Skin:  Warm, no rashes Normal gait     Commons side effects, risks, benefits, and alternatives for medications and treatment plan prescribed today were discussed, and the patient expressed understanding of the given instructions. Patient is instructed to call or message via MyChart if he/she has  any questions or concerns regarding our treatment plan. No barriers to understanding were identified. We discussed Red Flag symptoms and signs in detail. Patient expressed understanding regarding what to do in case of urgent or emergency type symptoms.   Medication list was reconciled, printed and provided to the patient in AVS. Patient instructions and summary information was reviewed with the patient as documented in the AVS. This note was prepared with assistance of Dragon voice recognition software. Occasional wrong-word or sound-a-like substitutions may have occurred due to the inherent limitations of voice recognition software

## 2018-12-30 LAB — RHEUMATOID FACTOR: Rheumatoid fact SerPl-aCnc: <14 IU/mL (ref <14)

## 2018-12-30 LAB — ANA: Anti Nuclear Antibody (ANA): POSITIVE — AB

## 2018-12-30 LAB — ANTI-NUCLEAR AB-TITER (ANA TITER): ANA Titer 1: 1:640 {titer} — ABNORMAL HIGH

## 2018-12-30 NOTE — Progress Notes (Signed)
Please call patient: I have reviewed his/her lab results. Shoulder xray shows mild arthritis as well. Please take medications as prescribed and see the sports medicine doctor for further care.

## 2018-12-31 ENCOUNTER — Other Ambulatory Visit: Payer: Self-pay | Admitting: *Deleted

## 2018-12-31 DIAGNOSIS — R768 Other specified abnormal immunological findings in serum: Secondary | ICD-10-CM

## 2018-12-31 DIAGNOSIS — M2550 Pain in unspecified joint: Secondary | ICD-10-CM

## 2018-12-31 NOTE — Progress Notes (Signed)
amb  

## 2019-01-07 ENCOUNTER — Telehealth: Payer: Self-pay

## 2019-01-07 NOTE — Telephone Encounter (Signed)
Copied from [place] [id number]. Topic: General - Other >> Jan 06, 2019  4:24 PM Leward Quan A wrote: Reason for CRM: [facility] called to request a copy of patients insurance card faxed over to them. Att. Omega fax# [phone]

## 2019-01-19 NOTE — Progress Notes (Deleted)
[facility] [address], [place] [address] Phone: [phone] Subjective:    I'm seeing this patient by the request  of:  Leamon Arnt, MD   QD:IYMEB hip pain  RAX:[id number]  Raj Landress is a 53 y.o. male coming in with complaint of ***  Onset-  Location Duration-  Character- Aggravating factors- Reliving factors-  Therapies tried-  Severity-     Past Medical History:  Diagnosis Date   Abscess of axilla    Arthritis    Depression    seasonal affective D/O    Family history of colon cancer 12/08/2018   Father and brother. Sees Dr. Enis Gash   GERD (gastroesophageal reflux disease)    Hidradenitis suppurativa    History of chickenpox    Hyperlipemia    Neck pain    Serrated polyp of colon 12/08/2018   Past Surgical History:  Procedure Laterality Date   ABSCESS DRAINAGE     ELBOW SURGERY     Social History   Socioeconomic History   Marital status: Married    Spouse name: Secretary/administrator   Number of children: 4   Years of education: Not on file   Highest education level: Not on file  Occupational History   Occupation: Magazine features editor: [place] resource strain: Not on file   Food insecurity    Worry: Not on file    Inability: Not on file   Transportation needs    Medical: Not on file    Non-medical: Not on file  Tobacco Use   Smoking status: Never Smoker   Smokeless tobacco: Never Used  Substance and Sexual Activity   Alcohol use: No   Drug use: No   Sexual activity: Yes  Lifestyle   Physical activity    Days per week: Not on file    Minutes per session: Not on file   Stress: Not on file  Relationships   Social connections    Talks on phone: Not on file    Gets together: Not on file    Attends religious service: Not on file    Active member of club or organization: Not on file    Attends meetings of clubs or organizations: Not on file   Relationship status: Not on file  Other Topics Concern   Not on file  Social History Narrative   Originally from [facility]   No Known Allergies Family History  Problem Relation Age of Onset   Hypertension Mother    Hyperlipidemia Mother    Breast cancer Mother    Prostate cancer Father    Kidney disease Father    Colon cancer Father        in his 22's   Throat cancer Brother        dies age 41 - alcoholic    Hypertension Brother    Esophageal cancer Brother    Healthy Daughter    Healthy Son    Healthy Son    Healthy Son    Diabetes Brother    Colon cancer Brother 12   Colon cancer Brother 57   Colon cancer Maternal Uncle    Colon cancer Maternal Uncle    Colon polyps Neg Hx    Stomach cancer Neg Hx    Rectal cancer Neg Hx     Current Outpatient Medications (Endocrine & Metabolic):    predniSONE (DELTASONE) 5 MG tablet, 6-5-4-3-2-1-off    Current  Outpatient Medications (Analgesics):    acetaminophen (TYLENOL) 500 MG tablet, Take 1,500 mg by mouth as needed.   ibuprofen (ADVIL) 200 MG tablet, Take 600 mg by mouth as needed.   traMADol (ULTRAM) 50 MG tablet, Take 1 tablet (50 mg total) by mouth every 6 (six) hours as needed for moderate pain.   Current Outpatient Medications (Other):    buPROPion (WELLBUTRIN SR) 150 MG 12 hr tablet, Take 1 tablet (150 mg total) by mouth 2 (two) times daily.   calcium carbonate (TUMS - DOSED IN MG ELEMENTAL CALCIUM) 500 MG chewable tablet, Chew 2 tablets by mouth daily as needed for indigestion or heartburn.    Past medical history, social, surgical and family history all reviewed in electronic medical record.  No pertanent information unless stated regarding to the chief complaint.   Review of Systems:  No headache, visual changes, nausea, vomiting, diarrhea, constipation, dizziness, abdominal pain, skin rash, fevers, chills, night sweats, weight loss, swollen lymph nodes, body aches, joint swelling, muscle  aches, chest pain, shortness of breath, mood changes.   Objective  There were no vitals taken for this visit. Systems examined below as of    General: No apparent distress alert and oriented x3 mood and affect normal, dressed appropriately.  HEENT: Pupils equal, extraocular movements intact  Respiratory: Patient's speak in full sentences and does not appear short of breath  Cardiovascular: No lower extremity edema, non tender, no erythema  Skin: Warm dry intact with no signs of infection or rash on extremities or on axial skeleton.  Abdomen: Soft nontender  Neuro: Cranial nerves II through XII are intact, neurovascularly intact in all extremities with 2+ DTRs and 2+ pulses.  Lymph: No lymphadenopathy of posterior or anterior cervical chain or axillae bilaterally.  Gait normal with good balance and coordination.  MSK:  Non tender with full range of motion and good stability and symmetric strength and tone of shoulders, elbows, wrist,  knee and ankles bilaterally.  Hip: ROM IR: 45 Deg, ER: 45 Deg, Flexion: 120 Deg, Extension: 100 Deg, Abduction: 45 Deg, Adduction: 45 Deg Strength IR: 5/5, ER: 5/5, Flexion: 5/5, Extension: 5/5, Abduction: 5/5, Adduction: 5/5 Pelvic alignment unremarkable to inspection and palpation. Standing hip rotation and gait without trendelenburg sign / unsteadiness. Greater trochanter without tenderness to palpation. No tenderness over piriformis and greater trochanter. No pain with FABER or FADIR. No SI joint tenderness and normal minimal SI movement.   Impression and Recommendations:     This case required medical decision making of moderate complexity. The above documentation has been reviewed and is accurate and complete Lyndal Pulley, DO       Note: This dictation was prepared with Dragon dictation along with smaller phrase technology. Any transcriptional errors that result from this process are unintentional.

## 2019-01-20 ENCOUNTER — Ambulatory Visit: Payer: Commercial Managed Care - PPO | Admitting: Family Medicine

## 2019-01-20 DIAGNOSIS — Z0289 Encounter for other administrative examinations: Secondary | ICD-10-CM

## 2019-03-09 ENCOUNTER — Telehealth: Payer: Self-pay | Admitting: *Deleted

## 2019-03-09 ENCOUNTER — Other Ambulatory Visit: Payer: Self-pay | Admitting: *Deleted

## 2019-03-09 DIAGNOSIS — J09X2 Influenza due to identified novel influenza A virus with other respiratory manifestations: Secondary | ICD-10-CM

## 2019-03-09 NOTE — Telephone Encounter (Signed)
Ok with me 

## 2019-03-09 NOTE — Telephone Encounter (Signed)
Ok with me. thanks 

## 2019-03-09 NOTE — Telephone Encounter (Signed)
Patient is asking to transfer back to the [place] office. Pt is asking to transfer from Dr. Jonni Sanger to Elyn Aquas.

## 2019-03-10 LAB — NOVEL CORONAVIRUS, NAA: SARS-CoV-2, NAA: DETECTED — AB

## 2019-03-10 NOTE — Telephone Encounter (Signed)
Virtual TOC appt has been scheduled

## 2019-03-14 ENCOUNTER — Other Ambulatory Visit: Payer: Self-pay

## 2019-03-14 ENCOUNTER — Encounter: Payer: Self-pay | Admitting: Physician Assistant

## 2019-03-14 ENCOUNTER — Ambulatory Visit (INDEPENDENT_AMBULATORY_CARE_PROVIDER_SITE_OTHER): Payer: Commercial Managed Care - PPO | Admitting: Physician Assistant

## 2019-03-14 DIAGNOSIS — U071 COVID-19: Secondary | ICD-10-CM

## 2019-03-14 MED ORDER — HYDROCOD POLST-CPM POLST ER 10-8 MG/5ML PO SUER: 5.0000 mL | Freq: Two times a day (BID) | ORAL | 0 refills | Status: DC | PRN

## 2019-03-14 NOTE — Patient Instructions (Signed)
Instructions sent to MyChart.  Please keep hydrated and get plenty of rest. Tylenol if needed for fever, aches. Continue Mucinex. I have sent in a prescription cough medication for you to take as directed. I have enrolled you in a symptom monitoring program. You will get messages daily to fill out a questionnaire to update [place] on your symptoms.  If you note acute worsening of symptoms, especially any worsening Shortness of breath, or if you develop chest pain, please be seen at nearest ER.

## 2019-03-14 NOTE — Progress Notes (Signed)
   Virtual Visit via Video   I connected with patient on 03/14/19 at  2:00 PM EDT by a video enabled telemedicine application and verified that I am speaking with the correct person using two identifiers.  Location patient: Home Location provider: [facility], Office Persons participating in the virtual visit: Patient, Provider, [place] (Jacob Foster)  I discussed the limitations of evaluation and management by telemedicine and the availability of in person appointments. The patient expressed understanding and agreed to proceed.  Subjective:   HPI:   Patient presents via [facility] today as a TOC from Dr. Jonni Sanger at our [facility] office. Patient with acute concern today. Patient notes symptoms of fatigue, aches, chills, lightheadedness, rhinorrhea and heavy cough starting 03/08/2019. Was tested for COVID on 03/09/2019 and this was +. Patient endorses Tmax up to 101. Denies pleuritic chest pain. Notes some chest wall tenderness with coughing. Sporadic chest tightness. Cough is significant, worse at night and preventing him from sleeping. Cough productive mainly in the morning. Denies GI symptoms. Has not been able to eat much due to some anorexia. Is hydrating well. Has not taken anything for symptoms.   ROS:   See pertinent positives and negatives per HPI.  Patient Active Problem List   Diagnosis Date Noted  . Family history of colon cancer 12/08/2018  . Serrated polyp of colon 12/08/2018  . Gastroesophageal reflux disease without esophagitis 08/20/2018  . GAD (generalized anxiety disorder) 04/23/2018  . Seasonal affective disorder ([place]) 04/23/2018  . Overweight (BMI 25.0-29.9) 09/05/2017    Social History   Tobacco Use  . Smoking status: Never Smoker  . Smokeless tobacco: Never Used  Substance Use Topics  . Alcohol use: No   No current outpatient medications on file.  No Known Allergies  Objective:   There were no vitals taken for this visit.  Patient is well-developed,  well-nourished in no acute distress.  Resting comfortably at home.  Head is normocephalic, atraumatic.  No labored breathing.  Speech is clear and coherent with logical content.  Patient is alert and oriented at baseline.   Assessment and Plan:   1. COVID-19 virus infection Non-labored breathing. Able to speak in complete sentences. Will have him continue to hydrate and rest. Start Mucinex BID. RX Tussionex for cough to allow rest. Tylenol if needed for fever or aches. Strict ER precautions reviewed with patient. Patient enrolled in [place] monitoring program. Handout sent to MyChart.    Leeanne Rio, PA-C 03/14/2019

## 2019-03-14 NOTE — Progress Notes (Signed)
I have discussed the procedure for the virtual visit with the patient who has given consent to proceed with assessment and treatment.   Matteus Mcnelly S Diamantina Edinger, CMA     

## 2019-03-15 ENCOUNTER — Encounter (HOSPITAL_COMMUNITY): Payer: Self-pay

## 2019-03-15 ENCOUNTER — Inpatient Hospital Stay (HOSPITAL_COMMUNITY)
Admission: EM | Admit: 2019-03-15 | Discharge: 2019-03-19 | DRG: 177 | Disposition: A | Discharge status: Home / Self Care | Payer: Commercial Managed Care - PPO | Attending: Internal Medicine | Admitting: Internal Medicine

## 2019-03-15 ENCOUNTER — Emergency Department (HOSPITAL_COMMUNITY): Payer: Commercial Managed Care - PPO

## 2019-03-15 ENCOUNTER — Other Ambulatory Visit: Payer: Self-pay

## 2019-03-15 ENCOUNTER — Telehealth: Payer: Self-pay | Admitting: *Deleted

## 2019-03-15 DIAGNOSIS — E785 Hyperlipidemia, unspecified: Secondary | ICD-10-CM | POA: Diagnosis present

## 2019-03-15 DIAGNOSIS — L732 Hidradenitis suppurativa: Secondary | ICD-10-CM | POA: Diagnosis present

## 2019-03-15 DIAGNOSIS — Z8042 Family history of malignant neoplasm of prostate: Secondary | ICD-10-CM

## 2019-03-15 DIAGNOSIS — Z808 Family history of malignant neoplasm of other organs or systems: Secondary | ICD-10-CM

## 2019-03-15 DIAGNOSIS — Z833 Family history of diabetes mellitus: Secondary | ICD-10-CM | POA: Diagnosis not present

## 2019-03-15 DIAGNOSIS — Z8349 Family history of other endocrine, nutritional and metabolic diseases: Secondary | ICD-10-CM | POA: Diagnosis not present

## 2019-03-15 DIAGNOSIS — U071 COVID-19: Secondary | ICD-10-CM | POA: Diagnosis present

## 2019-03-15 DIAGNOSIS — Z841 Family history of disorders of kidney and ureter: Secondary | ICD-10-CM

## 2019-03-15 DIAGNOSIS — K219 Gastro-esophageal reflux disease without esophagitis: Secondary | ICD-10-CM | POA: Diagnosis present

## 2019-03-15 DIAGNOSIS — Z8619 Personal history of other infectious and parasitic diseases: Secondary | ICD-10-CM

## 2019-03-15 DIAGNOSIS — Z803 Family history of malignant neoplasm of breast: Secondary | ICD-10-CM | POA: Diagnosis not present

## 2019-03-15 DIAGNOSIS — J9601 Acute respiratory failure with hypoxia: Secondary | ICD-10-CM | POA: Diagnosis present

## 2019-03-15 DIAGNOSIS — J189 Pneumonia, unspecified organism: Secondary | ICD-10-CM | POA: Diagnosis present

## 2019-03-15 DIAGNOSIS — Z8719 Personal history of other diseases of the digestive system: Secondary | ICD-10-CM | POA: Diagnosis not present

## 2019-03-15 DIAGNOSIS — Z79899 Other long term (current) drug therapy: Secondary | ICD-10-CM

## 2019-03-15 DIAGNOSIS — Z8249 Family history of ischemic heart disease and other diseases of the circulatory system: Secondary | ICD-10-CM | POA: Diagnosis not present

## 2019-03-15 DIAGNOSIS — Z800 Family history of malignant neoplasm of digestive organs: Secondary | ICD-10-CM

## 2019-03-15 DIAGNOSIS — J988 Other specified respiratory disorders: Secondary | ICD-10-CM | POA: Diagnosis not present

## 2019-03-15 DIAGNOSIS — I10 Essential (primary) hypertension: Secondary | ICD-10-CM | POA: Diagnosis not present

## 2019-03-15 DIAGNOSIS — J1289 Other viral pneumonia: Secondary | ICD-10-CM | POA: Diagnosis present

## 2019-03-15 HISTORY — DX: Other specified health status: Z78.9

## 2019-03-15 LAB — CBC WITH DIFFERENTIAL/PLATELET
Abs Immature Granulocytes: 0.01 10*3/uL (ref 0.00–0.07)
Basophils Absolute: 0 10*3/uL (ref 0.0–0.1)
Basophils Relative: 0 %
Eosinophils Absolute: 0 10*3/uL (ref 0.0–0.5)
Eosinophils Relative: 1 %
HCT: 54.5 % — ABNORMAL HIGH (ref 39.0–52.0)
Hemoglobin: 17.8 g/dL — ABNORMAL HIGH (ref 13.0–17.0)
Immature Granulocytes: 0 %
Lymphocytes Relative: 28 %
Lymphs Abs: 1.5 10*3/uL (ref 0.7–4.0)
MCH: 28.9 pg (ref 26.0–34.0)
MCHC: 32.7 g/dL (ref 30.0–36.0)
MCV: 88.5 fL (ref 80.0–100.0)
Monocytes Absolute: 0.6 10*3/uL (ref 0.1–1.0)
Monocytes Relative: 10 %
Neutro Abs: 3.3 10*3/uL (ref 1.7–7.7)
Neutrophils Relative %: 61 %
Platelets: 167 10*3/uL (ref 150–400)
RBC: 6.16 MIL/uL — ABNORMAL HIGH (ref 4.22–5.81)
RDW: 14.1 % (ref 11.5–15.5)
WBC: 5.4 10*3/uL (ref 4.0–10.5)
nRBC: 0 % (ref 0.0–0.2)

## 2019-03-15 LAB — C-REACTIVE PROTEIN: CRP: 2.7 mg/dL — ABNORMAL HIGH (ref <1.0)

## 2019-03-15 LAB — BASIC METABOLIC PANEL
Anion gap: 11 (ref 5–15)
BUN: 15 mg/dL (ref 6–20)
CO2: 25 mmol/L (ref 22–32)
Calcium: 8.5 mg/dL — ABNORMAL LOW (ref 8.9–10.3)
Chloride: 98 mmol/L (ref 98–111)
Creatinine, Ser: 1.11 mg/dL (ref 0.61–1.24)
GFR calc Af Amer: >60 mL/min (ref >60)
GFR calc non Af Amer: >60 mL/min (ref >60)
Glucose, Bld: 103 mg/dL — ABNORMAL HIGH (ref 70–99)
Potassium: 4.3 mmol/L (ref 3.5–5.1)
Sodium: 134 mmol/L — ABNORMAL LOW (ref 135–145)

## 2019-03-15 LAB — PHOSPHORUS: Phosphorus: 2.7 mg/dL (ref 2.5–4.6)

## 2019-03-15 LAB — FERRITIN: Ferritin: 284 ng/mL (ref 24–336)

## 2019-03-15 LAB — D-DIMER, QUANTITATIVE: D-Dimer, Quant: 0.31 ug/mL-FEU (ref 0.00–0.50)

## 2019-03-15 LAB — MAGNESIUM: Magnesium: 2.5 mg/dL — ABNORMAL HIGH (ref 1.7–2.4)

## 2019-03-15 LAB — ABO/RH: ABO/RH(D): O POS

## 2019-03-15 MED ORDER — HYDROCOD POLST-CPM POLST ER 10-8 MG/5ML PO SUER: 5.0000 mL | Freq: Two times a day (BID) | ORAL | Status: DC | PRN

## 2019-03-15 MED ORDER — METHYLPREDNISOLONE SODIUM SUCC 125 MG IJ SOLR
60.0000 mg | Freq: Two times a day (BID) | INTRAMUSCULAR | Status: DC
  Administered 2019-03-15 – 2019-03-16 (×2): 60 mg via INTRAVENOUS
  Filled 2019-03-15 (×2): qty 2

## 2019-03-15 MED ORDER — SODIUM CHLORIDE 0.9 % IV BOLUS
500.0000 mL | Freq: Once | INTRAVENOUS | Status: AC
  Administered 2019-03-15: 500 mL via INTRAVENOUS

## 2019-03-15 MED ORDER — MORPHINE SULFATE (PF) 2 MG/ML IV SOLN: 1.0000 mg | INTRAVENOUS | Status: DC | PRN

## 2019-03-15 MED ORDER — ENOXAPARIN SODIUM 40 MG/0.4ML ~~LOC~~ SOLN
40.0000 mg | SUBCUTANEOUS | Status: DC
  Administered 2019-03-15 – 2019-03-16 (×2): 40 mg via SUBCUTANEOUS
  Filled 2019-03-15 (×2): qty 0.4

## 2019-03-15 MED ORDER — SODIUM CHLORIDE 0.9 % IV SOLN
100.0000 mg | INTRAVENOUS | Status: DC
  Administered 2019-03-16 – 2019-03-18 (×3): 100 mg via INTRAVENOUS
  Filled 2019-03-15 (×3): qty 20

## 2019-03-15 MED ORDER — GUAIFENESIN-DM 100-10 MG/5ML PO SYRP
10.0000 mL | ORAL_SOLUTION | ORAL | Status: DC | PRN
  Administered 2019-03-16: 10 mL via ORAL
  Filled 2019-03-15: qty 10

## 2019-03-15 MED ORDER — LORAZEPAM 2 MG/ML IJ SOLN
0.5000 mg | Freq: Once | INTRAMUSCULAR | Status: AC
  Administered 2019-03-15: 0.5 mg via INTRAVENOUS
  Filled 2019-03-15: qty 1

## 2019-03-15 MED ORDER — SODIUM CHLORIDE 0.9 % IV SOLN
200.0000 mg | Freq: Once | INTRAVENOUS | Status: AC
  Administered 2019-03-15: 200 mg via INTRAVENOUS
  Filled 2019-03-15: qty 40

## 2019-03-15 NOTE — ED Notes (Signed)
[facility] made aware of need for transport to [place] [facility].

## 2019-03-15 NOTE — ED Provider Notes (Signed)
[facility] DEPT Provider Note   CSN: [id number] Arrival date & time: 03/15/19  1333     History   Chief Complaint Chief Complaint  Patient presents with  . COVID +    HPI Jacob Foster is a 53 y.o. male.     53 year old male brought in by EMS from home.  Patient had a positive COVID test on August 26, symptom onset on August 25.  Patient reports worsening shortness of breath, chest congestion, unable to clear his chest congestion with coughing, fevers (taking Tylenol at home, unsure time of last dose). Patient feels generally weak, reports headaches, loss of sense of taste.  Patient is a non smoker, no history of asthma or chronic lung disease, no history of HTN, does not take any daily medications and is otherwise healthy. Patient reports feeling anxious at this time, no other complaints or concerns. Patient was placed on O2 via [place] by EMS for O2 of 91% on room air.  Jacob Foster was evaluated in Emergency Department on 03/15/2019 for the symptoms described in the history of present illness. He was evaluated in the context of the global COVID-19 pandemic, which necessitated consideration that the patient might be at risk for infection with the SARS-CoV-2 virus that causes COVID-19. Institutional protocols and algorithms that pertain to the evaluation of patients at risk for COVID-19 are in a state of rapid change based on information released by regulatory bodies including the CDC and federal and state organizations. These policies and algorithms were followed during the patient's care in the ED.      Past Medical History:  Diagnosis Date  . Abscess of axilla   . Arthritis   . Depression    seasonal affective D/O   . Family history of colon cancer 12/08/2018   Father and brother. Sees Dr. Enis Gash  . GERD (gastroesophageal reflux disease)   . Hidradenitis suppurativa   . History of chickenpox   . Hyperlipemia   . Neck pain   . Serrated polyp of colon  12/08/2018    Patient Active Problem List   Diagnosis Date Noted  . Family history of colon cancer 12/08/2018  . Serrated polyp of colon 12/08/2018  . Gastroesophageal reflux disease without esophagitis 08/20/2018  . GAD (generalized anxiety disorder) 04/23/2018  . Seasonal affective disorder ([place]) 04/23/2018  . Overweight (BMI 25.0-29.9) 09/05/2017    Past Surgical History:  Procedure Laterality Date  . ABSCESS DRAINAGE    . ELBOW SURGERY          Home Medications    Prior to Admission medications   Medication Sig Start Date End Date Taking? Authorizing Provider  chlorpheniramine-HYDROcodone (TUSSIONEX PENNKINETIC ER) 10-8 MG/5ML SUER Take 5 mLs by mouth every 12 (twelve) hours as needed. 03/14/19   Brunetta Jeans, PA-C    Family History Family History  Problem Relation Age of Onset  . Hypertension Mother   . Hyperlipidemia Mother   . Breast cancer Mother   . Prostate cancer Father   . Kidney disease Father   . Colon cancer Father        in his 3's  . Throat cancer Brother        dies age 72 - alcoholic   . Hypertension Brother   . Esophageal cancer Brother   . Healthy Daughter   . Healthy Son   . Healthy Son   . Healthy Son   . Diabetes Brother   . Colon cancer Brother 24  . Colon  cancer Brother 17  . Colon cancer Maternal Uncle   . Colon cancer Maternal Uncle   . Colon polyps Neg Hx   . Stomach cancer Neg Hx   . Rectal cancer Neg Hx     Social History Social History   Tobacco Use  . Smoking status: Never Smoker  . Smokeless tobacco: Never Used  Substance Use Topics  . Alcohol use: No  . Drug use: No     Allergies   Patient has no known allergies.   Review of Systems Review of Systems  Constitutional: Positive for fever.  HENT: Positive for congestion. Negative for sore throat.   Respiratory: Positive for cough and shortness of breath.   Cardiovascular: Negative for chest pain.  Gastrointestinal: Negative for abdominal pain,  constipation, diarrhea, nausea and vomiting.  Genitourinary: Negative for decreased urine volume and difficulty urinating.  Musculoskeletal: Negative for arthralgias and myalgias.  Skin: Negative for rash and wound.  Allergic/Immunologic: Negative for immunocompromised state.  Neurological: Positive for weakness and headaches.  Hematological: Negative for adenopathy.  Psychiatric/Behavioral: The patient is nervous/anxious.   All other systems reviewed and are negative.    Physical Exam Updated Vital Signs BP (!) 146/86 (BP Location: Left Arm)   Pulse 88   Temp 99.8 F (37.7 C) (Oral)   Resp 20   SpO2 97%   Physical Exam Vitals signs and nursing note reviewed.  Constitutional:      General: He is not in acute distress.    Appearance: He is well-developed. He is not diaphoretic.  HENT:     Head: Normocephalic and atraumatic.  Cardiovascular:     Rate and Rhythm: Normal rate and regular rhythm.     Pulses: Normal pulses.     Heart sounds: Normal heart sounds.  Pulmonary:     Effort: Pulmonary effort is normal.     Breath sounds: Normal breath sounds.  Abdominal:     Palpations: Abdomen is soft.     Tenderness: There is no abdominal tenderness.  Musculoskeletal:     Right lower leg: Edema present.     Left lower leg: Edema present.  Skin:    General: Skin is warm and dry.     Findings: No erythema or rash.  Neurological:     Mental Status: He is alert and oriented to person, place, and time.  Psychiatric:        Mood and Affect: Mood is anxious.        Behavior: Behavior normal.      ED Treatments / Results  Labs (all labs ordered are listed, but only abnormal results are displayed) Labs Reviewed  BASIC METABOLIC PANEL - Abnormal; Notable for the following components:      Result Value   Sodium 134 (*)    Glucose, Bld 103 (*)    Calcium 8.5 (*)    All other components within normal limits  CBC WITH DIFFERENTIAL/PLATELET - Abnormal; Notable for the following  components:   RBC 6.16 (*)    Hemoglobin 17.8 (*)    HCT 54.5 (*)    All other components within normal limits    EKG EKG Interpretation  Date/Time:  Tuesday March 15 2019 14:00:49 EDT Ventricular Rate:  87 PR Interval:    QRS Duration: 82 QT Interval:  363 QTC Calculation: 437 R Axis:   88 Text Interpretation:  Sinus rhythm Borderline ST elevation, anterior leads Confirmed by Dene Gentry [phone]) on 03/15/2019 3:02:35 PM   Radiology Dg Chest Port 1  View  Result Date: 03/15/2019 CLINICAL DATA:  Shortness of breath EXAM: PORTABLE CHEST 1 VIEW COMPARISON:  June 18, 2016 FINDINGS: There are few subtle airspace opacities for example at the right lung base. There is no pneumothorax. No large pleural effusion. The heart size is stable from prior study. There is no acute osseous abnormality. IMPRESSION: Scattered airspace opacities which can be seen in patients with an atypical pneumonia. Electronically Signed   By: Constance Holster M.D.   On: 03/15/2019 15:12    Procedures Procedures (including critical care time)  Medications Ordered in ED Medications  LORazepam (ATIVAN) injection 0.5 mg (0.5 mg Intravenous Given 03/15/19 1413)  sodium chloride 0.9 % bolus 500 mL (500 mLs Intravenous Bolus from Bag 03/15/19 1413)     Initial Impression / Assessment and Plan / ED Course  I have reviewed the triage vital signs and the nursing notes.  Pertinent labs & imaging results that were available during my care of the patient were reviewed by me and considered in my medical decision making (see chart for details).  Clinical Course as of Mar 14 1546  Tue Mar 14, 7552  8512 53 year old male with no significant past medical history presents with complaint of shortness of breath with known COVID positive test results 1 week ago.  Patient reports worsening chest congestion, feels like he is unable to clear the congestion with coughing and feeling short of breath today.  Patient reports  ongoing fevers, unknown last when he took Tylenol.  Patient is brought in by EMS, reports initial room air sat of 91% on their arrival and was placed on nasal cannula.  Patient is satting well on 3 L nasal cannula.  On initial exam patient is well-appearing, lung sounds are clear, patient is anxious.  Offered reassurance, patient agreeable with dose of Ativan to help with his anxiety.  On reassessment patient was feeling more relaxed.  Chest x-ray shows atypical pneumonia which seems consistent with his COVID diagnosis.  CBC and BMP without significant changes.  Case discussed with Dr. Karleen Hampshire with triad hospitalist for admission.  Patient is agreeable with plan of care.   [LM]    Clinical Course User Index [LM] Tacy Learn, PA-C      Final Clinical Impressions(s) / ED Diagnoses   Final diagnoses:  COVID-19  Atypical pneumonia    ED Discharge Orders    None       Tacy Learn, PA-C 03/15/19 1547    Valarie Merino, MD 03/18/19 1747

## 2019-03-15 NOTE — Telephone Encounter (Signed)
Wife dropped off FMLA paperwork - Charge sheet has been attached and placed in providers bin

## 2019-03-15 NOTE — Telephone Encounter (Signed)
Paperwork complete and faxed in.  Will make copy to scan into chart.  Original placed in file cabinet so we can either mail to them or they can pick up in a couple of weeks (cannot come in currently due to COVID infection)

## 2019-03-15 NOTE — ED Notes (Signed)
Report given to [place], Therapist, sports at [facility].

## 2019-03-15 NOTE — ED Triage Notes (Signed)
Pt presents with c/o shortness of breath. Pt was diagnosed with covid last Wednesday and reports his cough is worse and he has shortness of breath.

## 2019-03-15 NOTE — H&P (Signed)
History and Physical    Jacob Foster [id number] DOB: 19-Dec-1965 DOA: 03/15/2019  PCP: Brunetta Jeans, PA-C   Patient coming from: Home  I have personally briefly reviewed patient's old medical records in [place]  Chief Complaint: Shortness of breath since 4 days  HPI: Jacob Foster is a 53 y.o. male prior history of hyperlipidemia, hydradenitis suppurativa, GERD, was brought in by EMS for shortness of breath left-sided chest pain associated with coughing, fevers and chills since 4 days.  Patient also reports some headaches denies any blurry vision or sensory deficits.  He reports loss of taste and smell.  He reports some nausea but no vomiting or diarrhea or abdominal pain.  He denies any dysuria, orthopnea or PND or leg edema.  Left-sided chest pain tender on palpation nonradiating and comes with coughing.  He also reported he had a fainting spell today after persistent coughing for about 10 minutes.  And he was tested on 03/09/2019 and was positive for COVID-19.  ED Course: The ED he had a low-grade temperature heart rate of 88/min respiratory rate 20/min blood pressure 146/86.  Labs were significant for hemoglobin of 17.8.  Chest x-ray showed scattered airspace opacities consistent with COVID-19 infection/atypical pneumonia.  Patient was referred to medical service for admission for the management of COVID-19 illness.   Review of Systems: As per HPI otherwise  "All others reviewed and are negative,"  Past Medical History:  Diagnosis Date  . Abscess of axilla   . Arthritis   . Depression    seasonal affective D/O   . Family history of colon cancer 12/08/2018   Father and brother. Sees Dr. Enis Gash  . GERD (gastroesophageal reflux disease)   . Hidradenitis suppurativa   . History of chickenpox   . Hyperlipemia   . Neck pain   . Serrated polyp of colon 12/08/2018    Past Surgical History:  Procedure Laterality Date  . ABSCESS DRAINAGE    . ELBOW SURGERY     Social  history   reports that he has never smoked. He has never used smokeless tobacco. He reports that he does not drink alcohol or use drugs.  No Known Allergies  Family History  Problem Relation Age of Onset  . Hypertension Mother   . Hyperlipidemia Mother   . Breast cancer Mother   . Prostate cancer Father   . Kidney disease Father   . Colon cancer Father        in his 58's  . Throat cancer Brother        dies age 31 - alcoholic   . Hypertension Brother   . Esophageal cancer Brother   . Healthy Daughter   . Healthy Son   . Healthy Son   . Healthy Son   . Diabetes Brother   . Colon cancer Brother 44  . Colon cancer Brother 65  . Colon cancer Maternal Uncle   . Colon cancer Maternal Uncle   . Colon polyps Neg Hx   . Stomach cancer Neg Hx   . Rectal cancer Neg Hx   Family history reviewed and not pertinent   Prior to Admission medications   Medication Sig Start Date End Date Taking? Authorizing Provider  chlorpheniramine-HYDROcodone (TUSSIONEX PENNKINETIC ER) 10-8 MG/5ML SUER Take 5 mLs by mouth every 12 (twelve) hours as needed. Patient taking differently: Take 5 mLs by mouth every 12 (twelve) hours as needed for cough.  03/14/19  Yes Brunetta Jeans, PA-C    Physical Exam:  Constitutional: Mild distress from from shortness of breath Vitals:   03/15/19 1358 03/15/19 1530 03/15/19 1545 03/15/19 1549  BP: (!) 146/86 122/78    Pulse: 88 79 79   Resp: 20 17 19    Temp: 99.8 F (37.7 C)   99.4 F (37.4 C)  TempSrc: Oral   Oral  SpO2: 97% 100% 100%    Eyes: PERRL, lids and conjunctivae normal ENMT: Mucous membranes are moist. Neck: normal, supple, no masses, no thyromegaly Respiratory: Air entry diminished at bases but otherwise clear no wheezing or rhonchi Cardiovascular: Rate rhythm no murmurs or rubs, no pedal edema. Abdomen: Abdomen is soft, nontender, nondistended, bowel sounds are good Musculoskeletal: No pedal edema, cyanosis or clubbing Skin: no rashes,  lesions, ulcers. No induration Neurologic: CN 2-12 grossly intact. Sensation intact,  Psychiatric: Normal judgment and insight. Alert and oriented x 3. Normal mood.     Labs on Admission: I have personally reviewed following labs and imaging studies  CBC: Recent Labs  Lab 03/15/19 1412  WBC 5.4  NEUTROABS 3.3  HGB 17.8*  HCT 54.5*  MCV 88.5  PLT [id number]   Basic Metabolic Panel: Recent Labs  Lab 03/15/19 1412  NA 134*  K 4.3  CL 98  CO2 25  GLUCOSE 103*  BUN 15  CREATININE 1.11  CALCIUM 8.5*   GFR: CrCl cannot be calculated (Unknown ideal weight.). Liver Function Tests: No results for input(s): AST, ALT, ALKPHOS, BILITOT, PROT, ALBUMIN in the last 168 hours. No results for input(s): LIPASE, AMYLASE in the last 168 hours. No results for input(s): AMMONIA in the last 168 hours. Coagulation Profile: No results for input(s): INR, PROTIME in the last 168 hours. Cardiac Enzymes: No results for input(s): CKTOTAL, CKMB, CKMBINDEX, TROPONINI in the last 168 hours. BNP (last 3 results) No results for input(s): PROBNP in the last 8760 hours. HbA1C: No results for input(s): HGBA1C in the last 72 hours. CBG: No results for input(s): GLUCAP in the last 168 hours. Lipid Profile: No results for input(s): CHOL, HDL, LDLCALC, TRIG, CHOLHDL, LDLDIRECT in the last 72 hours. Thyroid Function Tests: No results for input(s): TSH, T4TOTAL, FREET4, T3FREE, THYROIDAB in the last 72 hours. Anemia Panel: No results for input(s): VITAMINB12, FOLATE, FERRITIN, TIBC, IRON, RETICCTPCT in the last 72 hours. Urine analysis:    Component Value Date/Time   BILIRUBINUR negative 05/07/2018 0950   PROTEINUR Positive (A) 05/07/2018 0950   UROBILINOGEN 0.2 05/07/2018 0950   NITRITE negative 05/07/2018 0950   LEUKOCYTESUR Negative 05/07/2018 0950    Radiological Exams on Admission: Dg Chest Port 1 View  Result Date: 03/15/2019 CLINICAL DATA:  Shortness of breath EXAM: PORTABLE CHEST 1 VIEW  COMPARISON:  June 18, 2016 FINDINGS: There are few subtle airspace opacities for example at the right lung base. There is no pneumothorax. No large pleural effusion. The heart size is stable from prior study. There is no acute osseous abnormality. IMPRESSION: Scattered airspace opacities which can be seen in patients with an atypical pneumonia. Electronically Signed   By: Constance Holster M.D.   On: 03/15/2019 15:12    EKG: Independently reviewed. Sinus rhythm.   Assessment/Plan Active Problems:   COVID-19 virus infection   Acute respiratory failure with hypoxia Fevers, chills, sob, left sided chest pain, left chest wall tenderness from coughing,  Corona virus positive and CXR showing atypical pneumonia.  Admit to [facility].  Start the patient on IV solumedrol and Remdesevir.  [place] oxygen to keep sats greater than 90%.  Cough meds, pain  control.  Inflammatory markers ordered.    Left sided chest pain from coughing and chest wall tenderness present. Pt denies any fall but reports he had persistent cough for 10  Minutes and had a fainting episode following the cough.  EKG does not show any ischemic changes.  Repeat EKG  later today .  Low suspicion for ACS.    DVT prophylaxis: lovenox.  Code Status: full code.  Family Communication: none at bedside.  Disposition Plan: pending clinical improvement.  Consults called: none Admission status: inpatient/ med surg.    Hosie Poisson MD [facility] Pager [phone]   If 7PM-7AM, please contact night-coverage [email or web address] Password [facility]  03/15/2019, 4:36 PM

## 2019-03-15 NOTE — Telephone Encounter (Signed)
Spoke with patients wife. She requested that I mail paperwork to them. Papers have been put in to be mailed out this afternoon.

## 2019-03-16 ENCOUNTER — Encounter: Payer: Self-pay | Admitting: *Deleted

## 2019-03-16 ENCOUNTER — Telehealth: Payer: Self-pay | Admitting: *Deleted

## 2019-03-16 DIAGNOSIS — J988 Other specified respiratory disorders: Secondary | ICD-10-CM

## 2019-03-16 LAB — CBC WITH DIFFERENTIAL/PLATELET
Abs Immature Granulocytes: 0.02 10*3/uL (ref 0.00–0.07)
Basophils Absolute: 0 10*3/uL (ref 0.0–0.1)
Basophils Relative: 1 %
Eosinophils Absolute: 0 10*3/uL (ref 0.0–0.5)
Eosinophils Relative: 0 %
HCT: 54 % — ABNORMAL HIGH (ref 39.0–52.0)
Hemoglobin: 17.6 g/dL — ABNORMAL HIGH (ref 13.0–17.0)
Immature Granulocytes: 1 %
Lymphocytes Relative: 26 %
Lymphs Abs: 0.7 10*3/uL (ref 0.7–4.0)
MCH: 28.9 pg (ref 26.0–34.0)
MCHC: 32.6 g/dL (ref 30.0–36.0)
MCV: 88.8 fL (ref 80.0–100.0)
Monocytes Absolute: 0.1 10*3/uL (ref 0.1–1.0)
Monocytes Relative: 4 %
Neutro Abs: 2 10*3/uL (ref 1.7–7.7)
Neutrophils Relative %: 68 %
Platelets: 161 10*3/uL (ref 150–400)
RBC: 6.08 MIL/uL — ABNORMAL HIGH (ref 4.22–5.81)
RDW: 14.1 % (ref 11.5–15.5)
WBC: 2.9 10*3/uL — ABNORMAL LOW (ref 4.0–10.5)
nRBC: 0 % (ref 0.0–0.2)

## 2019-03-16 LAB — COMPREHENSIVE METABOLIC PANEL WITH GFR
ALT: 50 U/L — ABNORMAL HIGH (ref 0–44)
AST: 46 U/L — ABNORMAL HIGH (ref 15–41)
Albumin: 3.8 g/dL (ref 3.5–5.0)
Alkaline Phosphatase: 74 U/L (ref 38–126)
Anion gap: 10 (ref 5–15)
BUN: 17 mg/dL (ref 6–20)
CO2: 26 mmol/L (ref 22–32)
Calcium: 8.7 mg/dL — ABNORMAL LOW (ref 8.9–10.3)
Chloride: 101 mmol/L (ref 98–111)
Creatinine, Ser: 0.99 mg/dL (ref 0.61–1.24)
GFR calc Af Amer: >60 mL/min
GFR calc non Af Amer: >60 mL/min
Glucose, Bld: 135 mg/dL — ABNORMAL HIGH (ref 70–99)
Potassium: 4.6 mmol/L (ref 3.5–5.1)
Sodium: 137 mmol/L (ref 135–145)
Total Bilirubin: 0.8 mg/dL (ref 0.3–1.2)
Total Protein: 7.9 g/dL (ref 6.5–8.1)

## 2019-03-16 LAB — FERRITIN: Ferritin: 260 ng/mL (ref 24–336)

## 2019-03-16 LAB — D-DIMER, QUANTITATIVE: D-Dimer, Quant: 0.38 ug/mL-FEU (ref 0.00–0.50)

## 2019-03-16 LAB — MAGNESIUM: Magnesium: 2.7 mg/dL — ABNORMAL HIGH (ref 1.7–2.4)

## 2019-03-16 LAB — PHOSPHORUS: Phosphorus: 2 mg/dL — ABNORMAL LOW (ref 2.5–4.6)

## 2019-03-16 LAB — C-REACTIVE PROTEIN: CRP: 3.8 mg/dL — ABNORMAL HIGH

## 2019-03-16 MED ORDER — GUAIFENESIN-CODEINE 100-10 MG/5ML PO SOLN
10.0000 mL | Freq: Four times a day (QID) | ORAL | Status: DC | PRN
  Administered 2019-03-18: 10 mL via ORAL
  Filled 2019-03-16: qty 10

## 2019-03-16 MED ORDER — ENSURE ENLIVE PO LIQD
237.0000 mL | Freq: Two times a day (BID) | ORAL | Status: DC
  Administered 2019-03-17 – 2019-03-19 (×5): 237 mL via ORAL

## 2019-03-16 MED ORDER — HYDROCOD POLST-CPM POLST ER 10-8 MG/5ML PO SUER
5.0000 mL | Freq: Two times a day (BID) | ORAL | Status: DC
  Administered 2019-03-16 – 2019-03-19 (×7): 5 mL via ORAL
  Filled 2019-03-16 (×7): qty 5

## 2019-03-16 MED ORDER — METHYLPREDNISOLONE SODIUM SUCC 40 MG IJ SOLR
40.0000 mg | Freq: Two times a day (BID) | INTRAMUSCULAR | Status: DC
  Administered 2019-03-16 – 2019-03-19 (×6): 40 mg via INTRAVENOUS
  Filled 2019-03-16 (×6): qty 1

## 2019-03-16 NOTE — Progress Notes (Signed)
Patient's wife updated on patient condition via telephone call. States she has no questions at this time.

## 2019-03-16 NOTE — Telephone Encounter (Signed)
There is no point addending them further until he gets out of the hospital and has a follow-up. We can print and send a letter starting that initial FMLA RTW date will likely have to be extended as patient is hospitalized as of yesterday and that we will take care of this as soon as new RTW date can be determined

## 2019-03-16 NOTE — Progress Notes (Signed)
Initial Nutrition Assessment  RD working remotely.  DOCUMENTATION CODES:   Not applicable  INTERVENTION:   - Vital Cuisine Shake daily with breakfast meal, each supplement provides 520 kcal and 22 grams of protein  - Magic cup BID with lunch and dinner meals, each supplement provides 290 kcal and 9 grams of protein  - Ensure Enlive po BID, each supplement provides 350 kcal and 20 grams of protein  - Liberalize diet to Regular, verbal with readback order placed per Dr. Candiss Norse  NUTRITION DIAGNOSIS:   Increased nutrient needs related to acute illness (COVID-19) as evidenced by estimated needs.  GOAL:   Patient will meet greater than or equal to 90% of their needs  MONITOR:   PO intake, Supplement acceptance, Labs, Weight trends  REASON FOR ASSESSMENT:   Malnutrition Screening Tool    ASSESSMENT:   53 year old male who presented to the ED on 9/01 with SOB. Pt with positive COVID-19 test on 8/26. PMH of depression, GERD, HLD, hydradenitis suppurativa.   No meal completions recorded at this time.  Noted pt complained of loss of taste on admit.  Reviewed weight history in chart. Pt's weight has fluctuated between 83-87 kg over the last 1 year. Noted pt weighed 86.6 kg on 6/17 and 83.9 kg on admit. This is a 2.7 kg weight loss (3.1%) which is not significant for timeframe.  RD will order Ensure Enlive BID between meals to aid pt in meeting kcal and protein needs given increased nutrient needs.  Medications reviewed and include: Solu-Medrol, Remdesivir  Labs reviewed: phosphorus 2.0, magnesium 2.5, elevated LFTs  NUTRITION - FOCUSED PHYSICAL EXAM:  Unable to complete at this time. RD working remotely.  Diet Order:   Diet Order            Diet regular Room service appropriate? Yes; Fluid consistency: Thin  Diet effective now              EDUCATION NEEDS:   No education needs have been identified at this time  Skin:  Skin Assessment: Reviewed RN  Assessment  Last BM:  03/11/19  Height:   Ht Readings from Last 1 Encounters:  03/15/19 5\' 8"  (1.727 m)    Weight:   Wt Readings from Last 1 Encounters:  03/15/19 83.9 kg    Ideal Body Weight:  70 kg  BMI:  Body mass index is 28.13 kg/m.  Estimated Nutritional Needs:   Kcal:  2200-2400  Protein:  105-120 grams  Fluid:  >/= 2.0 L    Gaynell Face, MS, RD, LDN Inpatient Clinical Dietitian Pager: [phone] Weekend/After Hours: [phone]

## 2019-03-16 NOTE — Telephone Encounter (Signed)
Letter has been faxed to [facility] at [phone] Case # [id number] [facility] # [phone]

## 2019-03-16 NOTE — Progress Notes (Signed)
Patient alert, oriented, and agreeable with plan of care. He will update his family and let nursing know if any questions arise.

## 2019-03-16 NOTE — Telephone Encounter (Signed)
Pt wife called in asking to speak to [facility] return her phone call at [phone]

## 2019-03-16 NOTE — Progress Notes (Signed)
  PT Cancellation Note  Patient Details Name: Jacob Foster MRN: [id number] DOB: 1965/09/14   Cancelled Treatment:    Reason Eval/Treat Not Completed: PT screened, no needs identified, will sign off, per RN.   Claretha Cooper 03/16/2019, 3:32 PM

## 2019-03-16 NOTE — Progress Notes (Signed)
                                  PROGRESS NOTE                                                                                                                                                                                                             Patient Demographics:    Jacob Foster, is a 52 y.o. male, DOB - 10/22/1965, MRN:[id number]  Outpatient Primary MD for the patient is Martin, William C, PA-C    LOS - 1  Admit date - 03/15/2019    Chief Complaint  Patient presents with  . COVID       Brief Narrative  - Jacob Foster is a 52 y.o. male prior history of hyperlipidemia, hydradenitis suppurativa, GERD, was brought in by EMS for shortness of breath left-sided chest pain associated with coughing, fevers and chills since 4 days.  Patient also reports some headaches denies any blurry vision or sensory deficits.  He reports loss of taste and smell.  He reports some nausea but no vomiting or diarrhea or abdominal pain.  He denies any dysuria, orthopnea or PND or leg edema.  Left-sided chest pain tender on palpation nonradiating and comes with coughing.  He also reported he had a fainting spell today after persistent coughing for about 10 minutes.  And he was tested on 03/09/2019 and was positive for COVID-19.   Subjective:    Jacob Foster today has, No headache, No chest pain, No abdominal pain - No Nausea, No new weakness tingling or numbness, ++ Cough - mild SOB.    Assessment  & Plan :     1. Acute Hypoxic Resp. Failure due to Acute Covid 19 Viral Pneumonitis during the ongoing 2020 Covid 19 Pandemic - he has I will to moderate disease and hypoxia, has been started on steroids and Remdisvir combination, inflammatory markers will be monitored, clinically his main subjective complaint is extreme cough for which supportive care will be provided.  He has been encouraged to sit up in chair use flutter valve and I-S for pulmonary toiletry in the morning and then prone  at night when in bed.  Will continue to monitor closely.  Actemra off label use - patient was told that if COVID-19 pneumonitis gets worse we might potentially use Actemra off label, she denies any known history of tuberculosis or hepatitis, understands the risks and benefits and wants to proceed with   Actemra treatment if required.   COVID-19 Labs  Recent Labs    03/15/19 1744 03/16/19 0335  DDIMER 0.31  --   FERRITIN 284 260  CRP 2.7* 3.8*    Lab Results  Component Value Date   SARSCOV2NAA Detected (A) 03/09/2019     Hepatic Function Latest Ref Rng & Units 03/16/2019 12/08/2018 05/07/2018  Total Protein 6.5 - 8.1 g/dL 7.9 7.6 7.5  Albumin 3.5 - 5.0 g/dL 3.8 4.5 4.7  AST 15 - 41 U/L 46(H) 22 33  ALT 0 - 44 U/L 50(H) 27 32  Alk Phosphatase 38 - 126 U/L 74 69 78  Total Bilirubin 0.3 - 1.2 mg/dL 0.8 0.8 0.8     No results found for: BNP    2.  Underlying history of hidradenitis suppurativa - continue home regimen.  3.  Underlying history of GERD and dyslipidemia.  Not on any medications will monitor and follow with PCP.    Condition - Extremely Guarded  Family Communication  : None  Code Status : Full  Diet :   Diet Order            Diet Heart Room service appropriate? Yes; Fluid consistency: Thin  Diet effective now               Disposition Plan  :  Home once better  Consults  :  None  Procedures  :     PUD Prophylaxis :    DVT Prophylaxis  :  Lovenox   Lab Results  Component Value Date   PLT 161 03/16/2019    Inpatient Medications  Scheduled Meds: . chlorpheniramine-HYDROcodone  5 mL Oral Q12H  . enoxaparin (LOVENOX) injection  40 mg Subcutaneous Q24H  . methylPREDNISolone (SOLU-MEDROL) injection  40 mg Intravenous Q12H   Continuous Infusions: . remdesivir 100 mg in NS 250 mL     PRN Meds:.guaiFENesin-codeine, morphine injection  Antibiotics  :    Anti-infectives (From admission, onward)   Start     Dose/Rate Route Frequency Ordered  Stop   03/16/19 2100  remdesivir 100 mg in sodium chloride 0.9 % 250 mL IVPB     100 mg 500 mL/hr over 30 Minutes Intravenous Every 24 hours 03/15/19 1956 03/20/19 2059   03/15/19 2100  remdesivir 200 mg in sodium chloride 0.9 % 250 mL IVPB     200 mg 500 mL/hr over 30 Minutes Intravenous Once 03/15/19 1956 03/15/19 2145       Time Spent in minutes  30   Lala Lund M.D on 03/16/2019 at 8:49 AM  To page go to [email or web address] - password [place]  [facility] -  Office  [phone]   See all Orders from today for further details    Objective:   Vitals:   03/15/19 2033 03/15/19 2035 03/16/19 0500 03/16/19 0828  BP:   117/82 135/82  Pulse: 77  74 66  Resp: (!) 21  (!) 23 (!) 21  Temp:   98.2 F (36.8 C) 99.8 F (37.7 C)  TempSrc:   Oral Oral  SpO2: 95%  95% 96%  Weight:  83.9 kg    Height:  5' 8" (1.727 m)      Wt Readings from Last 3 Encounters:  03/15/19 83.9 kg  12/29/18 86.6 kg  12/28/18 86.8 kg     Intake/Output Summary (Last 24 hours) at 03/16/2019 0849 Last data filed at 03/16/2019 0500 Gross per 24 hour  Intake 250 ml  Output 400 ml  Net -150 ml  Physical Exam  Awake Alert, Oriented X 3, No new F.N deficits, Normal affect [place].AT,PERRAL Supple Neck,No JVD, No cervical lymphadenopathy appriciated.  Symmetrical Chest wall movement, Good air movement bilaterally, CTAB RRR,No Gallops,Rubs or new Murmurs, No Parasternal Heave +ve B.Sounds, Abd Soft, No tenderness, No organomegaly appriciated, No rebound - guarding or rigidity. No Cyanosis, Clubbing or edema, No new Rash or bruise     Data Review:    CBC Recent Labs  Lab 03/15/19 1412 03/16/19 0335  WBC 5.4 2.9*  HGB 17.8* 17.6*  HCT 54.5* 54.0*  PLT 167 161  MCV 88.5 88.8  MCH 28.9 28.9  MCHC 32.7 32.6  RDW 14.1 14.1  LYMPHSABS 1.5 0.7  MONOABS 0.6 0.1  EOSABS 0.0 0.0  BASOSABS 0.0 0.0    Chemistries  Recent Labs  Lab 03/15/19 1412 03/15/19 1744 03/16/19 0335  NA 134*  --   137  K 4.3  --  4.6  CL 98  --  101  CO2 25  --  26  GLUCOSE 103*  --  135*  BUN 15  --  17  CREATININE 1.11  --  0.99  CALCIUM 8.5*  --  8.7*  MG  --  2.5* 2.7*  AST  --   --  46*  ALT  --   --  50*  ALKPHOS  --   --  74  BILITOT  --   --  0.8   ------------------------------------------------------------------------------------------------------------------ No results for input(s): CHOL, HDL, LDLCALC, TRIG, CHOLHDL, LDLDIRECT in the last 72 hours.  No results found for: HGBA1C ------------------------------------------------------------------------------------------------------------------ No results for input(s): TSH, T4TOTAL, T3FREE, THYROIDAB in the last 72 hours.  Invalid input(s): FREET3  Cardiac Enzymes No results for input(s): CKMB, TROPONINI, MYOGLOBIN in the last 168 hours.  Invalid input(s): CK ------------------------------------------------------------------------------------------------------------------ No results found for: BNP  Micro Results Recent Results (from the past 240 hour(s))  Novel Coronavirus, NAA ([facility])     Status: Abnormal   Collection Time: 03/09/19 12:00 AM   Specimen: Oropharyngeal(OP) collection in vial transport medium   OROPHARYNGEA  TESTING  Result Value Ref Range Status   SARS-CoV-2, NAA Detected (A) Not Detected Final    Comment: This test was developed and its performance characteristics determined by [employer]. This test has not been FDA cleared or approved. This test has been authorized by FDA under an Emergency Use Authorization (EUA). This test is only authorized for the duration of time the declaration that circumstances exist justifying the authorization of the emergency use of in vitro diagnostic tests for detection of SARS-CoV-2 virus and/or diagnosis of COVID-19 infection under section 564(b)(1) of the Act, 21 U.S.C. 419QQI-2(L)(7), unless the authorization is terminated or revoked sooner. When diagnostic  testing is negative, the possibility of a false negative result should be considered in the context of a patient's recent exposures and the presence of clinical signs and symptoms consistent with COVID-19. An individual without symptoms of COVID-19 and who is not shedding SARS-CoV-2 virus would expect to have a negative (not detected) result in this assay.     Radiology Reports Dg Chest Port 1 View  Result Date: 03/15/2019 CLINICAL DATA:  Shortness of breath EXAM: PORTABLE CHEST 1 VIEW COMPARISON:  June 18, 2016 FINDINGS: There are few subtle airspace opacities for example at the right lung base. There is no pneumothorax. No large pleural effusion. The heart size is stable from prior study. There is no acute osseous abnormality. IMPRESSION: Scattered airspace opacities which can be seen in patients with  an atypical pneumonia. Electronically Signed   By: Constance Holster M.D.   On: 03/15/2019 15:12

## 2019-03-16 NOTE — Telephone Encounter (Signed)
Patient's wife called to let us know that patient has been admitted to [facility] for [place] as last night get had difficulty breathing. She spoke with his job who noted that the FMLA forms that they received states that his FMLA will end on 03/20/2019 - now that he is admitted they are saying the forms need to be updated. She is asking if this is something that we will handle.  If so, she states that she can email the forms over since she cannot come into the office. Routing to PCP to advise on how we need to handle this as we cannot determine when he would be released to go back to work.

## 2019-03-16 NOTE — Progress Notes (Signed)
Ambulated 250 ft on room air, maintained O2 sats> 95%

## 2019-03-17 LAB — BRAIN NATRIURETIC PEPTIDE: B Natriuretic Peptide: 16.4 pg/mL (ref 0.0–100.0)

## 2019-03-17 LAB — CBC WITH DIFFERENTIAL/PLATELET
Abs Immature Granulocytes: 0.04 10*3/uL (ref 0.00–0.07)
Basophils Absolute: 0 10*3/uL (ref 0.0–0.1)
Basophils Relative: 0 %
Eosinophils Absolute: 0 10*3/uL (ref 0.0–0.5)
Eosinophils Relative: 0 %
HCT: 52 % (ref 39.0–52.0)
Hemoglobin: 16.9 g/dL (ref 13.0–17.0)
Immature Granulocytes: 1 %
Lymphocytes Relative: 15 %
Lymphs Abs: 1.2 10*3/uL (ref 0.7–4.0)
MCH: 28.4 pg (ref 26.0–34.0)
MCHC: 32.5 g/dL (ref 30.0–36.0)
MCV: 87.4 fL (ref 80.0–100.0)
Monocytes Absolute: 0.5 10*3/uL (ref 0.1–1.0)
Monocytes Relative: 6 %
Neutro Abs: 6.3 10*3/uL (ref 1.7–7.7)
Neutrophils Relative %: 78 %
Platelets: 199 10*3/uL (ref 150–400)
RBC: 5.95 MIL/uL — ABNORMAL HIGH (ref 4.22–5.81)
RDW: 13.7 % (ref 11.5–15.5)
WBC: 8 10*3/uL (ref 4.0–10.5)
nRBC: 0 % (ref 0.0–0.2)

## 2019-03-17 LAB — COMPREHENSIVE METABOLIC PANEL
ALT: 40 U/L (ref 0–44)
AST: 31 U/L (ref 15–41)
Albumin: 3.6 g/dL (ref 3.5–5.0)
Alkaline Phosphatase: 67 U/L (ref 38–126)
Anion gap: 10 (ref 5–15)
BUN: 18 mg/dL (ref 6–20)
CO2: 26 mmol/L (ref 22–32)
Calcium: 8.7 mg/dL — ABNORMAL LOW (ref 8.9–10.3)
Chloride: 102 mmol/L (ref 98–111)
Creatinine, Ser: 0.88 mg/dL (ref 0.61–1.24)
GFR calc Af Amer: >60 mL/min (ref >60)
GFR calc non Af Amer: >60 mL/min (ref >60)
Glucose, Bld: 96 mg/dL (ref 70–99)
Potassium: 4.5 mmol/L (ref 3.5–5.1)
Sodium: 138 mmol/L (ref 135–145)
Total Bilirubin: 0.7 mg/dL (ref 0.3–1.2)
Total Protein: 7.2 g/dL (ref 6.5–8.1)

## 2019-03-17 LAB — C-REACTIVE PROTEIN: CRP: 1.1 mg/dL — ABNORMAL HIGH (ref <1.0)

## 2019-03-17 LAB — MAGNESIUM: Magnesium: 2.4 mg/dL (ref 1.7–2.4)

## 2019-03-17 LAB — D-DIMER, QUANTITATIVE: D-Dimer, Quant: 0.31 ug/mL-FEU (ref 0.00–0.50)

## 2019-03-17 LAB — FERRITIN: Ferritin: 234 ng/mL (ref 24–336)

## 2019-03-17 LAB — LACTATE DEHYDROGENASE: LDH: 196 U/L — ABNORMAL HIGH (ref 98–192)

## 2019-03-17 MED ORDER — ACETAMINOPHEN 325 MG PO TABS: 650.0000 mg | ORAL_TABLET | Freq: Four times a day (QID) | ORAL | Status: DC | PRN

## 2019-03-17 MED ORDER — ENOXAPARIN SODIUM 40 MG/0.4ML ~~LOC~~ SOLN
40.0000 mg | SUBCUTANEOUS | Status: DC
  Administered 2019-03-17 – 2019-03-18 (×2): 40 mg via SUBCUTANEOUS
  Filled 2019-03-17 (×2): qty 0.4

## 2019-03-17 MED ORDER — LACTATED RINGERS IV SOLN
INTRAVENOUS | Status: DC
  Administered 2019-03-17: 10:00:00 via INTRAVENOUS

## 2019-03-17 NOTE — Progress Notes (Signed)
Ok to resume the prophylaxis Lovenox order per Dr Candiss Norse.  Onnie Boer, PharmD, BCIDP, AAHIVP, CPP Infectious Disease Pharmacist 03/17/2019 9:05 AM

## 2019-03-17 NOTE — Progress Notes (Signed)
Pt's wife Jacob Foster called and updated on POC. Questions answered.

## 2019-03-17 NOTE — Progress Notes (Signed)
PROGRESS NOTE                                                                                                                                                                                                             Patient Demographics:    Jacob Foster, is a 53 y.o. male, DOB - [birth year], ZOX:[id number]  Outpatient Primary MD for the patient is Delorse Limber    LOS - 2  Admit date - 03/15/2019    Chief Complaint  Patient presents with  . COVID       Brief Narrative  - Tiron Suski is a 53 y.o. male prior history of hyperlipidemia, hydradenitis suppurativa, GERD, was brought in by EMS for shortness of breath left-sided chest pain associated with coughing, fevers and chills since 4 days.  Patient also reports some headaches denies any blurry vision or sensory deficits.  He reports loss of taste and smell.  He reports some nausea but no vomiting or diarrhea or abdominal pain.  He denies any dysuria, orthopnea or PND or leg edema.  Left-sided chest pain tender on palpation nonradiating and comes with coughing.  He also reported he had a fainting spell today after persistent coughing for about 10 minutes.  And he was tested on 03/09/2019 and was positive for COVID-19.   Subjective:   Patient in bed, appears comfortable, denies any headache, no fever, no chest pain or pressure, improved cough and shortness of breath , no abdominal pain. No focal weakness.   Assessment  & Plan :     1. Acute Hypoxic Resp. Failure due to Acute Covid 19 Viral Pneumonitis during the ongoing 2020 Covid 19 Pandemic - he has I will to moderate disease and hypoxia, has been started on steroids and Remdisvir combination, inflammatory markers will be monitored, clinically his main subjective complaint is extreme cough for which supportive care will be provided.  He has been encouraged to sit up in chair use flutter valve and I-S for pulmonary toiletry  in the morning and then prone at night when in bed.  Will continue to monitor closely.  Is clinically much improved continue steroids and Remdisvir combination finish the course and then discharge home.  Actemra off label use - patient was told that if COVID-19 pneumonitis gets worse we might potentially use Actemra off label, she denies  any known history of tuberculosis or hepatitis, understands the risks and benefits and wants to proceed with Actemra treatment if required.   COVID-19 Labs  Recent Labs    03/15/19 1744 03/16/19 0335 03/17/19 0313  DDIMER 0.31 0.38 0.31  FERRITIN 284 260 234  LDH  --   --  196*  CRP 2.7* 3.8* 1.1*    Lab Results  Component Value Date   SARSCOV2NAA Detected (A) 03/09/2019     Hepatic Function Latest Ref Rng & Units 03/17/2019 03/16/2019 12/08/2018  Total Protein 6.5 - 8.1 g/dL 7.2 7.9 7.6  Albumin 3.5 - 5.0 g/dL 3.6 3.8 4.5  AST 15 - 41 U/L 31 46(H) 22  ALT 0 - 44 U/L 40 50(H) 27  Alk Phosphatase 38 - 126 U/L 67 74 69  Total Bilirubin 0.3 - 1.2 mg/dL 0.7 0.8 0.8        Component Value Date/Time   BNP 16.4 03/17/2019 0313      2.  Underlying history of hidradenitis suppurativa - continue home regimen.  3.  Underlying history of GERD and dyslipidemia.  Not on any medications will monitor and follow with PCP.   4.  Dehydration.  Hydrate with IV fluids on 03/17/2019 and monitor.   Condition - Fair  Family Communication  : None  Code Status : Full  Diet :   Diet Order            Diet regular Room service appropriate? Yes; Fluid consistency: Thin  Diet effective now               Disposition Plan  :  Home once better  Consults  :  None  Procedures  :     PUD Prophylaxis :    DVT Prophylaxis  :  Lovenox   Lab Results  Component Value Date   PLT 199 03/17/2019    Inpatient Medications  Scheduled Meds: . chlorpheniramine-HYDROcodone  5 mL Oral Q12H  . enoxaparin (LOVENOX) injection  40 mg Subcutaneous Q24H  .  feeding supplement (ENSURE ENLIVE)  237 mL Oral BID BM  . methylPREDNISolone (SOLU-MEDROL) injection  40 mg Intravenous Q12H   Continuous Infusions: . lactated ringers    . remdesivir 100 mg in NS 250 mL Stopped (03/16/19 2132)   PRN Meds:.acetaminophen, guaiFENesin-codeine  Antibiotics  :    Anti-infectives (From admission, onward)   Start     Dose/Rate Route Frequency Ordered Stop   03/16/19 2100  remdesivir 100 mg in sodium chloride 0.9 % 250 mL IVPB     100 mg 500 mL/hr over 30 Minutes Intravenous Every 24 hours 03/15/19 1956 03/20/19 2059   03/15/19 2100  remdesivir 200 mg in sodium chloride 0.9 % 250 mL IVPB     200 mg 500 mL/hr over 30 Minutes Intravenous Once 03/15/19 1956 03/15/19 2145       Time Spent in minutes  30   Lala Lund M.D on 03/17/2019 at 9:13 AM  To page go to [email or web address] - password [email or web address]  [facility] -  Office  [phone]   See all Orders from today for further details    Objective:   Vitals:   03/17/19 0415 03/17/19 0500 03/17/19 0600 03/17/19 0833  BP: 129/77   103/74  Pulse: 81 61 66 88  Resp: 15 16 15 20   Temp: 97.7 F (36.5 C)   98.3 F (36.8 C)  TempSrc: Oral   Oral  SpO2: 96% 94% 93% 96%  Weight:  Height:        Wt Readings from Last 3 Encounters:  03/15/19 83.9 kg  12/29/18 86.6 kg  12/28/18 86.8 kg     Intake/Output Summary (Last 24 hours) at 03/17/2019 0913 Last data filed at 03/17/2019 0600 Gross per 24 hour  Intake 350 ml  Output 775 ml  Net -425 ml     Physical Exam  Awake Alert, Oriented X 3, No new F.N deficits, Normal affect [place].AT,PERRAL Supple Neck,No JVD, No cervical lymphadenopathy appriciated.  Symmetrical Chest wall movement, Good air movement bilaterally, CTAB RRR,No Gallops, Rubs or new Murmurs, No Parasternal Heave +ve B.Sounds, Abd Soft, No tenderness, No organomegaly appriciated, No rebound - guarding or rigidity. No Cyanosis, Clubbing or edema, No new Rash or bruise    Data  Review:    CBC Recent Labs  Lab 03/15/19 1412 03/16/19 0335 03/17/19 0313  WBC 5.4 2.9* 8.0  HGB 17.8* 17.6* 16.9  HCT 54.5* 54.0* 52.0  PLT 167 161 199  MCV 88.5 88.8 87.4  MCH 28.9 28.9 28.4  MCHC 32.7 32.6 32.5  RDW 14.1 14.1 13.7  LYMPHSABS 1.5 0.7 1.2  MONOABS 0.6 0.1 0.5  EOSABS 0.0 0.0 0.0  BASOSABS 0.0 0.0 0.0    Chemistries  Recent Labs  Lab 03/15/19 1412 03/15/19 1744 03/16/19 0335 03/17/19 0313  NA 134*  --  137 138  K 4.3  --  4.6 4.5  CL 98  --  101 102  CO2 25  --  26 26  GLUCOSE 103*  --  135* 96  BUN 15  --  17 18  CREATININE 1.11  --  0.99 0.88  CALCIUM 8.5*  --  8.7* 8.7*  MG  --  2.5* 2.7* 2.4  AST  --   --  46* 31  ALT  --   --  50* 40  ALKPHOS  --   --  74 67  BILITOT  --   --  0.8 0.7   ------------------------------------------------------------------------------------------------------------------ No results for input(s): CHOL, HDL, LDLCALC, TRIG, CHOLHDL, LDLDIRECT in the last 72 hours.  No results found for: HGBA1C ------------------------------------------------------------------------------------------------------------------ No results for input(s): TSH, T4TOTAL, T3FREE, THYROIDAB in the last 72 hours.  Invalid input(s): FREET3  Cardiac Enzymes No results for input(s): CKMB, TROPONINI, MYOGLOBIN in the last 168 hours.  Invalid input(s): CK ------------------------------------------------------------------------------------------------------------------    Component Value Date/Time   BNP 16.4 03/17/2019 0313    Micro Results Recent Results (from the past 240 hour(s))  Novel Coronavirus, NAA ([facility])     Status: Abnormal   Collection Time: 03/09/19 12:00 AM   Specimen: Oropharyngeal(OP) collection in vial transport medium   OROPHARYNGEA  TESTING  Result Value Ref Range Status   SARS-CoV-2, NAA Detected (A) Not Detected Final    Comment: This test was developed and its performance characteristics determined by [employer]. This test has not been FDA cleared or approved. This test has been authorized by FDA under an Emergency Use Authorization (EUA). This test is only authorized for the duration of time the declaration that circumstances exist justifying the authorization of the emergency use of in vitro diagnostic tests for detection of SARS-CoV-2 virus and/or diagnosis of COVID-19 infection under section 564(b)(1) of the Act, 21 U.S.C. 244QKM-6(N)(8), unless the authorization is terminated or revoked sooner. When diagnostic testing is negative, the possibility of a false negative result should be considered in the context of a patient's recent exposures and the presence of clinical signs and symptoms consistent with COVID-19. An  individual without symptoms of COVID-19 and who is not shedding SARS-CoV-2 virus would expect to have a negative (not detected) result in this assay.   Culture, blood (Routine X 2) w Reflex to ID Panel     Status: None (Preliminary result)   Collection Time: 03/15/19  5:30 PM   Specimen: BLOOD RIGHT HAND  Result Value Ref Range Status   Specimen Description   Final    BLOOD RIGHT HAND Performed at [facility] [address], [place], [place] [address]    Special Requests   Final    BOTTLES DRAWN AEROBIC AND ANAEROBIC Blood Culture adequate volume Performed at [facility] [address], [place], [place] [address]    Culture   Final    NO GROWTH < 24 HOURS Performed at [facility] [address] [place], [place] [address]    Report Status PENDING  Incomplete  Culture, blood (Routine X 2) w Reflex to ID Panel     Status: None (Preliminary result)   Collection Time: 03/15/19  5:40 PM   Specimen: BLOOD  Result Value Ref Range Status   Specimen Description   Final    BLOOD LEFT WRIST Performed at [facility] [address] [place], [place] [address]    Special Requests   Final    BOTTLES  DRAWN AEROBIC AND ANAEROBIC Blood Culture adequate volume Performed at [facility] [address] [place], [place] [address]    Culture   Final    NO GROWTH < 24 HOURS Performed at [facility] [address], [place], [place] [address]    Report Status PENDING  Incomplete    Radiology Reports Dg Chest Port 1 View  Result Date: 03/15/2019 CLINICAL DATA:  Shortness of breath EXAM: PORTABLE CHEST 1 VIEW COMPARISON:  June 18, 2016 FINDINGS: There are few subtle airspace opacities for example at the right lung base. There is no pneumothorax. No large pleural effusion. The heart size is stable from prior study. There is no acute osseous abnormality. IMPRESSION: Scattered airspace opacities which can be seen in patients with an atypical pneumonia. Electronically Signed   By: Constance Holster M.D.   On: 03/15/2019 15:12

## 2019-03-17 NOTE — Plan of Care (Signed)
Patient verbalized understanding of need to sleep in prone position. Patient states that he is feeling better and hopes to discharge home soon.

## 2019-03-18 LAB — COMPREHENSIVE METABOLIC PANEL
ALT: 49 U/L — ABNORMAL HIGH (ref 0–44)
AST: 35 U/L (ref 15–41)
Albumin: 3.5 g/dL (ref 3.5–5.0)
Alkaline Phosphatase: 63 U/L (ref 38–126)
Anion gap: 9 (ref 5–15)
BUN: 17 mg/dL (ref 6–20)
CO2: 27 mmol/L (ref 22–32)
Calcium: 8.5 mg/dL — ABNORMAL LOW (ref 8.9–10.3)
Chloride: 104 mmol/L (ref 98–111)
Creatinine, Ser: 0.75 mg/dL (ref 0.61–1.24)
GFR calc Af Amer: >60 mL/min (ref >60)
GFR calc non Af Amer: >60 mL/min (ref >60)
Glucose, Bld: 94 mg/dL (ref 70–99)
Potassium: 4.2 mmol/L (ref 3.5–5.1)
Sodium: 140 mmol/L (ref 135–145)
Total Bilirubin: 0.8 mg/dL (ref 0.3–1.2)
Total Protein: 6.8 g/dL (ref 6.5–8.1)

## 2019-03-18 LAB — CBC WITH DIFFERENTIAL/PLATELET
Abs Immature Granulocytes: 0.07 10*3/uL (ref 0.00–0.07)
Basophils Absolute: 0 10*3/uL (ref 0.0–0.1)
Basophils Relative: 0 %
Eosinophils Absolute: 0 10*3/uL (ref 0.0–0.5)
Eosinophils Relative: 0 %
HCT: 49.6 % (ref 39.0–52.0)
Hemoglobin: 16.2 g/dL (ref 13.0–17.0)
Immature Granulocytes: 1 %
Lymphocytes Relative: 16 %
Lymphs Abs: 1.6 10*3/uL (ref 0.7–4.0)
MCH: 28.7 pg (ref 26.0–34.0)
MCHC: 32.7 g/dL (ref 30.0–36.0)
MCV: 87.9 fL (ref 80.0–100.0)
Monocytes Absolute: 0.7 10*3/uL (ref 0.1–1.0)
Monocytes Relative: 7 %
Neutro Abs: 7.5 10*3/uL (ref 1.7–7.7)
Neutrophils Relative %: 76 %
Platelets: 228 10*3/uL (ref 150–400)
RBC: 5.64 MIL/uL (ref 4.22–5.81)
RDW: 13.9 % (ref 11.5–15.5)
WBC: 9.9 10*3/uL (ref 4.0–10.5)
nRBC: 0 % (ref 0.0–0.2)

## 2019-03-18 MED ORDER — SODIUM CHLORIDE 0.9 % IV SOLN
100.0000 mg | INTRAVENOUS | Status: AC
  Administered 2019-03-19: 100 mg via INTRAVENOUS
  Filled 2019-03-18: qty 20

## 2019-03-18 MED ORDER — AMLODIPINE BESYLATE 5 MG PO TABS
10.0000 mg | ORAL_TABLET | Freq: Every day | ORAL | Status: DC
  Administered 2019-03-18 – 2019-03-19 (×2): 10 mg via ORAL
  Filled 2019-03-18 (×2): qty 2

## 2019-03-18 NOTE — Progress Notes (Signed)
PROGRESS NOTE                                                                                                                                                                                                             Patient Demographics:    Jacob Foster, is a 53 y.o. male, DOB - 07-13-1966, VXB:[id number]  Outpatient Primary MD for the patient is Delorse Limber    LOS - 3  Admit date - 03/15/2019    Chief Complaint  Patient presents with  . COVID       Brief Narrative  - Jacob Foster is a 53 y.o. male prior history of hyperlipidemia, hydradenitis suppurativa, GERD, was brought in by EMS for shortness of breath left-sided chest pain associated with coughing, fevers and chills since 4 days.  Patient also reports some headaches denies any blurry vision or sensory deficits.  He reports loss of taste and smell.  He reports some nausea but no vomiting or diarrhea or abdominal pain.  He denies any dysuria, orthopnea or PND or leg edema.  Left-sided chest pain tender on palpation nonradiating and comes with coughing.  He also reported he had a fainting spell today after persistent coughing for about 10 minutes.  And he was tested on 03/09/2019 and was positive for COVID-19.   Subjective:   Patient in bed, appears comfortable, denies any headache, no fever, no chest pain or pressure, no shortness of breath , no abdominal pain. No focal weakness.   Assessment  & Plan :     1. Acute Hypoxic Resp. Failure due to Acute Covid 19 Viral Pneumonitis during the ongoing 2020 Covid 19 Pandemic - he had moderate disease and hypoxia, has been started on steroids and Remdisvir combination, inflammatory markers will be monitored, clinically his main subjective complaint is extreme cough which has improved with supportive care, now on RA.  He has been encouraged to sit up in chair use flutter valve and I-S for pulmonary toiletry in the morning  and then prone at night when in bed.  Will continue to monitor closely.  Is clinically much improved continue steroids and Remdisvir combination finish the course and then discharge home.   COVID-19 Labs  Recent Labs    03/15/19 1744 03/16/19 0335 03/17/19 0313  DDIMER 0.31 0.38 0.31  FERRITIN 284 260 234  LDH  --   --  196*  CRP 2.7* 3.8* 1.1*    Lab Results  Component Value Date   SARSCOV2NAA Detected (A) 03/09/2019     Hepatic Function Latest Ref Rng & Units 03/18/2019 03/17/2019 03/16/2019  Total Protein 6.5 - 8.1 g/dL 6.8 7.2 7.9  Albumin 3.5 - 5.0 g/dL 3.5 3.6 3.8  AST 15 - 41 U/L 35 31 46(H)  ALT 0 - 44 U/L 49(H) 40 50(H)  Alk Phosphatase 38 - 126 U/L 63 67 74  Total Bilirubin 0.3 - 1.2 mg/dL 0.8 0.7 0.8        Component Value Date/Time   BNP 16.4 03/17/2019 0313      2.  Underlying history of hidradenitis suppurativa - continue home regimen.  3.  Underlying history of GERD and dyslipidemia.  Not on any medications will monitor and follow with PCP.   4.  Dehydration.  Hydrateed with IV fluids .  5. HTN - start Norvasc.    Condition - Fair  Family Communication  : None  Code Status : Full  Diet :   Diet Order            Diet regular Room service appropriate? Yes; Fluid consistency: Thin  Diet effective now               Disposition Plan  :  Home once better  Consults  :  None  Procedures  :     PUD Prophylaxis :    DVT Prophylaxis  :  Lovenox   Lab Results  Component Value Date   PLT 228 03/18/2019    Inpatient Medications  Scheduled Meds: . chlorpheniramine-HYDROcodone  5 mL Oral Q12H  . enoxaparin (LOVENOX) injection  40 mg Subcutaneous Q24H  . feeding supplement (ENSURE ENLIVE)  237 mL Oral BID BM  . methylPREDNISolone (SOLU-MEDROL) injection  40 mg Intravenous Q12H   Continuous Infusions: . remdesivir 100 mg in NS 250 mL Stopped (03/17/19 2122)   PRN Meds:.acetaminophen, guaiFENesin-codeine  Antibiotics  :     Anti-infectives (From admission, onward)   Start     Dose/Rate Route Frequency Ordered Stop   03/16/19 2100  remdesivir 100 mg in sodium chloride 0.9 % 250 mL IVPB     100 mg 500 mL/hr over 30 Minutes Intravenous Every 24 hours 03/15/19 1956 03/20/19 2059   03/15/19 2100  remdesivir 200 mg in sodium chloride 0.9 % 250 mL IVPB     200 mg 500 mL/hr over 30 Minutes Intravenous Once 03/15/19 1956 03/15/19 2145       Time Spent in minutes  30   Lala Lund M.D on 03/18/2019 at 9:00 AM  To page go to [email or web address] - password [email or web address]  [facility] -  Office  [phone]   See all Orders from today for further details    Objective:   Vitals:   03/17/19 2000 03/17/19 2011 03/18/19 0400 03/18/19 0813  BP:  130/67 122/67 140/84  Pulse: 74  60 (!) 57  Resp: (!) 26  14 15   Temp:  98.1 F (36.7 C) 98 F (36.7 C)   TempSrc:  Oral Oral Oral  SpO2: 95% 95% 97% 95%  Weight:      Height:        Wt Readings from Last 3 Encounters:  03/15/19 83.9 kg  12/29/18 86.6 kg  12/28/18 86.8 kg     Intake/Output Summary (Last 24 hours) at 03/18/2019 0900 Last data filed at 03/18/2019 0814 Gross per 24  hour  Intake 1955 ml  Output 2625 ml  Net -670 ml     Physical Exam  Awake Alert, Oriented X 3, No new F.N deficits, Normal affect [place].AT,PERRAL Supple Neck,No JVD, No cervical lymphadenopathy appriciated.  Symmetrical Chest wall movement, Good air movement bilaterally, CTAB RRR,No Gallops, Rubs or new Murmurs, No Parasternal Heave +ve B.Sounds, Abd Soft, No tenderness, No organomegaly appriciated, No rebound - guarding or rigidity. No Cyanosis, Clubbing or edema, No new Rash or bruise    Data Review:    CBC Recent Labs  Lab 03/15/19 1412 03/16/19 0335 03/17/19 0313 03/18/19 0212  WBC 5.4 2.9* 8.0 9.9  HGB 17.8* 17.6* 16.9 16.2  HCT 54.5* 54.0* 52.0 49.6  PLT 167 161 199 228  MCV 88.5 88.8 87.4 87.9  MCH 28.9 28.9 28.4 28.7  MCHC 32.7 32.6 32.5 32.7  RDW 14.1 14.1  13.7 13.9  LYMPHSABS 1.5 0.7 1.2 1.6  MONOABS 0.6 0.1 0.5 0.7  EOSABS 0.0 0.0 0.0 0.0  BASOSABS 0.0 0.0 0.0 0.0    Chemistries  Recent Labs  Lab 03/15/19 1412 03/15/19 1744 03/16/19 0335 03/17/19 0313 03/18/19 0212  NA 134*  --  137 138 140  K 4.3  --  4.6 4.5 4.2  CL 98  --  101 102 104  CO2 25  --  26 26 27   GLUCOSE 103*  --  135* 96 94  BUN 15  --  17 18 17   CREATININE 1.11  --  0.99 0.88 0.75  CALCIUM 8.5*  --  8.7* 8.7* 8.5*  MG  --  2.5* 2.7* 2.4  --   AST  --   --  46* 31 35  ALT  --   --  50* 40 49*  ALKPHOS  --   --  74 67 63  BILITOT  --   --  0.8 0.7 0.8   ------------------------------------------------------------------------------------------------------------------ No results for input(s): CHOL, HDL, LDLCALC, TRIG, CHOLHDL, LDLDIRECT in the last 72 hours.  No results found for: HGBA1C ------------------------------------------------------------------------------------------------------------------ No results for input(s): TSH, T4TOTAL, T3FREE, THYROIDAB in the last 72 hours.  Invalid input(s): FREET3  Cardiac Enzymes No results for input(s): CKMB, TROPONINI, MYOGLOBIN in the last 168 hours.  Invalid input(s): CK ------------------------------------------------------------------------------------------------------------------    Component Value Date/Time   BNP 16.4 03/17/2019 0313    Micro Results Recent Results (from the past 240 hour(s))  Novel Coronavirus, NAA ([facility])     Status: Abnormal   Collection Time: 03/09/19 12:00 AM   Specimen: Oropharyngeal(OP) collection in vial transport medium   OROPHARYNGEA  TESTING  Result Value Ref Range Status   SARS-CoV-2, NAA Detected (A) Not Detected Final    Comment: This test was developed and its performance characteristics determined by [employer]. This test has not been FDA cleared or approved. This test has been authorized by FDA under an Emergency Use Authorization (EUA). This test is  only authorized for the duration of time the declaration that circumstances exist justifying the authorization of the emergency use of in vitro diagnostic tests for detection of SARS-CoV-2 virus and/or diagnosis of COVID-19 infection under section 564(b)(1) of the Act, 21 U.S.C. 801KPV-3(Z)(4), unless the authorization is terminated or revoked sooner. When diagnostic testing is negative, the possibility of a false negative result should be considered in the context of a patient's recent exposures and the presence of clinical signs and symptoms consistent with COVID-19. An individual without symptoms of COVID-19 and who is not shedding SARS-CoV-2 virus would expect to have  a negative (not detected) result in this assay.   Culture, blood (Routine X 2) w Reflex to ID Panel     Status: None (Preliminary result)   Collection Time: 03/15/19  5:30 PM   Specimen: BLOOD RIGHT HAND  Result Value Ref Range Status   Specimen Description   Final    BLOOD RIGHT HAND Performed at [facility] [address] [place], [place] [address]    Special Requests   Final    BOTTLES DRAWN AEROBIC AND ANAEROBIC Blood Culture adequate volume Performed at [facility] [address], [place], [place] [address]    Culture   Final    NO GROWTH 2 DAYS Performed at [facility] [address] [place], [place] [address]    Report Status PENDING  Incomplete  Culture, blood (Routine X 2) w Reflex to ID Panel     Status: None (Preliminary result)   Collection Time: 03/15/19  5:40 PM   Specimen: BLOOD  Result Value Ref Range Status   Specimen Description   Final    BLOOD LEFT WRIST Performed at [facility] [address], [place], [place] [address]    Special Requests   Final    BOTTLES DRAWN AEROBIC AND ANAEROBIC Blood Culture adequate volume Performed at [facility] [address], [place], [place] [address]    Culture    Final    NO GROWTH 2 DAYS Performed at [facility] [address] [place], [place] [address]    Report Status PENDING  Incomplete    Radiology Reports Dg Chest Port 1 View  Result Date: 03/15/2019 CLINICAL DATA:  Shortness of breath EXAM: PORTABLE CHEST 1 VIEW COMPARISON:  June 18, 2016 FINDINGS: There are few subtle airspace opacities for example at the right lung base. There is no pneumothorax. No large pleural effusion. The heart size is stable from prior study. There is no acute osseous abnormality. IMPRESSION: Scattered airspace opacities which can be seen in patients with an atypical pneumonia. Electronically Signed   By: Constance Holster M.D.   On: 03/15/2019 15:12

## 2019-03-18 NOTE — Plan of Care (Signed)
  Problem: Respiratory: Goal: Will maintain a patent airway Description: Goal wean off O2 Outcome: Progressing Goal: Complications related to the disease process, condition or treatment will be avoided or minimized Outcome: Progressing

## 2019-03-18 NOTE — Progress Notes (Signed)
Called pt's wife to update her on her husband's plan of care.  She appreciated the update.

## 2019-03-18 NOTE — Progress Notes (Signed)
Pt's wife updated on POC. Questions answered.

## 2019-03-19 DIAGNOSIS — I10 Essential (primary) hypertension: Secondary | ICD-10-CM

## 2019-03-19 LAB — COMPREHENSIVE METABOLIC PANEL
ALT: 71 U/L — ABNORMAL HIGH (ref 0–44)
AST: 39 U/L (ref 15–41)
Albumin: 3.6 g/dL (ref 3.5–5.0)
Alkaline Phosphatase: 67 U/L (ref 38–126)
Anion gap: 8 (ref 5–15)
BUN: 20 mg/dL (ref 6–20)
CO2: 28 mmol/L (ref 22–32)
Calcium: 8.9 mg/dL (ref 8.9–10.3)
Chloride: 103 mmol/L (ref 98–111)
Creatinine, Ser: 0.9 mg/dL (ref 0.61–1.24)
GFR calc Af Amer: >60 mL/min (ref >60)
GFR calc non Af Amer: >60 mL/min (ref >60)
Glucose, Bld: 83 mg/dL (ref 70–99)
Potassium: 4 mmol/L (ref 3.5–5.1)
Sodium: 139 mmol/L (ref 135–145)
Total Bilirubin: 0.7 mg/dL (ref 0.3–1.2)
Total Protein: 7.2 g/dL (ref 6.5–8.1)

## 2019-03-19 LAB — CBC WITH DIFFERENTIAL/PLATELET
Abs Immature Granulocytes: 0.34 10*3/uL — ABNORMAL HIGH (ref 0.00–0.07)
Basophils Absolute: 0 10*3/uL (ref 0.0–0.1)
Basophils Relative: 0 %
Eosinophils Absolute: 0 10*3/uL (ref 0.0–0.5)
Eosinophils Relative: 0 %
HCT: 51.6 % (ref 39.0–52.0)
Hemoglobin: 16.7 g/dL (ref 13.0–17.0)
Immature Granulocytes: 3 %
Lymphocytes Relative: 19 %
Lymphs Abs: 2 10*3/uL (ref 0.7–4.0)
MCH: 28.4 pg (ref 26.0–34.0)
MCHC: 32.4 g/dL (ref 30.0–36.0)
MCV: 87.6 fL (ref 80.0–100.0)
Monocytes Absolute: 0.8 10*3/uL (ref 0.1–1.0)
Monocytes Relative: 7 %
Neutro Abs: 7.3 10*3/uL (ref 1.7–7.7)
Neutrophils Relative %: 71 %
Platelets: 264 10*3/uL (ref 150–400)
RBC: 5.89 MIL/uL — ABNORMAL HIGH (ref 4.22–5.81)
RDW: 13.7 % (ref 11.5–15.5)
WBC: 10.5 10*3/uL (ref 4.0–10.5)
nRBC: 0 % (ref 0.0–0.2)

## 2019-03-19 MED ORDER — AMLODIPINE BESYLATE 10 MG PO TABS: 10.0000 mg | ORAL_TABLET | Freq: Every day | ORAL | 0 refills | Status: DC

## 2019-03-19 MED ORDER — METHYLPREDNISOLONE 4 MG PO TBPK: ORAL_TABLET | ORAL | 0 refills | Status: DC

## 2019-03-19 MED ORDER — ALBUTEROL SULFATE HFA 108 (90 BASE) MCG/ACT IN AERS: 2.0000 | INHALATION_SPRAY | Freq: Four times a day (QID) | RESPIRATORY_TRACT | 0 refills | Status: DC | PRN

## 2019-03-19 MED ORDER — CARVEDILOL 6.25 MG PO TABS: 6.2500 mg | ORAL_TABLET | Freq: Two times a day (BID) | ORAL | 0 refills | Status: DC

## 2019-03-19 MED ORDER — HYDRALAZINE HCL 50 MG PO TABS: 50.0000 mg | ORAL_TABLET | Freq: Three times a day (TID) | ORAL | 0 refills | Status: DC

## 2019-03-19 NOTE — Progress Notes (Signed)
TOC CM chart reviewed and no dc needs identified. [place], [facility] TOC CM [phone]

## 2019-03-19 NOTE — Discharge Instructions (Signed)
Follow with Primary MD Brunetta Jeans, PA-C in 7 days   Get CBC, CMP, 2 view Chest X ray -  checked next visit within 1 week by Primary MD    Activity: As tolerated with Full fall precautions use walker/cane & assistance as needed  Disposition Home    Diet: Heart Healthy    Special Instructions: If you have smoked or chewed Tobacco  in the last 2 yrs please stop smoking, stop any regular Alcohol  and or any Recreational drug use.  On your next visit with your primary care physician please Get Medicines reviewed and adjusted.  Please request your Prim.MD to go over all Hospital Tests and Procedure/Radiological results at the follow up, please get all Hospital records sent to your Prim MD by signing hospital release before you go home.  If you experience worsening of your admission symptoms, develop shortness of breath, life threatening emergency, suicidal or homicidal thoughts you must seek medical attention immediately by calling 911 or calling your MD immediately  if symptoms less severe.  You Must read complete instructions/literature along with all the possible adverse reactions/side effects for all the Medicines you take and that have been prescribed to you. Take any new Medicines after you have completely understood and accpet all the possible adverse reactions/side effects.        Person Under Monitoring Name: Jacob Foster  Location: [address] [place] [address]   Infection Prevention Recommendations for Individuals Confirmed to have, or Being Evaluated for, 2019 Novel Coronavirus (COVID-19) Infection Who Receive Care at Home  Individuals who are confirmed to have, or are being evaluated for, COVID-19 should follow the prevention steps below until a healthcare provider or local or state health department says they can return to normal activities.  Stay home except to get medical care You should restrict activities outside your home, except for getting  medical care. Do not go to work, school, or public areas, and do not use public transportation or taxis.  Call ahead before visiting your doctor Before your medical appointment, call the healthcare provider and tell them that you have, or are being evaluated for, COVID-19 infection. This will help the healthcare providers office take steps to keep other people from getting infected. Ask your healthcare provider to call the local or state health department.  Monitor your symptoms Seek prompt medical attention if your illness is worsening (e.g., difficulty breathing). Before going to your medical appointment, call the healthcare provider and tell them that you have, or are being evaluated for, COVID-19 infection. Ask your healthcare provider to call the local or state health department.  Wear a facemask You should wear a facemask that covers your nose and mouth when you are in the same room with other people and when you visit a healthcare provider. People who live with or visit you should also wear a facemask while they are in the same room with you.  Separate yourself from other people in your home As much as possible, you should stay in a different room from other people in your home. Also, you should use a separate bathroom, if available.  Avoid sharing household items You should not share dishes, drinking glasses, cups, eating utensils, towels, bedding, or other items with other people in your home. After using these items, you should wash them thoroughly with soap and water.  Cover your coughs and sneezes Cover your mouth and nose with a tissue when you cough or sneeze, or you can  cough or sneeze into your sleeve. Throw used tissues in a lined trash can, and immediately wash your hands with soap and water for at least 20 seconds or use an alcohol-based hand rub.  Wash your [facility] your hands often and thoroughly with soap and water for at least 20 seconds. You can use an  alcohol-based hand sanitizer if soap and water are not available and if your hands are not visibly dirty. Avoid touching your eyes, nose, and mouth with unwashed hands.   Prevention Steps for Caregivers and Household Members of Individuals Confirmed to have, or Being Evaluated for, COVID-19 Infection Being Cared for in the Home  If you live with, or provide care at home for, a person confirmed to have, or being evaluated for, COVID-19 infection please follow these guidelines to prevent infection:  Follow healthcare providers instructions Make sure that you understand and can help the patient follow any healthcare provider instructions for all care.  Provide for the patients basic needs You should help the patient with basic needs in the home and provide support for getting groceries, prescriptions, and other personal needs.  Monitor the patients symptoms If they are getting sicker, call his or her medical provider and tell them that the patient has, or is being evaluated for, COVID-19 infection. This will help the healthcare providers office take steps to keep other people from getting infected. Ask the healthcare provider to call the local or state health department.  Limit the number of people who have contact with the patient  If possible, have only one caregiver for the patient.  Other household members should stay in another home or place of residence. If this is not possible, they should stay  in another room, or be separated from the patient as much as possible. Use a separate bathroom, if available.  Restrict visitors who do not have an essential need to be in the home.  Keep older adults, very young children, and other sick people away from the patient Keep older adults, very young children, and those who have compromised immune systems or chronic health conditions away from the patient. This includes people with chronic heart, lung, or kidney conditions, diabetes, and  cancer.  Ensure good ventilation Make sure that shared spaces in the home have good air flow, such as from an air conditioner or an opened window, weather permitting.  Wash your hands often  Wash your hands often and thoroughly with soap and water for at least 20 seconds. You can use an alcohol based hand sanitizer if soap and water are not available and if your hands are not visibly dirty.  Avoid touching your eyes, nose, and mouth with unwashed hands.  Use disposable paper towels to dry your hands. If not available, use dedicated cloth towels and replace them when they become wet.  Wear a facemask and gloves  Wear a disposable facemask at all times in the room and gloves when you touch or have contact with the patients blood, body fluids, and/or secretions or excretions, such as sweat, saliva, sputum, nasal mucus, vomit, urine, or feces.  Ensure the mask fits over your nose and mouth tightly, and do not touch it during use.  Throw out disposable facemasks and gloves after using them. Do not reuse.  Wash your hands immediately after removing your facemask and gloves.  If your personal clothing becomes contaminated, carefully remove clothing and launder. Wash your hands after handling contaminated clothing.  Place all used disposable facemasks, gloves, and  other waste in a lined container before disposing them with other household waste.  Remove gloves and wash your hands immediately after handling these items.  Do not share dishes, glasses, or other household items with the patient  Avoid sharing household items. You should not share dishes, drinking glasses, cups, eating utensils, towels, bedding, or other items with a patient who is confirmed to have, or being evaluated for, COVID-19 infection.  After the person uses these items, you should wash them thoroughly with soap and water.  Wash laundry thoroughly  Immediately remove and wash clothes or bedding that have blood, body  fluids, and/or secretions or excretions, such as sweat, saliva, sputum, nasal mucus, vomit, urine, or feces, on them.  Wear gloves when handling laundry from the patient.  Read and follow directions on labels of laundry or clothing items and detergent. In general, wash and dry with the warmest temperatures recommended on the label.  Clean all areas the individual has used often  Clean all touchable surfaces, such as counters, tabletops, doorknobs, bathroom fixtures, toilets, phones, keyboards, tablets, and bedside tables, every day. Also, clean any surfaces that may have blood, body fluids, and/or secretions or excretions on them.  Wear gloves when cleaning surfaces the patient has come in contact with.  Use a diluted bleach solution (e.g., dilute bleach with 1 part bleach and 10 parts water) or a household disinfectant with a label that says EPA-registered for coronaviruses. To make a bleach solution at home, add 1 tablespoon of bleach to 1 quart (4 cups) of water. For a larger supply, add  cup of bleach to 1 gallon (16 cups) of water.  Read labels of cleaning products and follow recommendations provided on product labels. Labels contain instructions for safe and effective use of the cleaning product including precautions you should take when applying the product, such as wearing gloves or eye protection and making sure you have good ventilation during use of the product.  Remove gloves and wash hands immediately after cleaning.  Monitor yourself for signs and symptoms of illness Caregivers and household members are considered close contacts, should monitor their health, and will be asked to limit movement outside of the home to the extent possible. Follow the monitoring steps for close contacts listed on the symptom monitoring form.   ? If you have additional questions, contact your local health department or call the epidemiologist on call at [phone] (available 24/7). ? This  guidance is subject to change. For the most up-to-date guidance from [facility], please refer to their website: [email or web address]

## 2019-03-19 NOTE — Discharge Summary (Signed)
Jacob Foster [id number] DOB: 05-29-1966 DOA: 03/15/2019  PCP: Brunetta Jeans, PA-C  Admit date: 03/15/2019  Discharge date: 03/19/2019  Admitted From: Home   Disposition:  Home   Recommendations for Outpatient Follow-up:   Follow up with PCP in 1-2 weeks  PCP Please obtain BMP/CBC, 2 view CXR in 1week,  (see Discharge instructions)   PCP Please follow up on the following pending results:    Home Health: None   Equipment/Devices: None Consultations: None  Discharge Condition: Stable    CODE STATUS: Full    Diet Recommendation: Heart Healthy      Chief Complaint  Patient presents with  . COVID     Brief history of present illness from the day of admission and additional interim summary    Jacob Cottonis a 53 y.o.maleprior history of hyperlipidemia, hydradenitis suppurativa, GERD, was brought in by EMS for shortness of breath left-sided chest pain associated with coughing, fevers and chills since 4 days. Patient also reports some headaches denies any blurry vision or sensory deficits. He reports loss of taste and smell. He reports some nausea but no vomiting or diarrhea or abdominal pain. He denies any dysuria, orthopnea or PND or leg edema. Left-sided chest pain tender on palpation nonradiating and comes with coughing. He also reported he had a fainting spell today after persistent coughing for about 10 minutes. And he was tested on 03/09/2019 and was positive for COVID-19.                                                                 Hospital Course    1.   1. Acute Hypoxic Resp. Failure due to Acute Covid 19 Viral Pneumonitis during the ongoing 2020 Covid 19 Pandemic - he had moderate disease and hypoxia, has been started on steroids and Remdisvir combination, inflammatory markers will be  monitored, clinically his main subjective complaint is extreme cough which has improved with supportive care, now on RA.  He is now completely symptom-free.  Is clinically much improved continue steroids and Remdisvir combination finish the course today and then discharge home.  COVID-19 Labs  Recent Labs    03/17/19 0313  DDIMER 0.31  FERRITIN 234  LDH 196*  CRP 1.1*    Lab Results  Component Value Date   SARSCOV2NAA Detected (A) 03/09/2019     2.  Underlying history of hidradenitis suppurativa - continue home regimen.  3.  Underlying history of GERD and dyslipidemia.  Not on any medications will monitor and follow with PCP.   4.  Dehydration.    Has been adequately hydrated with IV fluids.  5. HTN -restart Norvasc and hydralazine combination with good control follow with PCP   Discharge diagnosis     Active Problems:   COVID-19 virus infection    Discharge  instructions    Discharge Instructions    Diet - low sodium heart healthy   Complete by: As directed    Discharge instructions   Complete by: As directed    Follow with Primary MD Brunetta Jeans, PA-C in 7 days   Get CBC, CMP, 2 view Chest X ray -  checked next visit within 1 week by Primary MD    Activity: As tolerated with Full fall precautions use walker/cane & assistance as needed  Disposition Home    Diet: Heart Healthy    Special Instructions: If you have smoked or chewed Tobacco  in the last 2 yrs please stop smoking, stop any regular Alcohol  and or any Recreational drug use.  On your next visit with your primary care physician please Get Medicines reviewed and adjusted.  Please request your Prim.MD to go over all Hospital Tests and Procedure/Radiological results at the follow up, please get all Hospital records sent to your Prim MD by signing hospital release before you go home.  If you experience worsening of your admission symptoms, develop shortness of breath, life threatening  emergency, suicidal or homicidal thoughts you must seek medical attention immediately by calling 911 or calling your MD immediately  if symptoms less severe.  You Must read complete instructions/literature along with all the possible adverse reactions/side effects for all the Medicines you take and that have been prescribed to you. Take any new Medicines after you have completely understood and accpet all the possible adverse reactions/side effects.   Increase activity slowly   Complete by: As directed       Discharge Medications   Allergies as of 03/19/2019   No Known Allergies     Medication List    TAKE these medications   albuterol 108 (90 Base) MCG/ACT inhaler Commonly known as: VENTOLIN HFA Inhale 2 puffs into the lungs every 6 (six) hours as needed for wheezing or shortness of breath.   amLODipine 10 MG tablet Commonly known as: NORVASC Take 1 tablet (10 mg total) by mouth daily. Start taking on: March 20, 2019   chlorpheniramine-HYDROcodone 10-8 MG/5ML Suer Commonly known as: Tussionex Pennkinetic ER Take 5 mLs by mouth every 12 (twelve) hours as needed. What changed: reasons to take this   hydrALAZINE 50 MG tablet Commonly known as: APRESOLINE Take 1 tablet (50 mg total) by mouth 3 (three) times daily.   methylPREDNISolone 4 MG Tbpk tablet Commonly known as: MEDROL DOSEPAK follow package directions       Follow-up Information    Brunetta Jeans, PA-C. Schedule an appointment as soon as possible for a visit in 1 week(s).   Specialty: Family Medicine Contact information: [address] [place] [address] [phone]           Major procedures and Radiology Reports - PLEASE review detailed and final reports thoroughly  -        Dg Chest Port 1 View  Result Date: 03/15/2019 CLINICAL DATA:  Shortness of breath EXAM: PORTABLE CHEST 1 VIEW COMPARISON:  June 18, 2016 FINDINGS: There are few subtle airspace opacities for example at the right  lung base. There is no pneumothorax. No large pleural effusion. The heart size is stable from prior study. There is no acute osseous abnormality. IMPRESSION: Scattered airspace opacities which can be seen in patients with an atypical pneumonia. Electronically Signed   By: Constance Holster M.D.   On: 03/15/2019 15:12    Micro Results  Recent Results (from the past 240 hour(s))  Culture, blood (Routine X 2) w Reflex to ID Panel     Status: None (Preliminary result)   Collection Time: 03/15/19  5:30 PM   Specimen: BLOOD RIGHT HAND  Result Value Ref Range Status   Specimen Description   Final    BLOOD RIGHT HAND Performed at [facility], [place] [address], [place], [place] [address]    Special Requests   Final    BOTTLES DRAWN AEROBIC AND ANAEROBIC Blood Culture adequate volume Performed at [facility] [address], [place], [place] [address]    Culture   Final    NO GROWTH 3 DAYS Performed at [facility], [place] [address] [place], [place] [address]    Report Status PENDING  Incomplete  Culture, blood (Routine X 2) w Reflex to ID Panel     Status: None (Preliminary result)   Collection Time: 03/15/19  5:40 PM   Specimen: BLOOD  Result Value Ref Range Status   Specimen Description   Final    BLOOD LEFT WRIST Performed at [facility] [address] [place], [place] [address]    Special Requests   Final    BOTTLES DRAWN AEROBIC AND ANAEROBIC Blood Culture adequate volume Performed at [facility] [address] [place], [place] [address]    Culture   Final    NO GROWTH 3 DAYS Performed at [facility], [place] [address], [place], [place] [address]    Report Status PENDING  Incomplete    Today   Subjective    Labyron Nohl today has no headache, no chest abdominal pain,no new weakness tingling or numbness, feels much better wants to go home today.     Objective   Blood pressure  133/89, pulse (!) 54, temperature 98 F (36.7 C), temperature source Oral, resp. rate 15, height 5\' 8"  (1.727 m), weight 83.9 kg, SpO2 97 %.   Intake/Output Summary (Last 24 hours) at 03/19/2019 0946 Last data filed at 03/19/2019 0400 Gross per 24 hour  Intake -  Output 1825 ml  Net -1825 ml    Exam Awake Alert, Oriented x 3, No new F.N deficits, Normal affect .AT,PERRAL Supple Neck,No JVD, No cervical lymphadenopathy appriciated.  Symmetrical Chest wall movement, Good air movement bilaterally, CTAB RRR,No Gallops,Rubs or new Murmurs, No Parasternal Heave +ve B.Sounds, Abd Soft, Non tender, No organomegaly appriciated, No rebound -guarding or rigidity. No Cyanosis, Clubbing or edema, No new Rash or bruise   Data Review   CBC w Diff:  Lab Results  Component Value Date   WBC 10.5 03/19/2019   HGB 16.7 03/19/2019   HCT 51.6 03/19/2019   PLT 264 03/19/2019   LYMPHOPCT 19 03/19/2019   MONOPCT 7 03/19/2019   EOSPCT 0 03/19/2019   BASOPCT 0 03/19/2019    CMP:  Lab Results  Component Value Date   NA 139 03/19/2019   K 4.0 03/19/2019   CL 103 03/19/2019   CO2 28 03/19/2019   BUN 20 03/19/2019   CREATININE 0.90 03/19/2019   CREATININE 1.10 11/27/2017   PROT 7.2 03/19/2019   ALBUMIN 3.6 03/19/2019   BILITOT 0.7 03/19/2019   ALKPHOS 67 03/19/2019   AST 39 03/19/2019   ALT 71 (H) 03/19/2019  .   Total Time in preparing paper work, data evaluation and todays exam - 66 minutes  Lala Lund M.D on 03/19/2019 at 9:46 AM  [facility]  Office  [phone]

## 2019-03-19 NOTE — Progress Notes (Signed)
Discharge instructions completed with pt.  Pt verbalized understanding of the information.  Pt denies chest pain, shortness of breath, dizziness, lightheadedness, and n/v.  Pt's IV discontinued.  Pt waiting on ride home.

## 2019-03-20 LAB — CULTURE, BLOOD (ROUTINE X 2)
Culture: NO GROWTH
Culture: NO GROWTH
Special Requests: ADEQUATE
Special Requests: ADEQUATE

## 2019-03-22 ENCOUNTER — Telehealth: Payer: Self-pay

## 2019-03-22 NOTE — Telephone Encounter (Signed)
LM requesting call back to complete TCM and schedule hospital f/u,

## 2019-03-23 ENCOUNTER — Encounter: Payer: Self-pay | Admitting: Physician Assistant

## 2019-03-23 ENCOUNTER — Ambulatory Visit (INDEPENDENT_AMBULATORY_CARE_PROVIDER_SITE_OTHER): Payer: Commercial Managed Care - PPO | Admitting: Physician Assistant

## 2019-03-23 VITALS — BP 132/80 | HR 76

## 2019-03-23 DIAGNOSIS — U071 COVID-19: Secondary | ICD-10-CM

## 2019-03-23 DIAGNOSIS — J069 Acute upper respiratory infection, unspecified: Secondary | ICD-10-CM

## 2019-03-23 NOTE — Progress Notes (Signed)
Virtual Visit via Video   I connected with patient on 03/23/19 at 11:00 AM EDT by a video enabled telemedicine application and verified that I am speaking with the correct person using two identifiers.  Location patient: Home Location provider: [facility], Office Persons participating in the virtual visit: Patient, Provider, [place] (Patina Moore)  I discussed the limitations of evaluation and management by telemedicine and the availability of in person appointments. The patient expressed understanding and agreed to proceed.  Subjective:   HPI:   Patient presents via [employer] today for hospital follow-up. Patient presented to ER on 03/15/2019 with worsening SOB, LH and dizziness. Was diagnosed with COVID earlier in the week and had been managing at home. Patient noted to have mild hypoxia in ER. CXR obtained revealing viral pneumonia. Was started on IV steroids and antiviral and admitted for further management and monitoring. Patient responded very well to combination of medication, stabilized and was felt stable to discharge home on 03/19/2019 with PCP follow-up. Of note patient remained hypertensive during admission and had amlodipine and hydralazine added to his regimen of Carvedilol.  Since discharge patient endorses doing well overall. Is finishing his Medrol pack. Denies fever, chills, chest congestion or SOB. Occasional tenderness in chest wall with deep breathing or cough. Improving daily. Is hydrating and eating a well-balanced diet. Notes taking BP medications as directed and home BP measurements have been normotensive. Has had 2 episodes of lightheadedness since coming home, both of which were when jumping up out of bed after lying for prolonged periods of time. Only lasted a couple of seconds. Otherwise has been doing well. ROS:   See pertinent positives and negatives per HPI.  Patient Active Problem List   Diagnosis Date Noted  . COVID-19 virus infection 03/15/2019  . Family  history of colon cancer 12/08/2018  . Serrated polyp of colon 12/08/2018  . Gastroesophageal reflux disease without esophagitis 08/20/2018  . GAD (generalized anxiety disorder) 04/23/2018  . Seasonal affective disorder ([place]) 04/23/2018  . Overweight (BMI 25.0-29.9) 09/05/2017    Social History   Tobacco Use  . Smoking status: Never Smoker  . Smokeless tobacco: Never Used  Substance Use Topics  . Alcohol use: No    Current Outpatient Medications:  .  albuterol (VENTOLIN HFA) 108 (90 Base) MCG/ACT inhaler, Inhale 2 puffs into the lungs every 6 (six) hours as needed for wheezing or shortness of breath., Disp: 6.7 g, Rfl: 0 .  amLODipine (NORVASC) 10 MG tablet, Take 1 tablet (10 mg total) by mouth daily., Disp: 30 tablet, Rfl: 0 .  carvedilol (COREG) 6.25 MG tablet, Take 1 tablet by mouth 2 (two) times daily., Disp: , Rfl:  .  chlorpheniramine-HYDROcodone (TUSSIONEX PENNKINETIC ER) 10-8 MG/5ML SUER, Take 5 mLs by mouth every 12 (twelve) hours as needed. (Patient taking differently: Take 5 mLs by mouth every 12 (twelve) hours as needed for cough. ), Disp: 140 mL, Rfl: 0 .  hydrALAZINE (APRESOLINE) 50 MG tablet, Take 1 tablet (50 mg total) by mouth 3 (three) times daily., Disp: 90 tablet, Rfl: 0 .  methylPREDNISolone (MEDROL DOSEPAK) 4 MG TBPK tablet, follow package directions, Disp: 21 tablet, Rfl: 0  No Known Allergies  Objective:   BP 132/80 Comment: Wrist cuff  Pulse 76   Patient is well-developed, well-nourished in no acute distress.  Resting comfortably at home.  Head is normocephalic, atraumatic.  No labored breathing.  Speech is clear and coherent with logical content.  Patient is alert and oriented at baseline.  Assessment and Plan:   1. Acute respiratory disease due to COVID-19 virus Clinically resolving. Stable vitals. Hydrating and eating well. He is to finish Medrol pack. Supportive measures and OTC medications reviewed. Will have him come in Monday wearing PPE for  repeat labs (would be past 10-day since symptom onset) and repeat CXR. Strict ER return precautions reviewed with patient and wife. - CBC w/Diff - Comp Met (CMET) - DG Chest 2 View; Future    Leeanne Rio, PA-C 03/23/2019

## 2019-03-23 NOTE — Progress Notes (Signed)
I have discussed the procedure for the virtual visit with the patient who has given consent to proceed with assessment and treatment.   Jacob Foster, CMA     

## 2019-03-23 NOTE — Patient Instructions (Signed)
Instructions sent to MyChart.   Keep hydrated and get plenty of rest. Continue medications as directed. Make sure to rise slowly after you have been laying down for long periods of time to prevent any positional lightheadedness. If not improving, let us know and we may need to change BP medications.  Make sure to finish entire course of steroids.  We will see you Monday for your lab appt.  Bethany or Danae Chen will be calling you to schedule your x-ray.   Hang in there!

## 2019-03-28 ENCOUNTER — Other Ambulatory Visit: Payer: Commercial Managed Care - PPO

## 2019-03-28 ENCOUNTER — Other Ambulatory Visit: Payer: Self-pay

## 2019-03-28 ENCOUNTER — Ambulatory Visit (INDEPENDENT_AMBULATORY_CARE_PROVIDER_SITE_OTHER): Payer: Commercial Managed Care - PPO

## 2019-03-28 DIAGNOSIS — J069 Acute upper respiratory infection, unspecified: Secondary | ICD-10-CM

## 2019-03-28 DIAGNOSIS — U071 COVID-19: Secondary | ICD-10-CM

## 2019-03-28 DIAGNOSIS — Z23 Encounter for immunization: Secondary | ICD-10-CM

## 2019-03-28 LAB — COMPREHENSIVE METABOLIC PANEL
ALT: 26 U/L (ref 0–53)
AST: 14 U/L (ref 0–37)
Albumin: 3.8 g/dL (ref 3.5–5.2)
Alkaline Phosphatase: 63 U/L (ref 39–117)
BUN: 17 mg/dL (ref 6–23)
CO2: 27 mEq/L (ref 19–32)
Calcium: 9.2 mg/dL (ref 8.4–10.5)
Chloride: 103 mEq/L (ref 96–112)
Creatinine, Ser: 0.91 mg/dL (ref 0.40–1.50)
GFR: 106.28 mL/min (ref >60.00)
Glucose, Bld: 86 mg/dL (ref 70–99)
Potassium: 4.2 mEq/L (ref 3.5–5.1)
Sodium: 136 mEq/L (ref 135–145)
Total Bilirubin: 0.6 mg/dL (ref 0.2–1.2)
Total Protein: 6.2 g/dL (ref 6.0–8.3)

## 2019-03-28 LAB — CBC WITH DIFFERENTIAL/PLATELET
Basophils Absolute: 0.1 10*3/uL (ref 0.0–0.1)
Basophils Relative: 1 % (ref 0.0–3.0)
Eosinophils Absolute: 0.1 10*3/uL (ref 0.0–0.7)
Eosinophils Relative: 1.4 % (ref 0.0–5.0)
HCT: 48.9 % (ref 39.0–52.0)
Hemoglobin: 16.3 g/dL (ref 13.0–17.0)
Lymphocytes Relative: 37.5 % (ref 12.0–46.0)
Lymphs Abs: 2.6 10*3/uL (ref 0.7–4.0)
MCHC: 33.3 g/dL (ref 30.0–36.0)
MCV: 87.7 fl (ref 78.0–100.0)
Monocytes Absolute: 0.9 10*3/uL (ref 0.1–1.0)
Monocytes Relative: 12.3 % — ABNORMAL HIGH (ref 3.0–12.0)
Neutro Abs: 3.3 10*3/uL (ref 1.4–7.7)
Neutrophils Relative %: 47.8 % (ref 43.0–77.0)
Platelets: 289 10*3/uL (ref 150.0–400.0)
RBC: 5.57 Mil/uL (ref 4.22–5.81)
RDW: 15 % (ref 11.5–15.5)
WBC: 6.9 10*3/uL (ref 4.0–10.5)

## 2019-03-30 ENCOUNTER — Other Ambulatory Visit: Payer: Self-pay

## 2019-03-30 MED ORDER — HYDROCORT-PRAMOXINE (PERIANAL) 2.5-1 % EX CREA: 1.0000 "application " | TOPICAL_CREAM | Freq: Three times a day (TID) | CUTANEOUS | 0 refills | Status: DC

## 2019-03-31 ENCOUNTER — Other Ambulatory Visit: Payer: Self-pay | Admitting: General Practice

## 2019-03-31 ENCOUNTER — Other Ambulatory Visit: Payer: Self-pay

## 2019-03-31 ENCOUNTER — Telehealth: Payer: Self-pay | Admitting: Physician Assistant

## 2019-03-31 MED ORDER — HYDROCORTISONE 2.5 % EX CREA: TOPICAL_CREAM | Freq: Two times a day (BID) | CUTANEOUS | 0 refills | Status: DC

## 2019-03-31 MED ORDER — HYDROCORTISONE 2.5 % EX SOLN: CUTANEOUS | 0 refills | Status: DC

## 2019-03-31 NOTE — Telephone Encounter (Signed)
[facility] doesn't have the texacort 2.5 solution in stock he wanted it to go to [facility] they will have to order the medication as well. Can we change this to the Cream instead of the solution.

## 2019-03-31 NOTE — Telephone Encounter (Signed)
Hydrocortisone 2.5% cream script has been sent to [facility].

## 2019-04-04 ENCOUNTER — Other Ambulatory Visit: Payer: Self-pay | Admitting: Emergency Medicine

## 2019-04-04 DIAGNOSIS — R3911 Hesitancy of micturition: Secondary | ICD-10-CM

## 2019-04-04 DIAGNOSIS — R399 Unspecified symptoms and signs involving the genitourinary system: Secondary | ICD-10-CM

## 2019-04-13 ENCOUNTER — Telehealth: Payer: Self-pay

## 2019-04-13 ENCOUNTER — Encounter: Payer: Self-pay | Admitting: Physician Assistant

## 2019-04-13 NOTE — Telephone Encounter (Signed)
Letter printed, scanned, and emailed per wife's request

## 2019-04-13 NOTE — Telephone Encounter (Signed)
Letter written and sent to MyChart. If he has somewhere he would like [place] to fax to I can print, sign and fax.

## 2019-04-13 NOTE — Telephone Encounter (Signed)
Patient's wife called in stating that he needs a letter stating that he is able to return to work tomorrow. Please advise

## 2019-06-17 ENCOUNTER — Ambulatory Visit: Payer: Commercial Managed Care - PPO | Admitting: Family Medicine

## 2019-08-29 ENCOUNTER — Encounter: Payer: Self-pay | Admitting: Physician Assistant

## 2019-08-29 ENCOUNTER — Ambulatory Visit (INDEPENDENT_AMBULATORY_CARE_PROVIDER_SITE_OTHER): Payer: Commercial Managed Care - PPO | Admitting: Physician Assistant

## 2019-08-29 VITALS — BP 138/96

## 2019-08-29 DIAGNOSIS — M7711 Lateral epicondylitis, right elbow: Secondary | ICD-10-CM | POA: Diagnosis not present

## 2019-08-29 DIAGNOSIS — M7710 Lateral epicondylitis, unspecified elbow: Secondary | ICD-10-CM

## 2019-08-29 MED ORDER — METHYLPREDNISOLONE 4 MG PO TBPK: ORAL_TABLET | ORAL | 0 refills | Status: DC

## 2019-08-29 NOTE — Patient Instructions (Signed)
Instructions sent to MyChart.  Avoid heavy lifting or overexertion. Take the steroid pack as directed. Tylenol for breakthrough pain. You can apply topical Voltaren or IcyHot to the area of concern. Get a tennis elbow brace at the pharmacy and wear daily.  And taking you out of work for the rest of the week so things can heal, especially given your history of this issue.  If symptoms not improving, anything worsens or new symptoms develop, please contact me ASAP.

## 2019-08-29 NOTE — Progress Notes (Signed)
Virtual Visit via Video   I connected with patient on 08/29/19 at 11:00 AM EST by a video enabled telemedicine application and verified that I am speaking with the correct person using two identifiers.  Location patient: Home Location provider: [facility], Office Persons participating in the virtual visit: Patient, Provider, [place] Doran Clay)  I discussed the limitations of evaluation and management by telemedicine and the availability of in person appointments. The patient expressed understanding and agreed to proceed.  Subjective:   HPI:   Patient presents via [facility] today c/o a few days of R lateral elbow pain with radiation into the forearm. 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 Notes occasional aching pain in the area since that time, especially with cold weather. Few days ago trying to lift himself up onto a roof and aggravated the area. When gripping an object the pain travels to forearm. Notes some numbness/tingling index finger and thumb. Denies numbness or tingling elsewhere. Denies neck or back pain with this. Denies redness, bruising or swelling. Pain 8/10 at its worst. Has not taken anything for this.   ROS:   See pertinent positives and negatives per HPI.  Patient Active Problem List   Diagnosis Date Noted  . COVID-19 virus infection 03/15/2019  . Family history of colon cancer 12/08/2018  . Serrated polyp of colon 12/08/2018  . Gastroesophageal reflux disease without esophagitis 08/20/2018  . GAD (generalized anxiety disorder) 04/23/2018  . Seasonal affective disorder ([place]) 04/23/2018  . Overweight (BMI 25.0-29.9) 09/05/2017    Social History   Tobacco Use  . Smoking status: Never Smoker  . Smokeless tobacco: Never Used  Substance Use Topics  . Alcohol use: No    Current Outpatient Medications:  .  albuterol (VENTOLIN HFA) 108 (90 Base) MCG/ACT inhaler, Inhale  2 puffs into the lungs every 6 (six) hours as needed for wheezing or shortness of breath., Disp: 6.7 g, Rfl: 0 .  amLODipine (NORVASC) 10 MG tablet, Take 1 tablet (10 mg total) by mouth daily., Disp: 30 tablet, Rfl: 0 .  carvedilol (COREG) 6.25 MG tablet, Take 1 tablet by mouth 2 (two) times daily., Disp: , Rfl:  .  chlorpheniramine-HYDROcodone (TUSSIONEX PENNKINETIC ER) 10-8 MG/5ML SUER, Take 5 mLs by mouth every 12 (twelve) hours as needed. (Patient not taking: Reported on 08/29/2019), Disp: 140 mL, Rfl: 0 .  hydrALAZINE (APRESOLINE) 50 MG tablet, Take 1 tablet (50 mg total) by mouth 3 (three) times daily., Disp: 90 tablet, Rfl: 0 .  hydrocortisone 2.5 % cream, Apply topically 2 (two) times daily. (Patient not taking: Reported on 08/29/2019), Disp: 30 g, Rfl: 0 .  methylPREDNISolone (MEDROL DOSEPAK) 4 MG TBPK tablet, follow package directions (Patient not taking: Reported on 08/29/2019), Disp: 21 tablet, Rfl: 0  No Known Allergies  Objective:   There were no vitals taken for this visit.  Patient is well-developed, well-nourished in no acute distress.  Resting comfortably at home.  Head is normocephalic, atraumatic.  No labored breathing.  Speech is clear and coherent with logical content.  Patient is alert and oriented at baseline.   Assessment and Plan:   1. Lateral epicondylitis of right elbow Patient notes point tenderness at lateral epicondyle as demonstrated on video exam. No alarm signs/symptoms noted. Rx Medrol dose pack. Tylenol for breakthrough pain. Counter force brace recommended. Topical medications reviewed. Follow-up in office if not resolving.  - methylPREDNISolone (MEDROL DOSEPAK) 4 MG TBPK tablet; Take  following package directions  Dispense: 21 tablet; Refill: 0    Leeanne Rio, [place] 08/29/2019

## 2019-09-02 ENCOUNTER — Telehealth: Payer: Self-pay | Admitting: Physician Assistant

## 2019-09-02 NOTE — Telephone Encounter (Signed)
Pt's wife called in asking for a work note to be faxed to [employer] to [phone]. She states that it is to say that he can return to work on 09/12/2019 and provided any restrictions/ limitations he may have.

## 2019-09-02 NOTE — Telephone Encounter (Signed)
Note written, printed, signed and faxed.

## 2019-09-02 NOTE — Telephone Encounter (Signed)
Please advise of extending time off from work

## 2019-09-02 NOTE — Telephone Encounter (Signed)
Being handled via [place].

## 2019-09-02 NOTE — Telephone Encounter (Signed)
Pt's wife called in stating that pt's elbow pain is not improving, she wanted to know what else the pt can do over the weekend. She wanted to know if pt could come in for an in office visit next week. She also has some forms that need to be completed for pt's employer due to him being out all week.

## 2019-09-07 ENCOUNTER — Other Ambulatory Visit: Payer: Self-pay

## 2019-09-07 ENCOUNTER — Ambulatory Visit: Payer: Commercial Managed Care - PPO | Admitting: Family Medicine

## 2019-09-07 ENCOUNTER — Encounter: Payer: Self-pay | Admitting: Family Medicine

## 2019-09-07 ENCOUNTER — Ambulatory Visit: Payer: Self-pay

## 2019-09-07 VITALS — BP 130/88 | HR 69 | Ht 68.0 in | Wt 182.4 lb

## 2019-09-07 DIAGNOSIS — M25521 Pain in right elbow: Secondary | ICD-10-CM

## 2019-09-07 DIAGNOSIS — M7711 Lateral epicondylitis, right elbow: Secondary | ICD-10-CM

## 2019-09-07 MED ORDER — NITROGLYCERIN 0.2 MG/HR TD PT24: MEDICATED_PATCH | TRANSDERMAL | 1 refills | Status: DC

## 2019-09-07 NOTE — Progress Notes (Signed)
Subjective:    I'm seeing this patient as a consultation for:  Jacob Noble, PA-C. Note will be routed back to referring provider/PCP.  CC: R lateral elbow pain  I, Molly Weber, LAT, ATC, am serving as scribe for Dr. Lynne Leader.  HPI: Pt is a 54 y/o male c/o R lateral elbow pain x several years that worsened approximately 2 weeks ago when he tried to lift/pull himself onto a roof.  He states that he prior surgery for R tennis elbow in 2018.  He locates his pain to his R lateral elbow, R medial forearm and R index, middle finger and thumb pain.  He rates his pain at a 5/10 and describes his pain as aching and sharp .  He works as an Electronics engineer.  He notes he also has some contralateral left elbow pain however this is being evaluated by [employer].   Radiating pain: Yes into R forearm Swelling: No Weakness: Numbness/tingling: Yes into R thumb - middle finger Neck pain: No Aggravating factors: Gripping activities; lifting something that requires a strong grip Treatments tried: Steroid dose pack; Voltaren gel; tennis elbow brace  Diagnostic imaging: C-spine XR- 04/16/18   Past medical history, Surgical history, Family history, Social history, Allergies, and medications have been entered into the medical record, reviewed.  Hx rt elbow surgery about 2.5 years ago [employer]  Review of Systems: No new headache, visual changes, nausea, vomiting, diarrhea, constipation, dizziness, abdominal pain, skin rash, fevers, chills, night sweats, weight loss, swollen lymph nodes, body aches, joint swelling, muscle aches, chest pain, shortness of breath, mood changes, visual or auditory hallucinations.   Objective:    Vitals:   09/07/19 0840  BP: 130/88  Pulse: 69  SpO2: 97%   General: Well Developed, well nourished, and in no acute distress.  Neuro/Psych: Alert and oriented x3, extra-ocular muscles intact, able to move all 4 extremities, sensation grossly intact. Skin: Warm  and dry, no rashes noted.  Respiratory: Not using accessory muscles, speaking in full sentences, trachea midline.  Cardiovascular: Pulses palpable, no extremity edema. Abdomen: Does not appear distended. MSK:  Right elbow mature appearing scar overlying lateral epicondyle normal-appearing otherwise. Normal range of motion. Normal strength to flexion extension pronation supination. Tender palpation lateral epicondyle.  Nontender otherwise. Pain with resisted wrist and finger extension.  Left elbow: Normal-appearing Normal motion. Tender palpation lateral epicondyle. Normal elbow strength. Pain with resisted wrist and finger extension.  Pulses cap refill and sensation are intact distally.  Lab and Radiology Results Diagnostic Limited MSK Ultrasound of: Right elbow: Lateral epicondyle visualized.  Tiny avulsion fleck at tip of lateral elbow near insertion of common extensor tendon. No tendon defects visible. Radial head normal. Impression: Lateral epicondylitis   Impression and Recommendations:    Assessment and Plan: 54 y.o. male with chronic right lateral epicondylitis failing typical management including trial of conservative management and even surgery a few years ago. The main factor for failure to improve I think this is demanding job.  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  We discussed treatment plan.  Plan to emphasize home exercise program including eccentric exercises.  Additionally will refer to occupational therapy.  He likely will benefit from custom splinting to stabilize his wrist fitted specifically to work with his work Designer, fashion/clothing and work duties.  He notes he had trouble with over-the-counter braces in the past before. Additionally  will start nitroglycerin patch protocol. Out of work for 2 weeks. Recheck prior to return to work in 2 weeks..  We will request records from  [facility]  PDMP not reviewed this encounter. Orders Placed This Encounter  Procedures  . [place] LIMITED JOINT SPACE STRUCTURES UP RIGHT(NO LINKED CHARGES)    Order Specific Question:   Reason for Exam (SYMPTOM  OR DIAGNOSIS REQUIRED)    Answer:   R elbow pain    Order Specific Question:   Preferred imaging location?    Answer:   [place]  . Ambulatory referral to Occupational Therapy    Referral Priority:   Routine    Referral Type:   Occupational Therapy    Referral Reason:   Specialty Services Required    Requested Specialty:   Occupational Therapy    Number of Visits Requested:   1   Meds ordered this encounter  Medications  . nitroGLYCERIN (NITRODUR - DOSED IN MG/24 HR) 0.2 mg/hr patch    Sig: Apply 1/4 patch daily to tendon for tendonitis.    Dispense:  30 patch    Refill:  1    Discussed warning signs or symptoms. Please see discharge instructions. Patient expresses understanding.   The above documentation has been reviewed and is accurate and complete Lynne Leader

## 2019-09-07 NOTE — Patient Instructions (Addendum)
Thank you for coming in today. I will request records from [place].  Do the exercises Molly reviewed.  You should hear from Occupational Therapy for splint   Try the nitroglycerine patches.  Nitroglycerin Protocol   Apply 1/4 nitroglycerin patch to affected area daily.  Change position of patch within the affected area every 24 hours.  You may experience a headache during the first 1-2 weeks of using the patch, these should subside.  If you experience headaches after beginning nitroglycerin patch treatment, you may take your preferred over the counter pain reliever.  Another side effect of the nitroglycerin patch is skin irritation or rash related to patch adhesive.  Please notify our office if you develop more severe headaches or rash, and stop the patch.  Tendon healing with nitroglycerin patch may require 12 to 24 weeks depending on the extent of injury.  Men should not use if taking Viagra, Cialis, or Levitra.   Do not use if you have migraines or rosacea.    Recheck with me before you return to work in 2 weeks.

## 2019-09-16 ENCOUNTER — Encounter: Payer: Self-pay | Admitting: Family Medicine

## 2019-09-16 NOTE — Progress Notes (Signed)
Received medical documentation from [facility].  Patient had right lateral epicondylitis tendon on to me with debridement and tendon reattachment by Dr. Caralyn Guile on October 07, 2017.  He had nerve conduction study on June 14, 2018 which was normal.  No evidence of neuropathy.  Radial tunnel syndrome could not be ruled out but no abnormality was noted at radial nerve distribution.

## 2019-09-18 IMAGING — DX DG CHEST 2V
2 series · 2 of 2 positions shown · non-contrast
Comparison: March 15, 2019

CLINICAL DATA: [id number] positive.  Acute respiratory disease

EXAM:
CHEST - 2 VIEW

[chest pa]
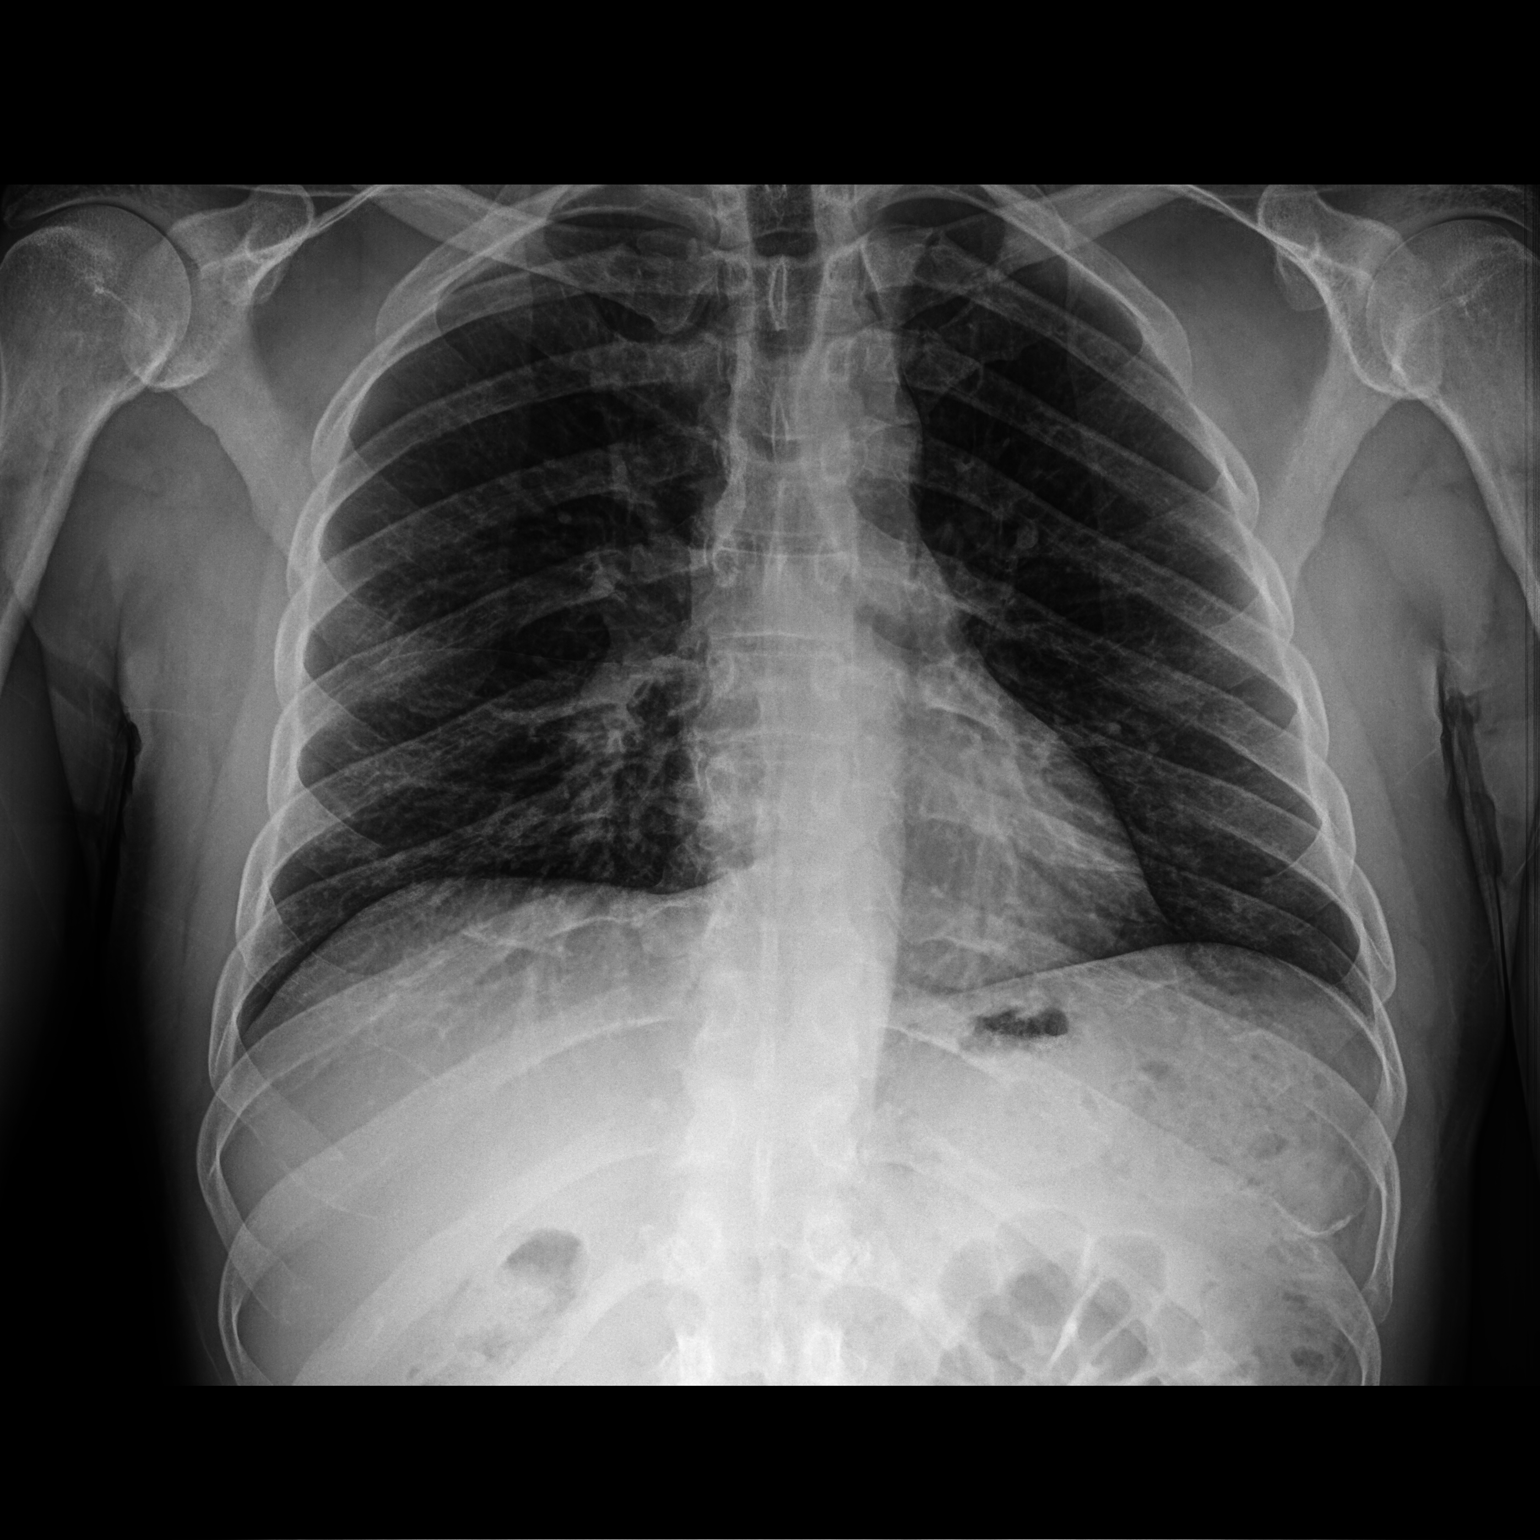

[chest lat]
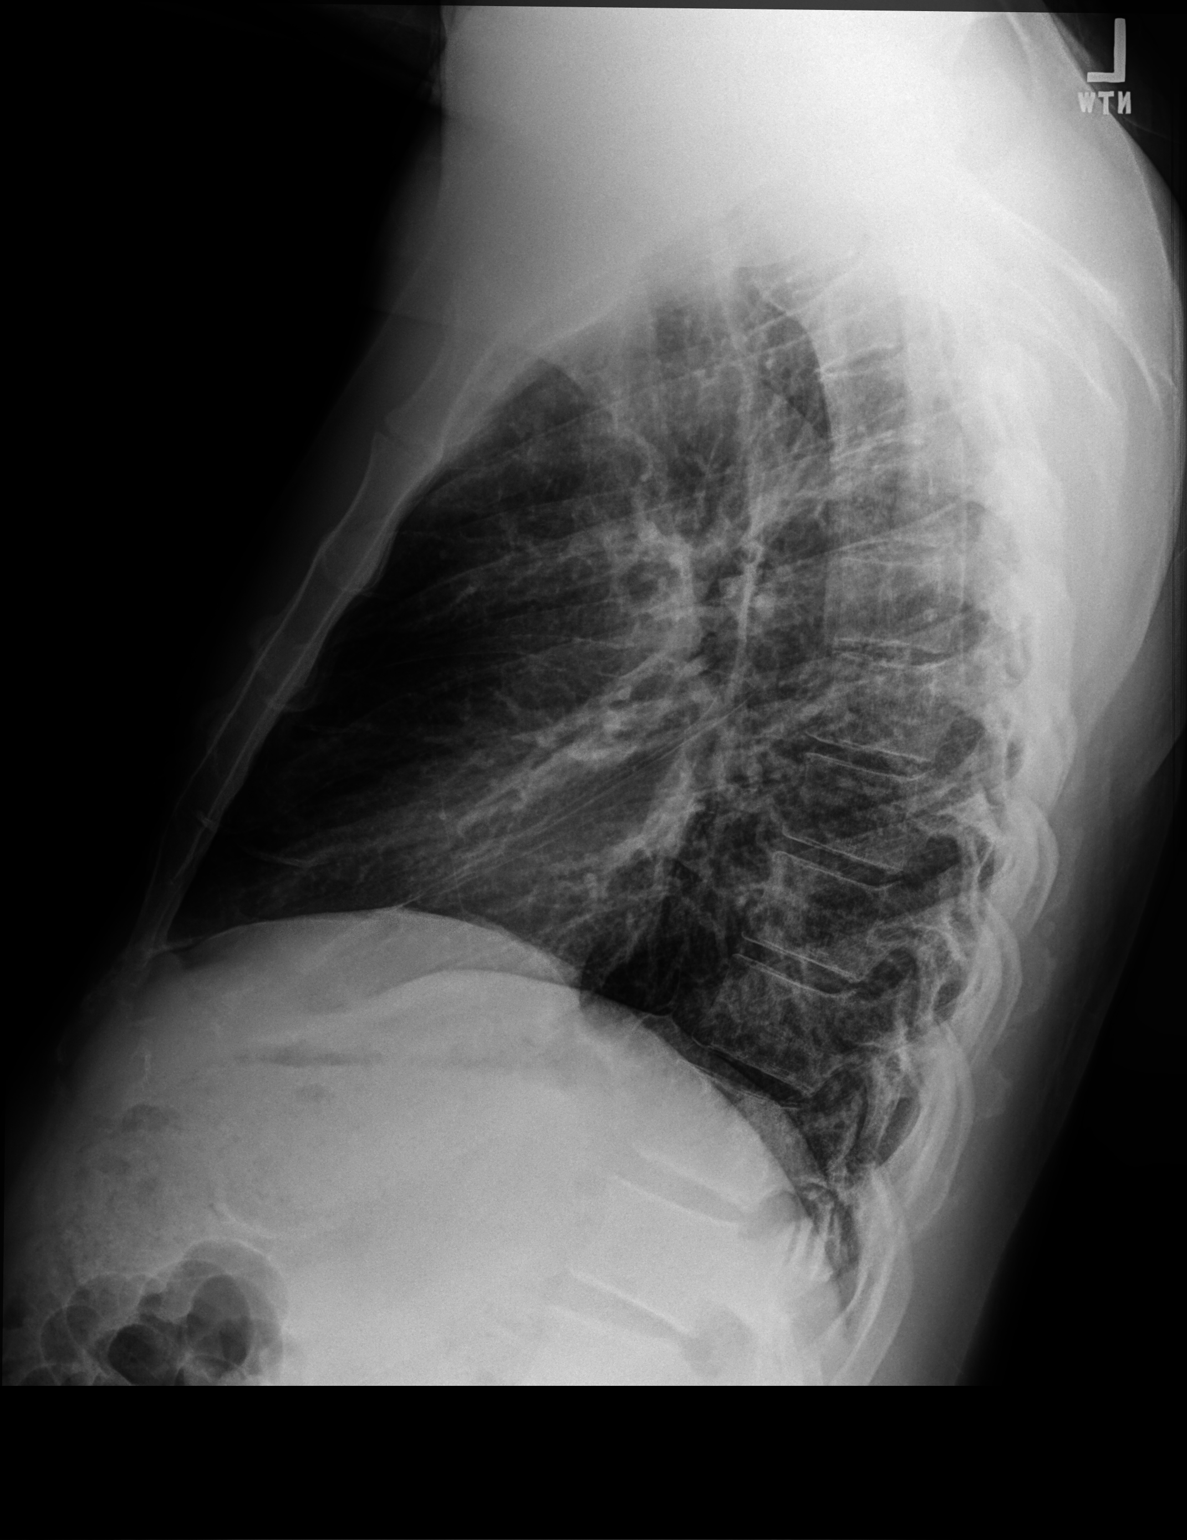

[2 of 2 positions shown; findings below may reference images not displayed]

FINDINGS: There is mild right midlung atelectasis. Lungs elsewhere are clear.
Heart size and pulmonary vascularity are normal. No adenopathy. No
bone lesions.
IMPRESSION: Mild atelectasis right mid lung. No edema or consolidation. Cardiac
silhouette within normal limits. No evident adenopathy.

## 2019-09-20 ENCOUNTER — Other Ambulatory Visit: Payer: Self-pay

## 2019-09-20 ENCOUNTER — Ambulatory Visit: Payer: Commercial Managed Care - PPO | Admitting: Family Medicine

## 2019-09-20 ENCOUNTER — Encounter: Payer: Self-pay | Admitting: Family Medicine

## 2019-09-20 VITALS — BP 134/84 | HR 73 | Ht 68.0 in | Wt 182.0 lb

## 2019-09-20 DIAGNOSIS — M7711 Lateral epicondylitis, right elbow: Secondary | ICD-10-CM | POA: Diagnosis not present

## 2019-09-20 NOTE — Progress Notes (Signed)
   I, [name], LAT, ATC, am serving as scribe for Dr. Lynne Leader.  Jacob Foster is a 54 y.o. male who presents to [place] [facility] today for f/u of R elbow pain.  He was last seen by Dr. Georgina Snell on 09/07/19 and was prescribed nitroglycerin patches and was provided eccentric strengthening exercises for his R forearm extensors.  He has a hx of a prior surgery for his R lateral epicondylitis in 2018.  Since his last visit, pt reports he feels the same, but the patches cause his head to hurt so does not keep them on for long periods of time.  He notes he is using a full patch and not a quarter of a patch.   Pertinent review of systems: No fevers or chills  Relevant historical information: History of prior tennis elbow surgery.   Exam:  BP 134/84 (BP Location: Left Arm, Patient Position: Sitting, Cuff Size: Normal)   Pulse 73   Ht 5\' 8"  (1.727 m)   Wt 182 lb (82.6 kg)   SpO2 97%   BMI 27.67 kg/m  General: Well Developed, well nourished, and in no acute distress.   MSK: Right elbow normal-appearing normal motion normal strength.      Assessment and Plan: 54 y.o. male with right elbow pain.  Tennis elbow/lateral epicondylitis.  Patient had some treading water over the last 2 weeks.  He is done some home exercise program and tried using the nitro patches.  However he was using the nitro patches incorrectly using a full patch instead of a quarter of a patch.  Unsurprisingly he had trouble tolerating the full patch.  Additionally he was unable to schedule occupational therapy.  Plan to emphasize occupational therapy.  New referral placed.  I provided patient with phone number to call himself to schedule. Additionally emphasized using a quarter of a patch instead of a full patch which she should be able to tolerate better.  If not better in about 2 to 3 weeks or at least improving in 2 or 3 weeks Would likely be PRP injection.  Discussed this procedure and cost with  patient.  He will keep me informed on how he is feeling.  New work note provided today.    Orders Placed This Encounter  Procedures  . Ambulatory referral to Occupational Therapy    Referral Priority:   Routine    Referral Type:   Occupational Therapy    Referral Reason:   Specialty Services Required    Requested Specialty:   Occupational Therapy    Number of Visits Requested:   1   No orders of the defined types were placed in this encounter.    Discussed warning signs or symptoms. Please see discharge instructions. Patient expresses understanding.   The above documentation has been reviewed and is accurate and complete Lynne Leader

## 2019-09-20 NOTE — Patient Instructions (Addendum)
Thank you for coming in today. Use a 1/4 of the patch on the elbow.  You can make smaller if needed.   Nitroglycerin Protocol   Apply 1/4 nitroglycerin patch to affected area daily.  Change position of patch within the affected area every 24 hours.  You may experience a headache during the first 1-2 weeks of using the patch, these should subside.  If you experience headaches after beginning nitroglycerin patch treatment, you may take your preferred over the counter pain reliever.  Another side effect of the nitroglycerin patch is skin irritation or rash related to patch adhesive.  Please notify our office if you develop more severe headaches or rash, and stop the patch.  Tendon healing with nitroglycerin patch may require 12 to 24 weeks depending on the extent of injury.  Men should not use if taking Viagra, Cialis, or Levitra.   Do not use if you have migraines or rosacea.   Plan for therapy.  If not improving in 2-3 weeks let me know and I will plan for PRP injection.

## 2019-09-27 ENCOUNTER — Ambulatory Visit: Payer: Commercial Managed Care - PPO | Attending: Family Medicine | Admitting: Occupational Therapy

## 2019-09-27 ENCOUNTER — Other Ambulatory Visit: Payer: Self-pay

## 2019-09-27 DIAGNOSIS — M6281 Muscle weakness (generalized): Secondary | ICD-10-CM | POA: Diagnosis present

## 2019-09-27 DIAGNOSIS — M25521 Pain in right elbow: Secondary | ICD-10-CM

## 2019-09-27 DIAGNOSIS — M25621 Stiffness of right elbow, not elsewhere classified: Secondary | ICD-10-CM

## 2019-09-27 NOTE — Therapy (Signed)
Jacob Foster [address] [place], [place], [address] Phone: [phone]   Fax:  [phone]  Occupational Therapy Evaluation  Patient Details  Name: Jacob Foster MRN: [id number] Date of Birth: 18-Jul-1965 Referring Provider (OT): Dr. Georgina Snell   Encounter Date: 09/27/2019  OT End of Session - 09/27/19 1122    Visit Number  1    Number of Visits  17    Date for OT Re-Evaluation  11/26/19    Authorization Type  UMR    OT Start Time  0731    OT Stop Time  0758    OT Time Calculation (min)  27 min    Activity Tolerance  Patient tolerated treatment well    Behavior During Therapy  [facility] for tasks assessed/performed       Past Medical History:  Diagnosis Date  . Abscess of axilla   . Arthritis   . Depression    seasonal affective D/O   . Family history of colon cancer 12/08/2018   Father and brother. Sees Dr. Enis Gash  . GERD (gastroesophageal reflux disease)   . Hidradenitis suppurativa   . History of chickenpox   . Hyperlipemia   . Medical history non-contributory   . Neck pain   . Serrated polyp of colon 12/08/2018    Past Surgical History:  Procedure Laterality Date  . ABSCESS DRAINAGE    . ELBOW SURGERY      There were no vitals filed for this visit.  Subjective Assessment - 09/27/19 0734    Subjective   Pt reports elbow pain    Pertinent History  s/p surgery for R lateral epicondylitis 2018, arthritis and hyperlipidemia    Patient Stated Goals  get better    Currently in Pain?  Yes    Pain Score  8     Pain Location  Elbow    Pain Orientation  Right    Pain Descriptors / Indicators  Aching    Pain Type  Chronic pain    Pain Onset  More than a month ago    Pain Frequency  Intermittent    Aggravating Factors   overuse    Pain Relieving Factors  rest        [facility] OT Assessment - 09/27/19 0735      Assessment   Medical Diagnosis  R lateral epicondylitis    Referring Provider (OT)  Dr. Sherene Sires     Onset Date/Surgical Date  09/20/19   referral     Precautions   Precautions  None      Home  Environment   Family/patient expects to be discharged to:  Private residence    Lives With  Spouse;Family      Prior Function   Level of Independence  Independent    Vocation  --   out on short term disability   Vocation Requirements  sawing, buffing      ADL   ADL comments  modified independent with all basic ADLS   Pt reports difficulty lifting and sanding     IADL   Light Housekeeping  Performs light daily tasks such as dishwashing, bed making   Pt reports pain with lifting items     Mobility   Mobility Status  Independent      Written Expression   Dominant Hand  Right      Cognition   Overall Cognitive Status  Within Functional Limits for tasks assessed      Sensation   Light Touch  Impaired by gross assessment   intermittant numbness dorsal hand     ROM / Strength   AROM / PROM / Strength  AROM      AROM   Overall AROM   Deficits;Due to pain    AROM Assessment Site  Elbow;Forearm;Wrist;Finger    Right/Left Elbow  Right;Left    Right Elbow Flexion  120    Right Elbow Extension  0    Left Elbow Flexion  115    Left Elbow Extension  -20    Right/Left Forearm  Right;Left    Right Forearm Pronation  80 Degrees    Right Forearm Supination  80 Degrees    Left Forearm Pronation  60 Degrees    Left Forearm Supination  60 Degrees    Right/Left Wrist  Left    Right Wrist Extension  45 Degrees    Right Wrist Flexion  55 Degrees    Left Wrist Extension  45 Degrees    Left Wrist Flexion  40 Degrees      Hand Function   Right Hand Grip (lbs)  29.1   16.9 elbow ext and pronate   Left Hand Grip (lbs)  29.3   11.9 with elbow ext and pronation         Pt was given handout regarding massage use of heat and wrist splint and counterforce brace wear. Pt verbalized understanding. Pt states he has a wrist brace and counterforce brace at home.              OT  Short Term Goals - 09/27/19 1202      OT SHORT TERM GOAL #1   Title  I with initial HEP.    Time  4    Period  Weeks    Status  New    Target Date  10/27/19      OT SHORT TERM GOAL #2   Title  Pt will verbalize understanding of activity modification to minimize pain and splint / brace wear, prn.    Time  4    Period  Weeks    Status  New      OT SHORT TERM GOAL #3   Title  Pt will demonstrate elbow flexion 125 in prep for functional use RUE   Time  4    Period  Weeks    Status  New      OT SHORT TERM GOAL #4   Title  Pt will demonstrate RUE forearm supination/ pronation 85 for increased ease with ADLs.    Time  4    Period  Weeks    Status  New        OT Long Term Goals - 09/27/19 1208      OT LONG TERM GOAL #1   Title  I with updated HEP.    Time  8    Period  Weeks    Status  New    Target Date  11/26/19      OT LONG TERM GOAL #2   Title  Pt will increase RUE grip strength to 35 lbs or greater for increased functional use(tested in traditional position)    Baseline  RUE 29.1, LUE 29.3    Time  8    Period  Weeks    Status  New      OT LONG TERM GOAL #3   Title  Pt will report pain no greater than 3/10 for ADLs/IADLs and simulated work activities.    Time  Bud - 09/27/19 1157    Clinical Impression Statement  Jacob Foster is a 54 y.o. with diagnosis of right lateral epicondylitis.  He was previously seen  by Dr. Georgina Snell  and was prescribed nitroglycerin patches and was provided eccentric strengthening exercises for his R forearm extensors.  He has a hx of a prior surgery for his R lateral epicondylitis in 2018. Pt is currently not working due to right lateral epicondylitis. Pt normally performs sanding activities He presents with the following deficits: decreased strength, pain, decreased ROM, decreased RUE functional use, decreased ROM which impedes performance of ADLs/IADLs and work activities. Pt can benefit  from skilled occupational therapy to address these deficits in order to maximize pt safety and I with daily activities.Pt reports he has lateral epicondylitis in LUE, but it will need to go through worker's comp. OT to address RUE at this time.    OT Occupational Profile and History  Problem Focused Assessment - Including review of records relating to presenting problem    Occupational performance deficits (Please refer to evaluation for details):  ADL's;IADL's;Work;Rest and Sleep    Body Structure / Function / Physical Skills  ADL;Mobility;Strength;Balance;Endurance;Flexibility;UE functional use;FMC;Coordination;GMC;Decreased knowledge of precautions;ROM;Decreased knowledge of use of DME;IADL;Dexterity    Rehab Potential  Good    Clinical Decision Making  Limited treatment options, no task modification necessary    Comorbidities Affecting Occupational Performance:  May have comorbidities impacting occupational performance    Modification or Assistance to Complete Evaluation   No modification of tasks or assist necessary to complete eval    OT Frequency  2x / week    OT Duration  8 weeks   or 16 visits total over 12 weeks   OT Treatment/Interventions  Self-care/ADL training;Therapeutic exercise;Splinting;Manual Therapy;Neuromuscular education;Ultrasound;Iontophoresis;Therapeutic activities;Cognitive remediation/compensation;Cryotherapy;Paraffin;DME and/or AE instruction;Fluidtherapy;Electrical Stimulation;Patient/family education;Passive range of motion;Contrast Bath;Moist Heat    Plan  [place], issue phase I stretches, if ionto order returned may begin ionto       Patient will benefit from skilled therapeutic intervention in order to improve the following deficits and impairments:   Body Structure / Function / Physical Skills: ADL, Mobility, Strength, Balance, Endurance, Flexibility, UE functional use, FMC, Coordination, GMC, Decreased knowledge of precautions, ROM, Decreased knowledge of use of DME,  IADL, Dexterity       Visit Diagnosis: Pain in right elbow - Plan: Ot plan of care cert/re-cert  Muscle weakness (generalized) - Plan: Ot plan of care cert/re-cert  Stiffness of right elbow, not elsewhere classified - Plan: Ot plan of care cert/re-cert    Problem List Patient Active Problem List   Diagnosis Date Noted  . COVID-19 virus infection 03/15/2019  . Family history of colon cancer 12/08/2018  . Serrated polyp of colon 12/08/2018  . Gastroesophageal reflux disease without esophagitis 08/20/2018  . GAD (generalized anxiety disorder) 04/23/2018  . Seasonal affective disorder ([place]) 04/23/2018  . Overweight (BMI 25.0-29.9) 09/05/2017    [place] Sees 09/27/2019, 4:09 PM Theone Murdoch, OTR/L [phone] Phone: [phone] 4:09 PM 09/27/19 [facility] [address] [place], [place], [address] Phone: [phone]   Fax:  [phone]  Name: Chip Davin MRN: [id number] Date of Birth: 10-25-1965

## 2019-09-29 ENCOUNTER — Ambulatory Visit: Payer: Commercial Managed Care - PPO | Admitting: Occupational Therapy

## 2019-09-29 ENCOUNTER — Other Ambulatory Visit: Payer: Self-pay

## 2019-09-29 DIAGNOSIS — M25521 Pain in right elbow: Secondary | ICD-10-CM | POA: Diagnosis not present

## 2019-09-29 DIAGNOSIS — M6281 Muscle weakness (generalized): Secondary | ICD-10-CM

## 2019-09-29 NOTE — Therapy (Signed)
[facility] [address] [place], [place], [address] Phone: [phone]   Fax:  [phone]  Occupational Therapy Treatment  Patient Details  Name: Jacob Foster MRN: [id number] Date of Birth: 1965/09/13 Referring Provider (OT): Dr. Sherene Sires   Encounter Date: 09/29/2019  OT End of Session - 09/29/19 1322    Visit Number  2    Number of Visits  17    Date for OT Re-Evaluation  11/26/19    Authorization Type  UMR    OT Start Time  1145    OT Stop Time  1230    OT Time Calculation (min)  45 min    Activity Tolerance  Patient tolerated treatment well    Behavior During Therapy  [facility] for tasks assessed/performed       Past Medical History:  Diagnosis Date  . Abscess of axilla   . Arthritis   . Depression    seasonal affective D/O   . Family history of colon cancer 12/08/2018   Father and brother. Sees Dr. Enis Gash  . GERD (gastroesophageal reflux disease)   . Hidradenitis suppurativa   . History of chickenpox   . Hyperlipemia   . Medical history non-contributory   . Neck pain   . Serrated polyp of colon 12/08/2018    Past Surgical History:  Procedure Laterality Date  . ABSCESS DRAINAGE    . ELBOW SURGERY      There were no vitals filed for this visit.  Subjective Assessment - 09/29/19 1151    Subjective   Pt reports pain better at end of session    Pertinent History  s/p surgery for R lateral epicondylitis 2018, arthritis and hyperlipidemia    Patient Stated Goals  get better    Currently in Pain?  Yes    Pain Score  6     Pain Location  Elbow    Pain Orientation  Right    Pain Descriptors / Indicators  Aching    Pain Onset  More than a month ago    Pain Frequency  Intermittent    Aggravating Factors   overuse    Pain Relieving Factors  rest       Reviewed initial use of heat, proper technique for massage, and use of counterforce brace and wrist brace from phase I of lateral epicondylitis.  Issued and reviewed phase II stretches ex 1-3 only and instructed to add #4 next week. Pt return demo of each #1-3.  Also discussed body mechanics with lifting and use of bigger joints whenever possible and keeping wrist neutral and recommended wearing splint for these activities. Pt also tense and guarded entire RUE and encouraged pt to relax as this can increase pain.  Continuous [place] x 8 min. At proximal lateral forearm at 100%, 3 Mhz, 0.8 wts/cm2 (Ionto order has been signed however did not have time to begin today d/t time constraints as pt needed review and reinforcement)                      OT Short Term Goals - 09/29/19 1322      OT SHORT TERM GOAL #1   Title  I with initial HEP.    Time  4    Period  Weeks    Status  On-going    Target Date  10/27/19      OT SHORT TERM GOAL #2   Title  Pt will verbalize understanding of activity modification to minimize  pain and splint / brace wear, prn.    Time  4    Period  Weeks    Status  On-going      OT SHORT TERM GOAL #3   Title  Pt will demonstrate elbow flexion 125 in prep for funcitonal use    Time  4    Period  Weeks    Status  New      OT SHORT TERM GOAL #4   Title  Pt will demonstrate RUE forearm supination/ pronation 85 for increased ease with ADLs.    Time  4    Period  Weeks    Status  New        OT Long Term Goals - 09/27/19 1208      OT LONG TERM GOAL #1   Title  I with updated HEP.    Time  8    Period  Weeks    Status  New    Target Date  11/26/19      OT LONG TERM GOAL #2   Title  Pt will increase RUE grip strength to 35 lbs or greater for increased functional use(tested in traditional position)    Baseline  RUE 29.1, LUE 29.3    Time  8    Period  Weeks    Status  New      OT LONG TERM GOAL #3   Title  Pt will report pain no greater than 3/10 for ADLs/IADLs and simulated work activities.    Time  8    Period  Weeks    Status  New            Plan - 09/29/19 1322     Clinical Impression Statement  Pt with greater understanding of use of heat, proper technique for massage, initial stretching to wrist extensors, and awareness of body mechanics.    Occupational performance deficits (Please refer to evaluation for details):  ADL's;IADL's;Work;Rest and Sleep    Body Structure / Function / Physical Skills  ADL;Mobility;Strength;Balance;Endurance;Flexibility;UE functional use;FMC;Coordination;GMC;Decreased knowledge of precautions;ROM;Decreased knowledge of use of DME;IADL;Dexterity    Rehab Potential  Good    OT Frequency  2x / week    OT Duration  8 weeks   or 16 visits total over 12 weeks   OT Treatment/Interventions  Self-care/ADL training;Therapeutic exercise;Splinting;Manual Therapy;Neuromuscular education;Ultrasound;Iontophoresis;Therapeutic activities;Cognitive remediation/compensation;Cryotherapy;Paraffin;DME and/or AE instruction;Fluidtherapy;Electrical Stimulation;Patient/family education;Passive range of motion;Contrast Bath;Moist Heat    Plan  Begin iontophoresis (dose #1) and review precautions/contraindications prior to starting (order has been signed w/ initial eval). Pt should have removed nitroglycerin patch the evening before    Consulted and Agree with Plan of Care  Patient       Patient will benefit from skilled therapeutic intervention in order to improve the following deficits and impairments:   Body Structure / Function / Physical Skills: ADL, Mobility, Strength, Balance, Endurance, Flexibility, UE functional use, FMC, Coordination, GMC, Decreased knowledge of precautions, ROM, Decreased knowledge of use of DME, IADL, Dexterity       Visit Diagnosis: Pain in right elbow  Muscle weakness (generalized)    Problem List Patient Active Problem List   Diagnosis Date Noted  . COVID-19 virus infection 03/15/2019  . Family history of colon cancer 12/08/2018  . Serrated polyp of colon 12/08/2018  . Gastroesophageal reflux disease without  esophagitis 08/20/2018  . GAD (generalized anxiety disorder) 04/23/2018  . Seasonal affective disorder ([place]) 04/23/2018  . Overweight (BMI 25.0-29.9) 09/05/2017    Carey Bullocks, OTR/L 09/29/2019,  1:24 PM  [facility] [address] [place], [place], [address] Phone: [phone]   Fax:  [phone]  Name: Hershell Bowmer MRN: [id number] Date of Birth: 05-04-1966

## 2019-09-30 ENCOUNTER — Encounter: Payer: Self-pay | Admitting: Family Medicine

## 2019-09-30 ENCOUNTER — Telehealth: Payer: Self-pay | Admitting: *Deleted

## 2019-09-30 NOTE — Telephone Encounter (Signed)
LM on pt's cell phone stating that updated work letter has been sent through [employer] or he or wife can pick up at the front desk if he prefers.  Asked pt to call us back if he would like to pick up the letter so we can have it signed and ready for him at the front desk.

## 2019-09-30 NOTE — Telephone Encounter (Signed)
Pt's wife called stating that the pt has been going to PT and it seems to be really helping. Dr. Georgina Snell wrote the pt out of work 3/9-3/23. Pt's wife is asking if this work note can be extended to 4/6 so the pt can continue to go to PT for an additional 2 week.

## 2019-09-30 NOTE — Telephone Encounter (Signed)
Letter written. Letter was sent through [place] but a copy was printed and is available for pickup as well.

## 2019-10-05 ENCOUNTER — Ambulatory Visit: Payer: Commercial Managed Care - PPO | Admitting: Occupational Therapy

## 2019-10-05 ENCOUNTER — Other Ambulatory Visit: Payer: Self-pay

## 2019-10-05 ENCOUNTER — Encounter: Payer: Self-pay | Admitting: Occupational Therapy

## 2019-10-05 DIAGNOSIS — M25521 Pain in right elbow: Secondary | ICD-10-CM | POA: Diagnosis not present

## 2019-10-05 DIAGNOSIS — M6281 Muscle weakness (generalized): Secondary | ICD-10-CM

## 2019-10-05 DIAGNOSIS — M25621 Stiffness of right elbow, not elsewhere classified: Secondary | ICD-10-CM

## 2019-10-05 NOTE — Therapy (Signed)
[facility] [address] [place], [place], [address] Phone: [phone]   Fax:  [phone]  Occupational Therapy Treatment  Patient Details  Name: Jacob Foster MRN: [id number] Date of Birth: 1966/01/31 Referring Provider (OT): Dr. Sherene Sires   Encounter Date: 10/05/2019  OT End of Session - 10/05/19 0809    Visit Number  3    Number of Visits  17    Date for OT Re-Evaluation  11/26/19    Authorization Type  UMR    OT Start Time  0806    OT Stop Time  0847    OT Time Calculation (min)  41 min    Activity Tolerance  Patient tolerated treatment well    Behavior During Therapy  [facility] for tasks assessed/performed       Past Medical History:  Diagnosis Date  . Abscess of axilla   . Arthritis   . Depression    seasonal affective D/O   . Family history of colon cancer 12/08/2018   Father and brother. Sees Dr. Enis Gash  . GERD (gastroesophageal reflux disease)   . Hidradenitis suppurativa   . History of chickenpox   . Hyperlipemia   . Medical history non-contributory   . Neck pain   . Serrated polyp of colon 12/08/2018    Past Surgical History:  Procedure Laterality Date  . ABSCESS DRAINAGE    . ELBOW SURGERY      There were no vitals filed for this visit.  Subjective Assessment - 10/05/19 0808    Subjective   --    Pertinent History  s/p surgery for R lateral epicondylitis 2018, arthritis and hyperlipidemia    Patient Stated Goals  get better    Currently in Pain?  Yes    Pain Score  5     Pain Location  Elbow    Pain Orientation  Right    Pain Descriptors / Indicators  Aching    Pain Type  Chronic pain    Pain Onset  More than a month ago    Pain Frequency  Intermittent    Aggravating Factors   overuse    Pain Relieving Factors  rest       Reviewed Iontophorosis Precautions and Contraindications with pt. And issued handout.  Pt verbalized understanding  Iontophoresis treatment #1 with 4mg /mL  dexamethasone (2.0cc), intensity=1.9-2.2 with no adverse reactions to lateral elbow x22 min with set-up and initial adjustments.  Mild pink discoloration noted at medication pad at end of treatment.        OT Education - 10/05/19 0831    Education Details  Reviewed phase II stretches ex 1-4 and pt returned demo each with min v.c. (used handout as guide)    Person(s) Educated  Patient    Methods  Explanation;Demonstration;Verbal cues    Comprehension  Verbalized understanding;Returned demonstration       OT Short Term Goals - 09/29/19 1322      OT SHORT TERM GOAL #1   Title  I with initial HEP.    Time  4    Period  Weeks    Status  On-going    Target Date  10/27/19      OT SHORT TERM GOAL #2   Title  Pt will verbalize understanding of activity modification to minimize pain and splint / brace wear, prn.    Time  4    Period  Weeks    Status  On-going      OT SHORT  TERM GOAL #3   Title  Pt will demonstrate elbow flexion 125 in prep for funcitonal use    Time  4    Period  Weeks    Status  New      OT SHORT TERM GOAL #4   Title  Pt will demonstrate RUE forearm supination/ pronation 85 for increased ease with ADLs.    Time  4    Period  Weeks    Status  New        OT Long Term Goals - 09/27/19 1208      OT LONG TERM GOAL #1   Title  I with updated HEP.    Time  8    Period  Weeks    Status  New    Target Date  11/26/19      OT LONG TERM GOAL #2   Title  Pt will increase RUE grip strength to 35 lbs or greater for increased functional use(tested in traditional position)    Baseline  RUE 29.1, LUE 29.3    Time  8    Period  Weeks    Status  New      OT LONG TERM GOAL #3   Title  Pt will report pain no greater than 3/10 for ADLs/IADLs and simulated work activities.    Time  8    Period  Weeks    Status  New            Plan - 10/05/19 0816    Clinical Impression Statement  Pt tolerated initial Ionto treatment well and was able to perform stretching  with min v.c.    Occupational performance deficits (Please refer to evaluation for details):  ADL's;IADL's;Work;Rest and Sleep    Body Structure / Function / Physical Skills  ADL;Mobility;Strength;Balance;Endurance;Flexibility;UE functional use;FMC;Coordination;GMC;Decreased knowledge of precautions;ROM;Decreased knowledge of use of DME;IADL;Dexterity    Rehab Potential  Good    OT Frequency  2x / week    OT Duration  8 weeks   or 16 visits total over 12 weeks   OT Treatment/Interventions  Self-care/ADL training;Therapeutic exercise;Splinting;Manual Therapy;Neuromuscular education;Ultrasound;Iontophoresis;Therapeutic activities;Cognitive remediation/compensation;Cryotherapy;Paraffin;DME and/or AE instruction;Fluidtherapy;Electrical Stimulation;Patient/family education;Passive range of motion;Contrast Bath;Moist Heat    Plan  continue Iontophoresis. Pt should have removed nitroglycerin patch the evening before    Consulted and Agree with Plan of Care  Patient       Patient will benefit from skilled therapeutic intervention in order to improve the following deficits and impairments:   Body Structure / Function / Physical Skills: ADL, Mobility, Strength, Balance, Endurance, Flexibility, UE functional use, FMC, Coordination, GMC, Decreased knowledge of precautions, ROM, Decreased knowledge of use of DME, IADL, Dexterity       Visit Diagnosis: Pain in right elbow  Muscle weakness (generalized)  Stiffness of right elbow, not elsewhere classified    Problem List Patient Active Problem List   Diagnosis Date Noted  . COVID-19 virus infection 03/15/2019  . Family history of colon cancer 12/08/2018  . Serrated polyp of colon 12/08/2018  . Gastroesophageal reflux disease without esophagitis 08/20/2018  . GAD (generalized anxiety disorder) 04/23/2018  . Seasonal affective disorder ([place]) 04/23/2018  . Overweight (BMI 25.0-29.9) 09/05/2017    [facility] 10/05/2019, 8:33 AM  [facility] [address] [place], [place], [address] Phone: [phone]   Fax:  [phone]  Name: Jacob Foster MRN: [id number] Date of Birth: 12-28-1965   Vianne Bulls, OTR/L [facility] [place],  [place]  [phone] phone [phone] 10/05/19 8:33 AM

## 2019-10-07 ENCOUNTER — Other Ambulatory Visit: Payer: Self-pay

## 2019-10-07 ENCOUNTER — Ambulatory Visit: Payer: Commercial Managed Care - PPO | Admitting: Occupational Therapy

## 2019-10-07 DIAGNOSIS — M25521 Pain in right elbow: Secondary | ICD-10-CM

## 2019-10-07 DIAGNOSIS — M25621 Stiffness of right elbow, not elsewhere classified: Secondary | ICD-10-CM

## 2019-10-07 DIAGNOSIS — M6281 Muscle weakness (generalized): Secondary | ICD-10-CM

## 2019-10-07 NOTE — Therapy (Signed)
[name] [address] [place], [place], [address] Phone: [phone]   Fax:  [phone]  Occupational Therapy Treatment  Patient Details  Name: Jacob Foster MRN: [id number] Date of Birth: 05/12/1966 Referring Provider (OT): Dr. Sherene Sires   Encounter Date: 10/07/2019  OT End of Session - 10/07/19 0749    Visit Number  4    Number of Visits  17    Date for OT Re-Evaluation  11/26/19    Authorization Type  UMR    OT Start Time  0717    OT Stop Time  0757    OT Time Calculation (min)  40 min    Activity Tolerance  Patient tolerated treatment well    Behavior During Therapy  [facility] for tasks assessed/performed       Past Medical History:  Diagnosis Date  . Abscess of axilla   . Arthritis   . Depression    seasonal affective D/O   . Family history of colon cancer 12/08/2018   Father and brother. Sees Dr. Enis Gash  . GERD (gastroesophageal reflux disease)   . Hidradenitis suppurativa   . History of chickenpox   . Hyperlipemia   . Medical history non-contributory   . Neck pain   . Serrated polyp of colon 12/08/2018    Past Surgical History:  Procedure Laterality Date  . ABSCESS DRAINAGE    . ELBOW SURGERY      There were no vitals filed for this visit.  Subjective Assessment - 10/07/19 0748    Subjective   Pt reports pain is improving    Pertinent History  s/p surgery for R lateral epicondylitis 2018, arthritis and hyperlipidemia    Patient Stated Goals  get better    Currently in Pain?  Yes    Pain Score  2     Pain Location  Elbow    Pain Orientation  Right    Pain Descriptors / Indicators  Aching    Pain Type  Acute pain    Pain Onset  More than a month ago    Pain Frequency  Intermittent    Aggravating Factors   malpositioning    Pain Relieving Factors  heat repositioning             Treatment: Reviewed phase II stretches, 10 reps each, min v.c for positioning. Pt was instructed to  hold off on use of nitroglycerin patches so we can see if ionto is helping. Pt was instructed not to use heat today. Iontophoresis treatment #2 with 4mg /mL dexamethasone (2.0 cc), intensity=1.8 with no adverse reactions to lateral elbow x22 min with set-up and initial adjustments.  Mild pink discoloration noted at medication pad at end of treatment.                   OT Short Term Goals - 09/29/19 1322      OT SHORT TERM GOAL #1   Title  I with initial HEP.    Time  4    Period  Weeks    Status  On-going    Target Date  10/27/19      OT SHORT TERM GOAL #2   Title  Pt will verbalize understanding of activity modification to minimize pain and splint / brace wear, prn.    Time  4    Period  Weeks    Status  On-going      OT SHORT TERM GOAL #3   Title  Pt will demonstrate  elbow flexion 125 in prep for funcitonal use    Time  4    Period  Weeks    Status  New      OT SHORT TERM GOAL #4   Title  Pt will demonstrate RUE forearm supination/ pronation 85 for increased ease with ADLs.    Time  4    Period  Weeks    Status  New        OT Long Term Goals - 09/27/19 1208      OT LONG TERM GOAL #1   Title  I with updated HEP.    Time  8    Period  Weeks    Status  New    Target Date  11/26/19      OT LONG TERM GOAL #2   Title  Pt will increase RUE grip strength to 35 lbs or greater for increased functional use(tested in traditional position)    Baseline  RUE 29.1, LUE 29.3    Time  8    Period  Weeks    Status  New      OT LONG TERM GOAL #3   Title  Pt will report pain no greater than 3/10 for ADLs/IADLs and simulated work activities.    Time  8    Period  Weeks    Status  New              Patient will benefit from skilled therapeutic intervention in order to improve the following deficits and impairments:           Visit Diagnosis: Pain in right elbow  Muscle weakness (generalized)  Stiffness of right elbow, not elsewhere  classified    Problem List Patient Active Problem List   Diagnosis Date Noted  . COVID-19 virus infection 03/15/2019  . Family history of colon cancer 12/08/2018  . Serrated polyp of colon 12/08/2018  . Gastroesophageal reflux disease without esophagitis 08/20/2018  . GAD (generalized anxiety disorder) 04/23/2018  . Seasonal affective disorder ([place]) 04/23/2018  . Overweight (BMI 25.0-29.9) 09/05/2017    Jacob Foster 10/07/2019, 7:50 AM  [facility] [address] [place], [place], [address] Phone: [phone]   Fax:  [phone]  Name: Jacob Foster MRN: [id number] Date of Birth: 04/06/1966

## 2019-10-10 ENCOUNTER — Other Ambulatory Visit: Payer: Self-pay

## 2019-10-10 ENCOUNTER — Ambulatory Visit: Payer: Commercial Managed Care - PPO | Admitting: Occupational Therapy

## 2019-10-10 DIAGNOSIS — M25621 Stiffness of right elbow, not elsewhere classified: Secondary | ICD-10-CM

## 2019-10-10 DIAGNOSIS — M25521 Pain in right elbow: Secondary | ICD-10-CM | POA: Diagnosis not present

## 2019-10-10 DIAGNOSIS — M6281 Muscle weakness (generalized): Secondary | ICD-10-CM

## 2019-10-10 NOTE — Therapy (Signed)
[facility] [address] [place], [place], [address] Phone: [phone]   Fax:  [phone]  Occupational Therapy Treatment  Patient Details  Name: Jacob Foster MRN: [id number] Date of Birth: [birth year] Referring Provider (OT): Dr. Sherene Sires   Encounter Date: 10/10/2019  OT End of Session - 10/10/19 0954    Visit Number  5    Number of Visits  17    Date for OT Re-Evaluation  11/26/19    Authorization Type  UMR    OT Start Time  0933    OT Stop Time  1015    OT Time Calculation (min)  42 min    Activity Tolerance  Patient tolerated treatment well    Behavior During Therapy  [facility] for tasks assessed/performed       Past Medical History:  Diagnosis Date  . Abscess of axilla   . Arthritis   . Depression    seasonal affective D/O   . Family history of colon cancer 12/08/2018   Father and brother. Sees Dr. Enis Gash  . GERD (gastroesophageal reflux disease)   . Hidradenitis suppurativa   . History of chickenpox   . Hyperlipemia   . Medical history non-contributory   . Neck pain   . Serrated polyp of colon 12/08/2018    Past Surgical History:  Procedure Laterality Date  . ABSCESS DRAINAGE    . ELBOW SURGERY      There were no vitals filed for this visit.  Subjective Assessment - 10/10/19 0954    Subjective   Pt reports his elbow bothered him a little this weekend    Pertinent History  s/p surgery for R lateral epicondylitis 2018, arthritis and hyperlipidemia    Patient Stated Goals  get better    Currently in Pain?  Yes    Pain Score  4     Pain Location  Elbow    Pain Orientation  Right    Pain Descriptors / Indicators  Aching    Pain Type  Chronic pain    Pain Onset  More than a month ago    Pain Frequency  Intermittent    Aggravating Factors   overuse    Pain Relieving Factors  rest           Treatment: Reviewed phase II stretches, 10 reps each, min v.c for positioning. Pt was instructed  to hold off on use of nitroglycerin patches so we can see if ionto is helping. Pt was instructed not to use heat today. Iontophoresis treatment #3 with 4mg /mL dexamethasone (2.0 cc), intensity=1.7-1.9 with no adverse reactions to lateral elbow x22 min with set-up and initial adjustments.1 Water blister present noted at medication pad at end of treatment. Reviewed wear of elbow and wrist braces if pt is experiencing  Pain, pt did not bring in braces today.                   OT Short Term Goals - 09/29/19 1322      OT SHORT TERM GOAL #1   Title  I with initial HEP.    Time  4    Period  Weeks    Status  On-going    Target Date  10/27/19      OT SHORT TERM GOAL #2   Title  Pt will verbalize understanding of activity modification to minimize pain and splint / brace wear, prn.    Time  4    Period  Weeks  Status  On-going      OT SHORT TERM GOAL #3   Title  Pt will demonstrate elbow flexion 125 in prep for funcitonal use    Time  4    Period  Weeks    Status  New      OT SHORT TERM GOAL #4   Title  Pt will demonstrate RUE forearm supination/ pronation 85 for increased ease with ADLs.    Time  4    Period  Weeks    Status  New        OT Long Term Goals - 09/27/19 1208      OT LONG TERM GOAL #1   Title  I with updated HEP.    Time  8    Period  Weeks    Status  New    Target Date  11/26/19      OT LONG TERM GOAL #2   Title  Pt will increase RUE grip strength to 35 lbs or greater for increased functional use(tested in traditional position)    Baseline  RUE 29.1, LUE 29.3    Time  8    Period  Weeks    Status  New      OT LONG TERM GOAL #3   Title  Pt will report pain no greater than 3/10 for ADLs/IADLs and simulated work activities.    Time  8    Period  Weeks    Status  New            Plan - 10/10/19 [id number]    Clinical Impression Statement  Pt is progressing towards goals. He demonstrates understanding of phase II stretches.    Occupational  performance deficits (Please refer to evaluation for details):  ADL's;IADL's;Work;Rest and Sleep    Body Structure / Function / Physical Skills  ADL;Mobility;Strength;Balance;Endurance;Flexibility;UE functional use;FMC;Coordination;GMC;Decreased knowledge of precautions;ROM;Decreased knowledge of use of DME;IADL;Dexterity    Rehab Potential  Good    OT Frequency  2x / week    OT Duration  8 weeks   or 16 visits total over 12 weeks   OT Treatment/Interventions  Self-care/ADL training;Therapeutic exercise;Splinting;Manual Therapy;Neuromuscular education;Ultrasound;Iontophoresis;Therapeutic activities;Cognitive remediation/compensation;Cryotherapy;Paraffin;DME and/or AE instruction;Fluidtherapy;Electrical Stimulation;Patient/family education;Passive range of motion;Contrast Bath;Moist Heat    Plan  continue Iontophoresis. Pt should have removed nitroglycerin patch the evening before    Consulted and Agree with Plan of Care  Patient       Patient will benefit from skilled therapeutic intervention in order to improve the following deficits and impairments:   Body Structure / Function / Physical Skills: ADL, Mobility, Strength, Balance, Endurance, Flexibility, UE functional use, FMC, Coordination, GMC, Decreased knowledge of precautions, ROM, Decreased knowledge of use of DME, IADL, Dexterity       Visit Diagnosis: Pain in right elbow  Muscle weakness (generalized)  Stiffness of right elbow, not elsewhere classified    Problem List Patient Active Problem List   Diagnosis Date Noted  . COVID-19 virus infection 03/15/2019  . Family history of colon cancer 12/08/2018  . Serrated polyp of colon 12/08/2018  . Gastroesophageal reflux disease without esophagitis 08/20/2018  . GAD (generalized anxiety disorder) 04/23/2018  . Seasonal affective disorder ([place]) 04/23/2018  . Overweight (BMI 25.0-29.9) 09/05/2017    Phillipa Morden 10/10/2019, 9:57 AM  [facility] [address] [place], [place], [address] Phone: [phone]   Fax:  [phone]  Name: Sael Matura MRN: [id number] Date of Birth: 09-03-1965

## 2019-10-13 ENCOUNTER — Telehealth: Payer: Self-pay | Admitting: Family Medicine

## 2019-10-13 NOTE — Telephone Encounter (Signed)
Pt would like to know if Dr. Georgina Snell would agree to extend his disability another month until he completes his therapy. It is helping and he does not want to take a "step back" in his progress.  Pt scheduled to return to work 4/6, I advised him he would not hear back from [place] until Dr. Clovis Riley return.

## 2019-10-17 ENCOUNTER — Encounter: Payer: Self-pay | Admitting: Family Medicine

## 2019-10-17 NOTE — Telephone Encounter (Signed)
Pt wife informed, she will check MyChart for letter and contact employer for additional paperwork if needed.

## 2019-10-17 NOTE — Telephone Encounter (Signed)
Letter written extending leave until May 6th. Advised patient to contact his employer to send new FMLA/short-term disability paperwork to me. Letter sent through my chart.  A copy of the letter can be picked up at the front desk today.  Ellard Artis

## 2019-10-18 ENCOUNTER — Other Ambulatory Visit: Payer: Self-pay

## 2019-10-18 ENCOUNTER — Ambulatory Visit: Payer: Commercial Managed Care - PPO | Attending: Family Medicine | Admitting: Occupational Therapy

## 2019-10-18 DIAGNOSIS — M6281 Muscle weakness (generalized): Secondary | ICD-10-CM | POA: Insufficient documentation

## 2019-10-18 DIAGNOSIS — M25621 Stiffness of right elbow, not elsewhere classified: Secondary | ICD-10-CM | POA: Insufficient documentation

## 2019-10-18 DIAGNOSIS — M25521 Pain in right elbow: Secondary | ICD-10-CM | POA: Insufficient documentation

## 2019-10-18 NOTE — Therapy (Addendum)
[facility] [address] [place], [place], [address] Phone: [phone]   Fax:  [phone]  Occupational Therapy Treatment  Patient Details  Name: Jacob Foster MRN: [id number] Date of Birth: [birth year] Referring Provider (OT): Dr. Sherene Sires   Encounter Date: 10/18/2019  OT End of Session - 10/18/19 1208    Visit Number  6    Number of Visits  17    Date for OT Re-Evaluation  11/26/19    Authorization Type  UMR    OT Start Time  0720    OT Stop Time  0802    OT Time Calculation (min)  42 min    Activity Tolerance  Patient tolerated treatment well    Behavior During Therapy  [facility] for tasks assessed/performed       Past Medical History:  Diagnosis Date  . Abscess of axilla   . Arthritis   . Depression    seasonal affective D/O   . Family history of colon cancer 12/08/2018   Father and brother. Sees Dr. Enis Gash  . GERD (gastroesophageal reflux disease)   . Hidradenitis suppurativa   . History of chickenpox   . Hyperlipemia   . Medical history non-contributory   . Neck pain   . Serrated polyp of colon 12/08/2018    Past Surgical History:  Procedure Laterality Date  . ABSCESS DRAINAGE    . ELBOW SURGERY      There were no vitals filed for this visit.  Subjective Assessment - 10/18/19 1207    Subjective   Pt reports his elbow bothered him a little this weekend    Pertinent History  s/p surgery for R lateral epicondylitis 2018, arthritis and hyperlipidemia    Patient Stated Goals  get better    Currently in Pain?  Yes    Pain Score  5     Pain Location  Elbow    Pain Orientation  Right    Pain Descriptors / Indicators  Aching    Pain Type  Chronic pain    Pain Onset  More than a month ago    Pain Frequency  Intermittent    Aggravating Factors   cold weather    Pain Relieving Factors  [place], heat             Treatment: [place] 23mhz, 0.8 w/cm 2,20% x 8 mins to right lateral forearm.No adverse  reactions Reviewed phase II stretches, 10 reps each, min v.c for positioning. Pt was instructed not to use heat today. Iontophoresis treatment #4 with 4mg /mL dexamethasone (2.0 cc), intensity=1.8-2.0 with no adverse reactions to lateral elbow x22 min with set-up and initial adjustments.2 Water blister present noted at medication pad at end of treatment. Reviewed wear of elbow and wrist braces if pt is experiencing(pt brought them in today).               OT Short Term Goals - 09/29/19 1322      OT SHORT TERM GOAL #1   Title  I with initial HEP.    Time  4    Period  Weeks    Status  On-going    Target Date  10/27/19      OT SHORT TERM GOAL #2   Title  Pt will verbalize understanding of activity modification to minimize pain and splint / brace wear, prn.    Time  4    Period  Weeks    Status  On-going      OT  SHORT TERM GOAL #3   Title  Pt will demonstrate elbow flexion 125 in prep for funcitonal use    Time  4    Period  Weeks    Status  New      OT SHORT TERM GOAL #4   Title  Pt will demonstrate RUE forearm supination/ pronation 85 for increased ease with ADLs.    Time  4    Period  Weeks    Status  New        OT Long Term Goals - 09/27/19 1208      OT LONG TERM GOAL #1   Title  I with updated HEP.    Time  8    Period  Weeks    Status  New    Target Date  11/26/19      OT LONG TERM GOAL #2   Title  Pt will increase RUE grip strength to 35 lbs or greater for increased functional use(tested in traditional position)    Baseline  RUE 29.1, LUE 29.3    Time  8    Period  Weeks    Status  New      OT LONG TERM GOAL #3   Title  Pt will report pain no greater than 3/10 for ADLs/IADLs and simulated work activities.    Time  8    Period  Weeks    Status  New            Plan - 10/18/19 1209    Clinical Impression Statement  Pt is progressing towards goals. He reports pain was better after ionto last week, but with cold temps this morning he is  having more pain.    Occupational performance deficits (Please refer to evaluation for details):  ADL's;IADL's;Work;Rest and Sleep    Body Structure / Function / Physical Skills  ADL;Mobility;Strength;Balance;Endurance;Flexibility;UE functional use;FMC;Coordination;GMC;Decreased knowledge of precautions;ROM;Decreased knowledge of use of DME;IADL;Dexterity    Rehab Potential  Good    OT Frequency  2x / week    OT Duration  8 weeks   or 16 visits total over 12 weeks   OT Treatment/Interventions  Self-care/ADL training;Therapeutic exercise;Splinting;Manual Therapy;Neuromuscular education;Ultrasound;Iontophoresis;Therapeutic activities;Cognitive remediation/compensation;Cryotherapy;Paraffin;DME and/or AE instruction;Fluidtherapy;Electrical Stimulation;Patient/family education;Passive range of motion;Contrast Bath;Moist Heat    Plan  continue Iontophoresis #5. Pt should have removed nitroglycerin patch the evening before    Consulted and Agree with Plan of Care  Patient       Patient will benefit from skilled therapeutic intervention in order to improve the following deficits and impairments:   Body Structure / Function / Physical Skills: ADL, Mobility, Strength, Balance, Endurance, Flexibility, UE functional use, FMC, Coordination, GMC, Decreased knowledge of precautions, ROM, Decreased knowledge of use of DME, IADL, Dexterity       Visit Diagnosis: Pain in right elbow  Muscle weakness (generalized)  Stiffness of right elbow, not elsewhere classified    Problem List Patient Active Problem List   Diagnosis Date Noted  . COVID-19 virus infection 03/15/2019  . Family history of colon cancer 12/08/2018  . Serrated polyp of colon 12/08/2018  . Gastroesophageal reflux disease without esophagitis 08/20/2018  . GAD (generalized anxiety disorder) 04/23/2018  . Seasonal affective disorder ([place]) 04/23/2018  . Overweight (BMI 25.0-29.9) 09/05/2017    Jacob Foster 10/18/2019, 12:12 PM  [facility] [address] [place], [place], [address] Phone: [phone]   Fax:  [phone]  Name: Jacob Foster MRN: [id number] Date of Birth: March 23, 1966

## 2019-10-21 ENCOUNTER — Other Ambulatory Visit: Payer: Self-pay

## 2019-10-21 ENCOUNTER — Ambulatory Visit: Payer: Commercial Managed Care - PPO | Admitting: Occupational Therapy

## 2019-10-21 DIAGNOSIS — M25521 Pain in right elbow: Secondary | ICD-10-CM

## 2019-10-21 DIAGNOSIS — M6281 Muscle weakness (generalized): Secondary | ICD-10-CM

## 2019-10-21 DIAGNOSIS — M25621 Stiffness of right elbow, not elsewhere classified: Secondary | ICD-10-CM

## 2019-10-21 NOTE — Therapy (Signed)
[facility] [address] [place], [place], [address] Phone: [phone]   Fax:  [phone]  Occupational Therapy Treatment  Patient Details  Name: Jacob Foster MRN: [id number] Date of Birth: [birth year] Referring Provider (OT): Dr. Sherene Sires   Encounter Date: 10/21/2019  OT End of Session - 10/21/19 0757    Visit Number  7    Number of Visits  17    Date for OT Re-Evaluation  11/26/19    Authorization Type  UMR    OT Start Time  0718    OT Stop Time  0802    OT Time Calculation (min)  44 min    Activity Tolerance  Patient tolerated treatment well    Behavior During Therapy  [facility] for tasks assessed/performed       Past Medical History:  Diagnosis Date  . Abscess of axilla   . Arthritis   . Depression    seasonal affective D/O   . Family history of colon cancer 12/08/2018   Father and brother. Sees Dr. Enis Gash  . GERD (gastroesophageal reflux disease)   . Hidradenitis suppurativa   . History of chickenpox   . Hyperlipemia   . Medical history non-contributory   . Neck pain   . Serrated polyp of colon 12/08/2018    Past Surgical History:  Procedure Laterality Date  . ABSCESS DRAINAGE    . ELBOW SURGERY      There were no vitals filed for this visit.  Subjective Assessment - 10/21/19 0800    Subjective   Pt reports his elbow bothered him a little this weekend    Pertinent History  s/p surgery for R lateral epicondylitis 2018, arthritis and hyperlipidemia    Patient Stated Goals  get better    Currently in Pain?  Yes    Pain Score  5     Pain Location  Elbow    Pain Orientation  Right    Pain Descriptors / Indicators  Aching    Pain Type  Chronic pain    Pain Onset  More than a month ago    Pain Frequency  Intermittent    Aggravating Factors   malpositioning, overuse    Pain Relieving Factors  [place]                  Treatment: [place] 64mhz, 0.8 w/cm 2,20% x 8 mins to right lateral forearm.No  adverse reactions Reviewed phase II stretches, 10 reps each, min v.c for positioning. Pt was instructed not to use heat today. Iontophoresis treatment #5 with 4mg /mL dexamethasone (2.0 cc), intensity=1.8-2.0 with no adverse reactions to lateral elbow x22 min with set-up and initial adjustments.3 small  Water blister present noted at medication pad at end of treatment. Reviewed wear of elbow counterforce brace as pt was not applying tight enough to give counterforce. (Pt was made aware not to apply too tight that it impedes circulation.            OT Short Term Goals - 09/29/19 1322      OT SHORT TERM GOAL #1   Title  I with initial HEP.    Time  4    Period  Weeks    Status  On-going    Target Date  10/27/19      OT SHORT TERM GOAL #2   Title  Pt will verbalize understanding of activity modification to minimize pain and splint / brace wear, prn.    Time  4  Period  Weeks    Status  On-going      OT SHORT TERM GOAL #3   Title  Pt will demonstrate elbow flexion 125 in prep for funcitonal use    Time  4    Period  Weeks    Status  New      OT SHORT TERM GOAL #4   Title  Pt will demonstrate RUE forearm supination/ pronation 85 for increased ease with ADLs.    Time  4    Period  Weeks    Status  New        OT Long Term Goals - 09/27/19 1208      OT LONG TERM GOAL #1   Title  I with updated HEP.    Time  8    Period  Weeks    Status  New    Target Date  11/26/19      OT LONG TERM GOAL #2   Title  Pt will increase RUE grip strength to 35 lbs or greater for increased functional use(tested in traditional position)    Baseline  RUE 29.1, LUE 29.3    Time  8    Period  Weeks    Status  New      OT LONG TERM GOAL #3   Title  Pt will report pain no greater than 3/10 for ADLs/IADLs and simulated work activities.    Time  8    Period  Weeks    Status  New            Plan - 10/21/19 1546    Clinical Impression Statement  Pt is progressing towards goals. He  reports pain is better overall    Occupational performance deficits (Please refer to evaluation for details):  ADL's;IADL's;Work;Rest and Sleep    Body Structure / Function / Physical Skills  ADL;Mobility;Strength;Balance;Endurance;Flexibility;UE functional use;FMC;Coordination;GMC;Decreased knowledge of precautions;ROM;Decreased knowledge of use of DME;IADL;Dexterity    Rehab Potential  Good    OT Frequency  2x / week    OT Duration  8 weeks   or 16 visits total over 12 weeks   OT Treatment/Interventions  Self-care/ADL training;Therapeutic exercise;Splinting;Manual Therapy;Neuromuscular education;Ultrasound;Iontophoresis;Therapeutic activities;Cognitive remediation/compensation;Cryotherapy;Paraffin;DME and/or AE instruction;Fluidtherapy;Electrical Stimulation;Patient/family education;Passive range of motion;Contrast Bath;Moist Heat    Plan  continue Iontophoresis #6. Pt should have removed nitroglycerin patch the evening before    Consulted and Agree with Plan of Care  Patient       Patient will benefit from skilled therapeutic intervention in order to improve the following deficits and impairments:   Body Structure / Function / Physical Skills: ADL, Mobility, Strength, Balance, Endurance, Flexibility, UE functional use, FMC, Coordination, GMC, Decreased knowledge of precautions, ROM, Decreased knowledge of use of DME, IADL, Dexterity       Visit Diagnosis: Pain in right elbow  Muscle weakness (generalized)  Stiffness of right elbow, not elsewhere classified    Problem List Patient Active Problem List   Diagnosis Date Noted  . COVID-19 virus infection 03/15/2019  . Family history of colon cancer 12/08/2018  . Serrated polyp of colon 12/08/2018  . Gastroesophageal reflux disease without esophagitis 08/20/2018  . GAD (generalized anxiety disorder) 04/23/2018  . Seasonal affective disorder ([place]) 04/23/2018  . Overweight (BMI 25.0-29.9) 09/05/2017    Deadra Diggins 10/21/2019, 3:46  PM  [facility] [address] [place], [place], [address] Phone: [phone]   Fax:  [phone]  Name: Jacob Foster MRN: [id number] Date of Birth: 08/27/1965

## 2019-10-25 ENCOUNTER — Other Ambulatory Visit: Payer: Self-pay

## 2019-10-25 ENCOUNTER — Ambulatory Visit: Payer: Commercial Managed Care - PPO | Admitting: Occupational Therapy

## 2019-10-25 ENCOUNTER — Encounter: Payer: Self-pay | Admitting: Occupational Therapy

## 2019-10-25 DIAGNOSIS — M25521 Pain in right elbow: Secondary | ICD-10-CM

## 2019-10-25 DIAGNOSIS — M6281 Muscle weakness (generalized): Secondary | ICD-10-CM

## 2019-10-25 DIAGNOSIS — M25621 Stiffness of right elbow, not elsewhere classified: Secondary | ICD-10-CM

## 2019-10-25 NOTE — Therapy (Addendum)
[facility] [address] [place], [place], [address] Phone: [phone]   Fax:  [phone]  Occupational Therapy Treatment  Patient Details  Name: Jacob Foster MRN: [id number] Date of Birth: September 30, 1965 Referring Provider (OT): Dr. Sherene Sires   Encounter Date: 10/25/2019  OT End of Session - 10/25/19 0759    Visit Number  8    Number of Visits  17    Date for OT Re-Evaluation  11/26/19    Authorization Type  UMR    OT Start Time  0720    OT Stop Time  0802    OT Time Calculation (min)  42 min    Activity Tolerance  Patient tolerated treatment well    Behavior During Therapy  [facility] for tasks assessed/performed       Past Medical History:  Diagnosis Date  . Abscess of axilla   . Arthritis   . Depression    seasonal affective D/O   . Family history of colon cancer 12/08/2018   Father and brother. Sees Dr. Enis Gash  . GERD (gastroesophageal reflux disease)   . Hidradenitis suppurativa   . History of chickenpox   . Hyperlipemia   . Medical history non-contributory   . Neck pain   . Serrated polyp of colon 12/08/2018    Past Surgical History:  Procedure Laterality Date  . ABSCESS DRAINAGE    . ELBOW SURGERY      There were no vitals filed for this visit.  Subjective Assessment - 10/25/19 0757    Subjective   Pt reports his pain is better    Pertinent History  s/p surgery for R lateral epicondylitis 2018, arthritis and hyperlipidemia    Patient Stated Goals  get better    Currently in Pain?  Yes    Pain Score  2     Pain Location  Elbow    Pain Orientation  Right    Pain Descriptors / Indicators  Aching    Pain Type  Acute pain    Pain Onset  More than a month ago    Pain Frequency  Intermittent    Aggravating Factors   overuse    Pain Relieving Factors  [place]              Treatment: [place] 9mhz, 0.8 w/cm 2, 20% x 8 mins to right lateral forearm.No adverse reactions Reviewed phase II stretches, 10  reps each, Pt was instructed not to use heat today. Iontophoresis treatment #6 with 4mg /mL dexamethasone (2.0 cc), intensity=1.8-2.0 with no adverse reactions to lateral elbow x22 min with set-up and initial adjustments.  2 small Water blister present noted at medication pad at end of treatment. Reviewed wear of elbow counterforce brace, pt verbalizes understanding.               OT Short Term Goals - 09/29/19 1322      OT SHORT TERM GOAL #1   Title  I with initial HEP.    Time  4    Period  Weeks    Status  On-going    Target Date  10/27/19      OT SHORT TERM GOAL #2   Title  Pt will verbalize understanding of activity modification to minimize pain and splint / brace wear, prn.    Time  4    Period  Weeks    Status  On-going      OT SHORT TERM GOAL #3   Title  Pt will demonstrate  elbow flexion 125 in prep for funcitonal use    Time  4    Period  Weeks    Status  New      OT SHORT TERM GOAL #4   Title  Pt will demonstrate RUE forearm supination/ pronation 85 for increased ease with ADLs.    Time  4    Period  Weeks    Status  New        OT Long Term Goals - 09/27/19 1208      OT LONG TERM GOAL #1   Title  I with updated HEP.    Time  8    Period  Weeks    Status  New    Target Date  11/26/19      OT LONG TERM GOAL #2   Title  Pt will increase RUE grip strength to 35 lbs or greater for increased functional use(tested in traditional position)    Baseline  RUE 29.1, LUE 29.3    Time  8    Period  Weeks    Status  New      OT LONG TERM GOAL #3   Title  Pt will report pain no greater than 3/10 for ADLs/IADLs and simulated work activities.    Time  8    Period  Weeks    Status  New              Plan - 10/28/19 1138    Clinical Impression Statement  Pt is progressing towards goals. He reports pain is better overall. Pt completed his ionto treatments today.    Occupational performance deficits (Please refer to evaluation for details):   ADL's;IADL's;Work;Rest and Sleep    Body Structure / Function / Physical Skills  ADL;Mobility;Strength;Balance;Endurance;Flexibility;UE functional use;FMC;Coordination;GMC;Decreased knowledge of precautions;ROM;Decreased knowledge of use of DME;IADL;Dexterity    Rehab Potential  Good    OT Frequency  2x / week    OT Duration  8 weeks   or 16 visits total over 12 weeks   OT Treatment/Interventions  Self-care/ADL training;Therapeutic exercise;Splinting;Manual Therapy;Neuromuscular education;Ultrasound;Iontophoresis;Therapeutic activities;Cognitive remediation/compensation;Cryotherapy;Paraffin;DME and/or AE instruction;Fluidtherapy;Electrical Stimulation;Patient/family education;Passive range of motion;Contrast Bath;Moist Heat    Plan  continue [place], progress HEP    Consulted and Agree with Plan of Care  Patient       Patient will benefit from skilled therapeutic intervention in order to improve the following deficits and impairments:  Pain strength, ROM RUE functional use         Visit Diagnosis: Pain in right elbow  Muscle weakness (generalized)  Stiffness of right elbow, not elsewhere classified    Problem List Patient Active Problem List   Diagnosis Date Noted  . COVID-19 virus infection 03/15/2019  . Family history of colon cancer 12/08/2018  . Serrated polyp of colon 12/08/2018  . Gastroesophageal reflux disease without esophagitis 08/20/2018  . GAD (generalized anxiety disorder) 04/23/2018  . Seasonal affective disorder ([place]) 04/23/2018  . Overweight (BMI 25.0-29.9) 09/05/2017    Argenis Kumari 10/25/2019, 7:59 AM [facility] [address] [place], [place], [address] Phone: [phone]   Fax:  [phone]  Name: Jacob Foster MRN: [id number] Date of Birth: 10/12/1965

## 2019-10-26 ENCOUNTER — Telehealth: Payer: Self-pay | Admitting: Family Medicine

## 2019-10-26 NOTE — Telephone Encounter (Signed)
Received disability and leave healthcare provider statement from [place]. It has been over a month since I saw him last.  Called and left a message for patient to let me know his work situation.  Recommend follow-up visit in person to clarify work situation however certainly would be happy to talk to him on the phone about return to work and how to fill this form out correctly.  Please attempt to contact patient again.

## 2019-10-26 NOTE — Telephone Encounter (Signed)
Patient called back.  He has improved but not fully.  Recommend follow-up visit in person prior to deadline of form on May 10.  He agrees and will schedule follow-up appointment.

## 2019-10-28 ENCOUNTER — Encounter: Payer: Self-pay | Admitting: Occupational Therapy

## 2019-10-28 ENCOUNTER — Ambulatory Visit: Payer: Commercial Managed Care - PPO | Admitting: Occupational Therapy

## 2019-10-28 ENCOUNTER — Other Ambulatory Visit: Payer: Self-pay

## 2019-10-28 DIAGNOSIS — M25621 Stiffness of right elbow, not elsewhere classified: Secondary | ICD-10-CM

## 2019-10-28 DIAGNOSIS — M25521 Pain in right elbow: Secondary | ICD-10-CM

## 2019-10-28 DIAGNOSIS — M6281 Muscle weakness (generalized): Secondary | ICD-10-CM

## 2019-10-28 NOTE — Therapy (Addendum)
[facility] [address] [place], [place], [address] Phone: [phone]   Fax:  [phone]  Occupational Therapy Treatment  Patient Details  Name: Jacob Foster MRN: [id number] Date of Birth: Dec 09, 1965 Referring Provider (OT): Dr. Sherene Sires   Encounter Date: 10/28/2019  OT End of Session - 10/28/19 0816    Visit Number  9    Number of Visits  17    Date for OT Re-Evaluation  11/26/19    Authorization Type  UMR    OT Start Time  0722    OT Stop Time  0808    OT Time Calculation (min)  46 min    Activity Tolerance  Patient tolerated treatment well    Behavior During Therapy  [facility] for tasks assessed/performed       Past Medical History:  Diagnosis Date  . Abscess of axilla   . Arthritis   . Depression    seasonal affective D/O   . Family history of colon cancer 12/08/2018   Father and brother. Sees Dr. Enis Gash  . GERD (gastroesophageal reflux disease)   . Hidradenitis suppurativa   . History of chickenpox   . Hyperlipemia   . Medical history non-contributory   . Neck pain   . Serrated polyp of colon 12/08/2018    Past Surgical History:  Procedure Laterality Date  . ABSCESS DRAINAGE    . ELBOW SURGERY      There were no vitals filed for this visit.  Subjective Assessment - 10/28/19 0823    Subjective   Pt reports his pain is worse today, he thinks it is related to the colder temps    Pertinent History  s/p surgery for R lateral epicondylitis 2018, arthritis and hyperlipidemia    Patient Stated Goals  get better    Currently in Pain?  Yes    Pain Score  6     Pain Location  Elbow    Pain Orientation  Right    Pain Descriptors / Indicators  Aching    Pain Type  Chronic pain    Pain Onset  More than a month ago    Pain Frequency  Intermittent    Aggravating Factors   cold temps    Pain Relieving Factors  heat              Treatment:Pt arrived reporting his pain is worse today. [place] 27mhz,  0.8 w/cm 2 continuous x 8 mins, no adverse reactions. Massage and soft tissue mobs to right lateral forearm, followed by phase II exercise # 4, and then pt progressed to phase III stretch #1 within tolerated ROM, 10 reps each. Pt was instructed to continue with phase II exercises at home, and to add exercise #1 from phase III exercises for home if his pain is not increased. Therapist reviewed with pt that he should use heat for 10 mins prior to exercises, he should resume massage to left lateral elbow and he may resume use of nitroglycerin patches as prescribed by MD (but he should not apply heat while wearing patch.) Hot pack applied today at end of session  X 8 mins (instead of ice) as cold temps appear to be aggravating pain. No adverse reactions. Pt was supplied with a tensogrip sleeve to provide gentle compression and warmth to RUE at end of session. Therapist to send in basket message to MD.              OT Short Term Goals - 10/25/19  0824      OT SHORT TERM GOAL #1   Title  I with initial HEP.    Time  4    Period  Weeks    Status  On-going    Target Date  10/27/19      OT SHORT TERM GOAL #2   Title  Pt will verbalize understanding of activity modification to minimize pain and splint / brace wear, prn.    Time  4    Period  Weeks    Status  On-going      OT SHORT TERM GOAL #3   Title  Pt will demonstrate elbow flexion 125 in prep for funcitonal use    Time  4    Period  Weeks    Status  New      OT SHORT TERM GOAL #4   Title  Pt will demonstrate RUE forearm supination/ pronation 85 for increased ease with ADLs.    Time  4    Period  Weeks    Status  New        OT Long Term Goals - 09/27/19 1208      OT LONG TERM GOAL #1   Title  I with updated HEP.    Time  8    Period  Weeks    Status  New    Target Date  11/26/19      OT LONG TERM GOAL #2   Title  Pt will increase RUE grip strength to 35 lbs or greater for increased functional use(tested in  traditional position)    Baseline  RUE 29.1, LUE 29.3    Time  8    Period  Weeks    Status  New      OT LONG TERM GOAL #3   Title  Pt will report pain no greater than 3/10 for ADLs/IADLs and simulated work activities.    Time  8    Period  Weeks    Status  New            Plan - 10/28/19 0817    Clinical Impression Statement  Pt arrived today reporting increased pain at 6/10. Pt relates  pain to colder temps this a.m. Pt reports he has been weraing counterforce braces and wrist brace at home.Pt reports he sees MD next week.    Occupational performance deficits (Please refer to evaluation for details):  ADL's;IADL's;Work;Rest and Sleep    Body Structure / Function / Physical Skills  ADL;Mobility;Strength;Balance;Endurance;Flexibility;UE functional use;FMC;Coordination;GMC;Decreased knowledge of precautions;ROM;Decreased knowledge of use of DME;IADL;Dexterity    Rehab Potential  Good    OT Frequency  2x / week    OT Duration  8 weeks   or 16 visits total over 12 weeks   OT Treatment/Interventions  Self-care/ADL training;Therapeutic exercise;Splinting;Manual Therapy;Neuromuscular education;Ultrasound;Iontophoresis;Therapeutic activities;Cognitive remediation/compensation;Cryotherapy;Paraffin;DME and/or AE instruction;Fluidtherapy;Electrical Stimulation;Patient/family education;Passive range of motion;Contrast Bath;Moist Heat    Plan  proceed per MD recommendations, Phase III stretches as tolerated.(exercise 1 issued today.    Consulted and Agree with Plan of Care  Patient       Patient will benefit from skilled therapeutic intervention in order to improve the following deficits and impairments:   Body Structure / Function / Physical Skills: ADL, Mobility, Strength, Balance, Endurance, Flexibility, UE functional use, FMC, Coordination, GMC, Decreased knowledge of precautions, ROM, Decreased knowledge of use of DME, IADL, Dexterity       Visit Diagnosis: Pain in right  elbow  Muscle weakness (generalized)  Stiffness of right  elbow, not elsewhere classified    Problem List Patient Active Problem List   Diagnosis Date Noted  . COVID-19 virus infection 03/15/2019  . Family history of colon cancer 12/08/2018  . Serrated polyp of colon 12/08/2018  . Gastroesophageal reflux disease without esophagitis 08/20/2018  . GAD (generalized anxiety disorder) 04/23/2018  . Seasonal affective disorder ([place]) 04/23/2018  . Overweight (BMI 25.0-29.9) 09/05/2017    Earlean Fidalgo 10/28/2019, 8:24 AM  [facility] [address] [place], [place], [address] Phone: [phone]   Fax:  [phone]  Name: Jacob Foster MRN: [id number] Date of Birth: June 09, 1966

## 2019-10-31 ENCOUNTER — Encounter: Payer: Self-pay | Admitting: Family Medicine

## 2019-10-31 ENCOUNTER — Other Ambulatory Visit: Payer: Self-pay

## 2019-10-31 ENCOUNTER — Ambulatory Visit (INDEPENDENT_AMBULATORY_CARE_PROVIDER_SITE_OTHER): Payer: Commercial Managed Care - PPO

## 2019-10-31 ENCOUNTER — Ambulatory Visit: Payer: Commercial Managed Care - PPO | Admitting: Family Medicine

## 2019-10-31 VITALS — BP 132/86 | HR 98 | Ht 68.0 in | Wt 190.0 lb

## 2019-10-31 DIAGNOSIS — M25521 Pain in right elbow: Secondary | ICD-10-CM

## 2019-10-31 DIAGNOSIS — M7711 Lateral epicondylitis, right elbow: Secondary | ICD-10-CM | POA: Diagnosis not present

## 2019-10-31 NOTE — Patient Instructions (Signed)
Thank you for coming in today. Plan for xray today and MRI soon to plan for surgery or PRP injection.  Recheck after MRI.  You should hear about MRI scheduling in a few day or sooner.  Let me know if you have not hear about MRI by the end of the week.

## 2019-10-31 NOTE — Progress Notes (Signed)
Jacob Foster is a 54 y.o. male who presents to [facility] [facility] today for R elbow pain. Patient was last seen by Dr. Georgina Snell on 09/20/2019 and reported no change in pain was using a whole patch instead of a 1/4 which was causing headaches. Patient was informed to start OT and to use 1/4 patch. Since last visit been to OT and reports after therapy helps temporarily and patches have been better since cutting them, but stopped while doing OT. Noticed the pain worse with the cooler weather and lifting things the wrong way it will hurt.  Right elbow much worse than left.  Patient unable to work at [employer] due to elbow pain.  At this point he has had 9 sessions of occupational therapy with only mild improvement.  He has 6 more sessions scheduled ending on May 7.   Pertinent review of systems: No fevers or chills  Relevant historical information: History anxiety disorder and GERD.   Exam:  BP 132/86 (BP Location: Left Arm, Patient Position: Sitting, Cuff Size: Large)   Pulse 98   Ht 5\' 8"  (1.727 m)   Wt 190 lb (86.2 kg)   SpO2 98%   BMI 28.89 kg/m  General: Well Developed, well nourished, and in no acute distress.   MSK: Right elbow normal-appearing tender palpation lateral epicondyle. Normal elbow motion. Elbow strength intact. Pain with resisted wrist and finger extension.    Lab and Radiology Results X-ray images right elbow obtained today personally and independently reviewed Small osteophyte present at lateral elbow.  No acute fractures.  Minimal degenerative changes present. Await formal radiology review   Diagnostic Limited MSK Ultrasound of: Elbow Lateral epicondyle visualized.  Small avulsion fragment at tip of lateral epicondyle. Insertion of common extensor tendon present without obvious tear. Impression: Lateral epicondylitis    Assessment and Plan: 54 y.o. male with right elbow pain ongoing for months.  Patient has had extensive  conservative management at this point with lots of occupational therapy and trials of conservative management.  He notes in the past he had steroid injections for this with little benefit.  I am not optimistic about the next 6 occupational therapy sessions getting him pain-free enough that he can work.  We discussed Management strategies.  Doubtful that steroid injection will provide lasting benefit and has it has not worked very well in the past for him. Next steps are really going to be surgery or PRP.  Plan for x-ray and MRI today for surgical or PRP planning.  Follow-up after MRI.   PDMP not reviewed this encounter. Orders Placed This Encounter  Procedures  . [place] LIMITED JOINT SPACE STRUCTURES UP RIGHT    Standing Status:   Future    Number of Occurrences:   1    Standing Expiration Date:   12/30/2020    Order Specific Question:   Reason for Exam (SYMPTOM  OR DIAGNOSIS REQUIRED)    Answer:   Right elbow pain    Order Specific Question:   Preferred imaging location?    Answer:   [facility]  . DG ELBOW COMPLETE RIGHT (3+VIEW)    Standing Status:   Future    Number of Occurrences:   1    Standing Expiration Date:   12/30/2020    Order Specific Question:   Reason for Exam (SYMPTOM  OR DIAGNOSIS REQUIRED)    Answer:   eval right lateral epicondylitis and plan for MRI    Order  Specific Question:   Preferred imaging location?    Answer:   [facility]    Order Specific Question:   Radiology Contrast Protocol - do NOT remove file path    Answer:   \\[email or web address]  . MR ELBOW RIGHT WO CONTRAST    Standing Status:   Future    Standing Expiration Date:   12/30/2020    Order Specific Question:   What is the patient's sedation requirement?    Answer:   No Sedation    Order Specific Question:   Does the patient have a pacemaker or implanted devices?    Answer:   No    Order Specific Question:   Preferred imaging location?     Answer:   [facility] (table limit - 550lbs)    Order Specific Question:   Radiology Contrast Protocol - do NOT remove file path    Answer:   \\[email or web address]   No orders of the defined types were placed in this encounter.    Discussed warning signs or symptoms. Please see discharge instructions. Patient expresses understanding.   The above documentation has been reviewed and is accurate and complete Lynne Leader

## 2019-11-01 ENCOUNTER — Ambulatory Visit: Payer: Commercial Managed Care - PPO | Admitting: Occupational Therapy

## 2019-11-01 NOTE — Progress Notes (Signed)
Xray elbow shows no fracture. MRI will be helpful.

## 2019-11-04 ENCOUNTER — Ambulatory Visit: Payer: Commercial Managed Care - PPO | Admitting: Occupational Therapy

## 2019-11-08 ENCOUNTER — Encounter: Payer: Commercial Managed Care - PPO | Admitting: Occupational Therapy

## 2019-11-11 ENCOUNTER — Encounter: Payer: Commercial Managed Care - PPO | Admitting: Occupational Therapy

## 2019-11-15 ENCOUNTER — Encounter: Payer: Commercial Managed Care - PPO | Admitting: Occupational Therapy

## 2019-11-16 ENCOUNTER — Telehealth: Payer: Self-pay | Admitting: Family Medicine

## 2019-11-16 ENCOUNTER — Encounter: Payer: Self-pay | Admitting: Family Medicine

## 2019-11-16 NOTE — Telephone Encounter (Signed)
Letter printed and available at the front desk.  Copy of the letter was also sent via [place].  Schedule follow-up appoint with me on the afternoon of the 19th or later to review MRI.

## 2019-11-16 NOTE — Telephone Encounter (Signed)
Patient's wife called stating that he is supposed to return to work tomorrow but his MRI is not scheduled until 11/29/2019 (this was the first available opening). She asked if his absence from work could be extended until after the MRI is done and there is a plan in place on what he will need to do for treatment?  Please advise  Patient's wife can be reached at [phone]

## 2019-11-16 NOTE — Telephone Encounter (Signed)
Returned call to pt's wife and informed her that pt's letter has been sent to MyChart and is also available for pick-up if so desired.  Scheduled pt for f/u visit to review R elbow MRI on 11/30/19.

## 2019-11-18 ENCOUNTER — Encounter: Payer: Commercial Managed Care - PPO | Admitting: Occupational Therapy

## 2019-11-24 ENCOUNTER — Other Ambulatory Visit: Payer: Self-pay | Admitting: Family Medicine

## 2019-11-28 ENCOUNTER — Other Ambulatory Visit: Payer: Self-pay | Admitting: Family Medicine

## 2019-11-29 ENCOUNTER — Other Ambulatory Visit: Payer: Commercial Managed Care - PPO

## 2019-11-29 NOTE — Addendum Note (Signed)
Addended by: Pollyann Glen on: 11/29/2019 08:16 AM   Modules accepted: Orders

## 2019-11-30 ENCOUNTER — Ambulatory Visit: Payer: Commercial Managed Care - PPO | Admitting: Family Medicine

## 2019-12-04 ENCOUNTER — Other Ambulatory Visit: Payer: Self-pay

## 2019-12-04 ENCOUNTER — Ambulatory Visit (INDEPENDENT_AMBULATORY_CARE_PROVIDER_SITE_OTHER): Payer: Commercial Managed Care - PPO

## 2019-12-04 DIAGNOSIS — M25521 Pain in right elbow: Secondary | ICD-10-CM | POA: Diagnosis not present

## 2019-12-06 ENCOUNTER — Encounter: Payer: Self-pay | Admitting: Physician Assistant

## 2019-12-06 ENCOUNTER — Ambulatory Visit: Payer: Commercial Managed Care - PPO | Admitting: Family Medicine

## 2019-12-06 ENCOUNTER — Other Ambulatory Visit: Payer: Self-pay

## 2019-12-06 ENCOUNTER — Encounter: Payer: Self-pay | Admitting: Family Medicine

## 2019-12-06 ENCOUNTER — Ambulatory Visit: Payer: Commercial Managed Care - PPO | Admitting: Physician Assistant

## 2019-12-06 ENCOUNTER — Telehealth: Payer: Self-pay | Admitting: Family Medicine

## 2019-12-06 VITALS — BP 128/80 | HR 84 | Temp 98.0°F | Resp 16 | Ht 68.0 in | Wt 185.0 lb

## 2019-12-06 VITALS — BP 140/90 | HR 94 | Ht 68.0 in | Wt 190.0 lb

## 2019-12-06 DIAGNOSIS — I10 Essential (primary) hypertension: Secondary | ICD-10-CM | POA: Diagnosis not present

## 2019-12-06 DIAGNOSIS — M7711 Lateral epicondylitis, right elbow: Secondary | ICD-10-CM | POA: Diagnosis not present

## 2019-12-06 DIAGNOSIS — R39198 Other difficulties with micturition: Secondary | ICD-10-CM | POA: Diagnosis not present

## 2019-12-06 LAB — POCT URINALYSIS DIPSTICK
Bilirubin, UA: NEGATIVE
Blood, UA: NEGATIVE
Glucose, UA: NEGATIVE
Ketones, UA: NEGATIVE
Leukocytes, UA: NEGATIVE
Nitrite, UA: NEGATIVE
Protein, UA: NEGATIVE
Spec Grav, UA: 1.025 (ref 1.010–1.025)
Urobilinogen, UA: 1 E.U./dL
pH, UA: 6 (ref 5.0–8.0)

## 2019-12-06 LAB — PSA: PSA: 0.74 ng/mL (ref 0.10–4.00)

## 2019-12-06 NOTE — Progress Notes (Signed)
Rito Ehrlich, am serving as a Education administrator for Dr. Lynne Leader.  Jacob Foster is a 54 y.o. male who presents to [place] [facility] today for f/u of his R elbow pain and R elbow MRI review.  He was last seen by Dr. Georgina Snell on 10/31/19 and was referred for a R elbow MRI.  He has tried nitroglycerin patches, has been provided a HEP focusing on wrist extensor eccentrics and has completed 9 OT sessions w/ no overall improvement noted.  He has a hx of a prior surgery for R lateral epicondylitis in 2018.  Since his last visit, pt reports that his R elbow is still hurting worse still when trying to lift things.   Diagnostic imaging: R elbow XR- 10/31/19 and R elbow MRI- 12/04/19    Pertinent review of systems: No fevers or chills  Relevant historical information: History of prior lateral epicondyle surgery   Exam:  BP (!) 150/100 (BP Location: Left Arm, Patient Position: Sitting, Cuff Size: Normal)   Pulse 94   Ht 5\' 8"  (1.727 m)   Wt 190 lb (86.2 kg)   SpO2 98%   BMI 28.89 kg/m  General: Well Developed, well nourished, and in no acute distress.   MSK: Right elbow mature scar overlying lateral epicondyle.  Mildly tender palpation right lateral epicondyle.  Nontender medial epicondyle cubital tunnel. Normal elbow motion and strength. Normal strength to resisted wrist and finger extension however some pain with this activity felt at the lateral epicondyle.    Lab and Radiology Results No results found for this or any previous visit (from the past 72 hour(s)). MR ELBOW RIGHT WO CONTRAST  Result Date: 12/05/2019 CLINICAL DATA:  Chronic posterior right elbow pain for 3 years. Elbow surgery approximately 3 years ago. EXAM: MRI OF THE RIGHT ELBOW WITHOUT CONTRAST TECHNIQUE: Multiplanar, multisequence MR imaging of the elbow was performed. No intravenous contrast was administered. COMPARISON:  Radiographs dated 10/31/2019 FINDINGS: TENDONS Common forearm flexor origin: Normal.  Common forearm extensor origin: Slight degenerative hypertrophy. Slight intrinsic degeneration adjacent to 2 tiny surgical anchors consistent with previous repair. Otherwise normal. Biceps: Normal. Triceps: Normal. LIGAMENTS Medial stabilizers: Intact. Lateral stabilizers:  Intact. Cartilage: Normal. Joint: Normal. Cubital tunnel: Slight increased signal from the ulnar nerve in the cubital tunnel. The nerve is not enlarged. Nerve is not dislocated. No mass lesion in that area. Bones: Normal. IMPRESSION: 1. Slight increased signal from the ulnar nerve in the cubital tunnel which could represent focal mild ulnar neuritis. 2. Slight degenerative hypertrophy of the common extensor tendon origin with evidence of prior surgery. 3. Otherwise, normal exam. Electronically Signed   By: Lorriane Shire M.D.   On: 12/05/2019 08:52   I, Lynne Leader, personally (independently) visualized and performed the interpretation of the images attached in this note.     Assessment and Plan: 54 y.o. male with right lateral elbow pain.  MRI does show irritation and inflammation at common extensor tendon origin consistent with lateral epicondylitis.  He has improved with conservative management but not completely.  I think it is reasonable with the above MRI findings that he has a trial of return to work.  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  Additionally will proceed with PRP trial.  Patient will schedule that in the near future.  We will complete disability paperwork for patient.  Return to work trial 12/12/2019   Discussed warning signs or symptoms. Please see discharge instructions.  Patient expresses understanding.   The above documentation has been reviewed and is accurate and complete Lynne Leader, M.D.

## 2019-12-06 NOTE — Patient Instructions (Signed)
Thank you for coming in today. Schedule PRP injection.  Well plan attempt return to work on 5/31.  Try using wrist brace at work and a sander with a handle on it to change positron.  Do the exercises as much as possible.

## 2019-12-06 NOTE — Progress Notes (Signed)
Patient presents to clinic today as a walk-in for reassessment of BP. Was at his Sports Medicine provider today for his chronic elbow and shoulder pain. BP at that time noted to be elevated at 150/100. On recheck down to 140/90. States he checked at a machine at [employer] and was at 154/95. Came here for further assessment. Patient denies chest pain, palpitations, lightheadedness, dizziness, vision changes or frequent headaches. Notes significant increase in use of NSAIDs due to his pain. Notes diet higher in salt, especially recently.   Patient also notes having recent issue with some urinary hesitancy and decreased urinary stream. Denies frequency, dysuria, hematuria. Denies nocturia. Denies change to bowel habits.   Past Medical History:  Diagnosis Date  . Abscess of axilla   . Arthritis   . Depression    seasonal affective D/O   . Family history of colon cancer 12/08/2018   Father and brother. Sees Dr. Enis Gash  . GERD (gastroesophageal reflux disease)   . Hidradenitis suppurativa   . History of chickenpox   . Hyperlipemia   . Medical history non-contributory   . Neck pain   . Serrated polyp of colon 12/08/2018    Current Outpatient Medications on File Prior to Visit  Medication Sig Dispense Refill  . albuterol (VENTOLIN HFA) 108 (90 Base) MCG/ACT inhaler Inhale 2 puffs into the lungs every 6 (six) hours as needed for wheezing or shortness of breath. (Patient not taking: Reported on 12/06/2019) 6.7 g 0  . amLODipine (NORVASC) 10 MG tablet Take 1 tablet (10 mg total) by mouth daily. (Patient not taking: Reported on 12/06/2019) 30 tablet 0  . carvedilol (COREG) 6.25 MG tablet Take 1 tablet by mouth 2 (two) times daily.    . hydrALAZINE (APRESOLINE) 50 MG tablet Take 1 tablet (50 mg total) by mouth 3 (three) times daily. (Patient not taking: Reported on 12/06/2019) 90 tablet 0  . nitroGLYCERIN (NITRODUR - DOSED IN MG/24 HR) 0.2 mg/hr patch Apply 1/4 patch daily to tendon for tendonitis.  (Patient not taking: Reported on 12/06/2019) 30 patch 1   No current facility-administered medications on file prior to visit.    No Known Allergies  Family History  Problem Relation Age of Onset  . Hypertension Mother   . Hyperlipidemia Mother   . Breast cancer Mother   . Prostate cancer Father   . Kidney disease Father   . Colon cancer Father        in his 56's  . Throat cancer Brother        dies age 55 - alcoholic   . Hypertension Brother   . Esophageal cancer Brother   . Healthy Daughter   . Healthy Son   . Healthy Son   . Healthy Son   . Diabetes Brother   . Colon cancer Brother 80  . Colon cancer Brother 103  . Colon cancer Maternal Uncle   . Colon cancer Maternal Uncle   . Colon polyps Neg Hx   . Stomach cancer Neg Hx   . Rectal cancer Neg Hx     Social History   Socioeconomic History  . Marital status: Married    Spouse name: Edsel Petrin  . Number of children: 4  . Years of education: Not on file  . Highest education level: Not on file  Occupational History  . Occupation: Magazine features editor: [employer]  Tobacco Use  . Smoking status: Never Smoker  . Smokeless tobacco: Never Used  Substance and Sexual  Activity  . Alcohol use: No  . Drug use: No  . Sexual activity: Yes  Other Topics Concern  . Not on file  Social History Narrative   Originally from [place] Strain:   . Difficulty of Paying Living Expenses:   Food Insecurity:   . Worried About Charity fundraiser in the Last Year:   . Arboriculturist in the Last Year:   Transportation Needs:   . Film/video editor (Medical):   Marland Kitchen Lack of Transportation (Non-Medical):   Physical Activity:   . Days of Exercise per Week:   . Minutes of Exercise per Session:   Stress:   . Feeling of Stress :   Social Connections:   . Frequency of Communication with Friends and Family:   . Frequency of Social Gatherings with Friends and Family:   .  Attends Religious Services:   . Active Member of Clubs or Organizations:   . Attends Archivist Meetings:   Marland Kitchen Marital Status:    Review of Systems - See HPI.  All other ROS are negative.  BP 128/80   Pulse 84   Temp 98 F (36.7 C) (Temporal)   Resp 16   Ht 5\' 8"  (1.727 m)   Wt 185 lb (83.9 kg)   SpO2 98%   BMI 28.13 kg/m   Physical Exam Vitals reviewed.  Constitutional:      Appearance: Normal appearance.  HENT:     Head: Normocephalic and atraumatic.  Eyes:     Conjunctiva/sclera: Conjunctivae normal.  Cardiovascular:     Rate and Rhythm: Normal rate and regular rhythm.     Pulses: Normal pulses.     Heart sounds: Normal heart sounds.  Pulmonary:     Effort: Pulmonary effort is normal.  Musculoskeletal:     Cervical back: Neck supple.  Neurological:     General: No focal deficit present.     Mental Status: He is alert and oriented to person, place, and time.  Psychiatric:        Mood and Affect: Mood normal.    Assessment/Plan: 1. Decreased urine stream Will check PSA and POCT UA today to further assess.  - PSA - POCT urinalysis dipstick  2. Essential hypertension BP on recheck at time of visit at 128/80. Discussed effect of salt, NSAIDs and pain level on BP. Asymptomatic. Will have him start DASH diet. Hopefully soon will get better control over pain with the PRP treatments from sports medicine. Then expect BP to be more steady. He is to get an ambulatory BP cuff to check BP at home daily, recording numbers. Follow-up in 10 days for review.  This visit occurred during the SARS-CoV-2 public health emergency.  Safety protocols were in place, including screening questions prior to the visit, additional usage of staff PPE, and extensive cleaning of exam room while observing appropriate contact time as indicated for disinfecting solutions.     Leeanne Rio, PA-C

## 2019-12-06 NOTE — Progress Notes (Signed)
MRI elbow shows possible ulnar nerve irritation at cubital tunnel and irritation at the tennis elbow area evidence of prior surgery.  Talk about this today during follow-up.

## 2019-12-06 NOTE — Telephone Encounter (Signed)
Per AVS from today, plan is for Jacob Foster to RTW 5/31. Pt requesting a letter with RTW date and any restrictions noted. Ok to send via [employer]

## 2019-12-06 NOTE — Patient Instructions (Signed)
Please go to the lab today for blood work.  I will call you with your results. We will alter treatment regimen(s) if indicated by your results.   I want you to get a BP cuff at home, checking BP once daily after you have sat down and relaxed for 5-10 minutes.  Write down the measurements and bring to follow-up visit.   Follow the dietary recommendations below.  I think the use of NSAIDs (anti inflammatories like Motrin, Aleve, Advil) is contributing to the fluctuation in BP.  As you get things taken care of with Dr. Georgina Snell I think this will help.   Please follow-up with me in 10 days for repeat evaluation.    DASH Eating Plan DASH stands for "Dietary Approaches to Stop Hypertension." The DASH eating plan is a healthy eating plan that has been shown to reduce high blood pressure (hypertension). It may also reduce your risk for type 2 diabetes, heart disease, and stroke. The DASH eating plan may also help with weight loss. What are tips for following this plan?  General guidelines  Avoid eating more than 2,300 mg (milligrams) of salt (sodium) a day. If you have hypertension, you may need to reduce your sodium intake to 1,500 mg a day.  Limit alcohol intake to no more than 1 drink a day for nonpregnant women and 2 drinks a day for men. One drink equals 12 oz of beer, 5 oz of wine, or 1 oz of hard liquor.  Work with your health care provider to maintain a healthy body weight or to lose weight. Ask what an ideal weight is for you.  Get at least 30 minutes of exercise that causes your heart to beat faster (aerobic exercise) most days of the week. Activities may include walking, swimming, or biking.  Work with your health care provider or diet and nutrition specialist (dietitian) to adjust your eating plan to your individual calorie needs. Reading food labels   Check food labels for the amount of sodium per serving. Choose foods with less than 5 percent of the Daily Value of sodium.  Generally, foods with less than 300 mg of sodium per serving fit into this eating plan.  To find whole grains, look for the word "whole" as the first word in the ingredient list. Shopping  Buy products labeled as "low-sodium" or "no salt added."  Buy fresh foods. Avoid canned foods and premade or frozen meals. Cooking  Avoid adding salt when cooking. Use salt-free seasonings or herbs instead of table salt or sea salt. Check with your health care provider or pharmacist before using salt substitutes.  Do not fry foods. Cook foods using healthy methods such as baking, boiling, grilling, and broiling instead.  Cook with heart-healthy oils, such as olive, canola, soybean, or sunflower oil. Meal planning  Eat a balanced diet that includes: ? 5 or more servings of fruits and vegetables each day. At each meal, try to fill half of your plate with fruits and vegetables. ? Up to 6-8 servings of whole grains each day. ? Less than 6 oz of lean meat, poultry, or fish each day. A 3-oz serving of meat is about the same size as a deck of cards. One egg equals 1 oz. ? 2 servings of low-fat dairy each day. ? A serving of nuts, seeds, or beans 5 times each week. ? Heart-healthy fats. Healthy fats called Omega-3 fatty acids are found in foods such as flaxseeds and coldwater fish, like sardines, salmon, and mackerel.  Limit how much you eat of the following: ? Canned or prepackaged foods. ? Food that is high in trans fat, such as fried foods. ? Food that is high in saturated fat, such as fatty meat. ? Sweets, desserts, sugary drinks, and other foods with added sugar. ? Full-fat dairy products.  Do not salt foods before eating.  Try to eat at least 2 vegetarian meals each week.  Eat more home-cooked food and less restaurant, buffet, and fast food.  When eating at a restaurant, ask that your food be prepared with less salt or no salt, if possible. What foods are recommended? The items listed may not  be a complete list. Talk with your dietitian about what dietary choices are best for you. Grains Whole-grain or whole-wheat bread. Whole-grain or whole-wheat pasta. Brown rice. [place] Morrow. Bulgur. Whole-grain and low-sodium cereals. Pita bread. Low-fat, low-sodium crackers. Whole-wheat flour tortillas. Vegetables Fresh or frozen vegetables (raw, steamed, roasted, or grilled). Low-sodium or reduced-sodium tomato and vegetable juice. Low-sodium or reduced-sodium tomato sauce and tomato paste. Low-sodium or reduced-sodium canned vegetables. Fruits All fresh, dried, or frozen fruit. Canned fruit in natural juice (without added sugar). Meat and other protein foods Skinless chicken or [place]. Ground chicken or [place]. Pork with fat trimmed off. Fish and seafood. Egg whites. Dried beans, peas, or lentils. Unsalted nuts, nut butters, and seeds. Unsalted canned beans. Lean cuts of beef with fat trimmed off. Low-sodium, lean deli meat. Dairy Low-fat (1%) or fat-free (skim) milk. Fat-free, low-fat, or reduced-fat cheeses. Nonfat, low-sodium ricotta or cottage cheese. Low-fat or nonfat yogurt. Low-fat, low-sodium cheese. Fats and oils Soft margarine without trans fats. Vegetable oil. Low-fat, reduced-fat, or light mayonnaise and salad dressings (reduced-sodium). Canola, safflower, olive, soybean, and sunflower oils. Avocado. Seasoning and other foods Herbs. Spices. Seasoning mixes without salt. Unsalted popcorn and pretzels. Fat-free sweets. What foods are not recommended? The items listed may not be a complete list. Talk with your dietitian about what dietary choices are best for you. Grains Baked goods made with fat, such as croissants, muffins, or some breads. Dry pasta or rice meal packs. Vegetables Creamed or fried vegetables. Vegetables in a cheese sauce. Regular canned vegetables (not low-sodium or reduced-sodium). Regular canned tomato sauce and paste (not low-sodium or reduced-sodium). Regular  tomato and vegetable juice (not low-sodium or reduced-sodium). Angie Fava. Olives. Fruits Canned fruit in a light or heavy syrup. Fried fruit. Fruit in cream or butter sauce. Meat and other protein foods Fatty cuts of meat. Ribs. Fried meat. Berniece Salines. Sausage. Bologna and other processed lunch meats. Salami. Fatback. Hotdogs. Bratwurst. Salted nuts and seeds. Canned beans with added salt. Canned or smoked fish. Whole eggs or egg yolks. Chicken or [place] with skin. Dairy Whole or 2% milk, cream, and half-and-half. Whole or full-fat cream cheese. Whole-fat or sweetened yogurt. Full-fat cheese. Nondairy creamers. Whipped toppings. Processed cheese and cheese spreads. Fats and oils Butter. Stick margarine. Lard. Shortening. Ghee. Bacon fat. Tropical oils, such as coconut, palm kernel, or palm oil. Seasoning and other foods Salted popcorn and pretzels. Onion salt, garlic salt, seasoned salt, table salt, and sea salt. Worcestershire sauce. Tartar sauce. Barbecue sauce. Teriyaki sauce. Soy sauce, including reduced-sodium. Steak sauce. Canned and packaged gravies. Fish sauce. Oyster sauce. Cocktail sauce. Horseradish that you find on the shelf. Ketchup. Mustard. Meat flavorings and tenderizers. Bouillon cubes. Hot sauce and Tabasco sauce. Premade or packaged marinades. Premade or packaged taco seasonings. Relishes. Regular salad dressings. Where to find more information:  National Heart, Lung, and Blood  Institute: https://wilson-eaton.com/  American Heart Association: www.heart.org Summary  The DASH eating plan is a healthy eating plan that has been shown to reduce high blood pressure (hypertension). It may also reduce your risk for type 2 diabetes, heart disease, and stroke.  With the DASH eating plan, you should limit salt (sodium) intake to 2,300 mg a day. If you have hypertension, you may need to reduce your sodium intake to 1,500 mg a day.  When on the DASH eating plan, aim to eat more fresh fruits and  vegetables, whole grains, lean proteins, low-fat dairy, and heart-healthy fats.  Work with your health care provider or diet and nutrition specialist (dietitian) to adjust your eating plan to your individual calorie needs. This information is not intended to replace advice given to you by your health care provider. Make sure you discuss any questions you have with your health care provider. Document Revised: 06/12/2017 Document Reviewed: 06/23/2016 Elsevier Patient Education  2020 [employer].

## 2019-12-07 ENCOUNTER — Encounter: Payer: Self-pay | Admitting: Family Medicine

## 2019-12-07 ENCOUNTER — Telehealth: Payer: Self-pay | Admitting: Family Medicine

## 2019-12-07 ENCOUNTER — Other Ambulatory Visit: Payer: Self-pay | Admitting: Emergency Medicine

## 2019-12-07 DIAGNOSIS — R39198 Other difficulties with micturition: Secondary | ICD-10-CM

## 2019-12-07 MED ORDER — TAMSULOSIN HCL 0.4 MG PO CAPS: 0.4000 mg | ORAL_CAPSULE | Freq: Every day | ORAL | 3 refills | Status: DC

## 2019-12-07 NOTE — Telephone Encounter (Signed)
FMLA forms have been updated.  Please re-fax to [facility].

## 2019-12-07 NOTE — Telephone Encounter (Signed)
New work note written to return to work on Tuesday the 14th. Work note was sent through my chart and a copy will be signed and available at the front desk as well.

## 2019-12-07 NOTE — Telephone Encounter (Signed)
Do we need a new set of forms for you to change RTW date or can you just cross through RTW and change on old forms?  Please advise.

## 2019-12-07 NOTE — Telephone Encounter (Signed)
Letter sent via [place], pt wife aware.

## 2019-12-07 NOTE — Telephone Encounter (Signed)
I noted that Dr. Georgina Snell completed disability/fmla ppwk with this info ( faxed to [facility] ). Called pt wife, she still needs a letter as they send this to his local leader.

## 2019-12-07 NOTE — Telephone Encounter (Signed)
Pt wife called, informed correct letter with RTW date of 12/26/2019 ready and in [place]. Disability/FMLA ppwk done yesterday with a RTW date of 12/12/2019 needs to be redone.

## 2019-12-07 NOTE — Telephone Encounter (Signed)
Patient's wife called asking if Jacob Foster could be excused from work until after his PRP injection (and improvement time.. she mentioned 2 weeks). He is scheduled for Tuesday, June 1st. I did offer him available times this week but he wanted to wait until next week.  If so, they will need a new work note.  Please advise.

## 2019-12-08 NOTE — Telephone Encounter (Signed)
Patient's wife called stating that the HR department reached out to her asking if the return to work note could reflect the restrictions that were mentioned in the [facility] paperwork.   She said that it can be sent through [place] and she can get it from there.

## 2019-12-09 ENCOUNTER — Encounter: Payer: Self-pay | Admitting: Family Medicine

## 2019-12-09 ENCOUNTER — Telehealth: Payer: Self-pay | Admitting: Physician Assistant

## 2019-12-09 DIAGNOSIS — I10 Essential (primary) hypertension: Secondary | ICD-10-CM

## 2019-12-09 MED ORDER — HYDROCHLOROTHIAZIDE 25 MG PO TABS: 25.0000 mg | ORAL_TABLET | Freq: Every day | ORAL | 1 refills | Status: DC

## 2019-12-09 NOTE — Telephone Encounter (Signed)
Pt's wife called in stating that her husband BP has been running around 162/111, she states that is was a standing bp and pt was laying in the bed piror to his bp being checked. He denies chest pain, ha, shortness of breath, or dizziness. Please advise

## 2019-12-09 NOTE — Telephone Encounter (Signed)
Completed and sent via mychart and ready to pick up at the front desk

## 2019-12-09 NOTE — Telephone Encounter (Signed)
Bp needs to be checked as discussed at visit -- with patient sitting in a comfortable position, feet on the floor and relaxed for at least 5-10 minutes. Make sure they try to check this way from this point forward. I would like to start him on HCTZ 25 mg once daily and have him for scheduled for follow-up in office in 1 week. Ok to send in Rx with quant 30 and 0 RF for now. Increase intake of dietary potassium.  Keep checking BP daily and recording -- bring to visit.   If noting any chest pain, dizziness, lightheadedness, shortness of breath or vision changes -- ER assessment.

## 2019-12-09 NOTE — Telephone Encounter (Signed)
Spoke with patient wife about how to check blood pressures discussed at his visit. With his numbers being so elevated, will start HCTZ 25 mg daily. Rx sent to the pharmacy Keep appointment min 1 week and bring blood pressure readings.

## 2019-12-13 ENCOUNTER — Other Ambulatory Visit: Payer: Self-pay

## 2019-12-13 ENCOUNTER — Ambulatory Visit (INDEPENDENT_AMBULATORY_CARE_PROVIDER_SITE_OTHER): Payer: Commercial Managed Care - PPO | Admitting: Family Medicine

## 2019-12-13 ENCOUNTER — Ambulatory Visit: Payer: Self-pay

## 2019-12-13 DIAGNOSIS — M7711 Lateral epicondylitis, right elbow: Secondary | ICD-10-CM

## 2019-12-13 MED ORDER — TRAMADOL HCL 50 MG PO TABS: 50.0000 mg | ORAL_TABLET | Freq: Three times a day (TID) | ORAL | 0 refills | Status: DC | PRN

## 2019-12-13 NOTE — Patient Instructions (Addendum)
Thank you for coming in today. See the attached instructions.  Avoid ibuprofen or aleve for 3 days.  Use tylenol or if in severe pain tramadol for pain.  Ok to use the wrist brace as well.   Call or go to the ER if you develop a large red swollen joint with extreme pain or oozing puss.

## 2019-12-13 NOTE — Progress Notes (Signed)
Patient presents to clinic today for previously scheduled right lateral epicondyle platelet rich plasma injection.  Procedure: Real-time Ultrasound Guided Injection of right lateral epicondyle Device: Philips Affiniti 50G Images permanently stored and available for review in the ultrasound unit. Verbal informed consent obtained.  Discussed risks and benefits of procedure. Warned about infection bleeding damage to structures skin hypopigmentation and fat atrophy among others. Patient expresses understanding and agreement Time-out conducted.   Noted no overlying erythema, induration, or other signs of local infection.   Skin prepped in a sterile fashion.   Local anesthesia: Topical Ethyl chloride.   With sterile technique and under real time ultrasound guidance:  4 mL of Marcaine injected achieving good anesthesia. Skin was again sterilized and  3 milliliters of leukocyte rich PRP was injected in a tenotomy pattern at insertion point of lateral epicondyle..   Completed without difficulty    Advised to call if fevers/chills, erythema, induration, drainage, or persistent bleeding.   Images permanently stored and available for review in the ultrasound unit.  Impression: Technically successful ultrasound guided injection.   Provided instructions on to avoid NSAIDs immediately following injection for the next 3 days.  In addition prescribe small amount of tramadol.  Plan to return to work as previously arranged in 2 weeks or so.

## 2019-12-16 ENCOUNTER — Other Ambulatory Visit: Payer: Self-pay

## 2019-12-16 ENCOUNTER — Encounter: Payer: Self-pay | Admitting: Physician Assistant

## 2019-12-16 ENCOUNTER — Ambulatory Visit: Payer: Commercial Managed Care - PPO | Admitting: Physician Assistant

## 2019-12-16 VITALS — BP 124/72 | HR 75 | Temp 97.9°F | Resp 16 | Ht 68.0 in | Wt 183.4 lb

## 2019-12-16 DIAGNOSIS — R399 Unspecified symptoms and signs involving the genitourinary system: Secondary | ICD-10-CM

## 2019-12-16 DIAGNOSIS — I10 Essential (primary) hypertension: Secondary | ICD-10-CM | POA: Insufficient documentation

## 2019-12-16 NOTE — Progress Notes (Signed)
Patient presents to clinic today for follow-up regarding HTN and BPH with LUTS.   In regards to hypertension, patient was placed on a regimen of HCTZ 25 mg daily. Endorses taking as directed and tolerating very well. Has been watching diet closely and following the DASH handout given at last visit. Patient denies chest pain, palpitations, lightheadedness, dizziness, vision changes or frequent headaches. Is checking BP levels at home and noted top number staying in the 120s. Notes his bottom number varies from 70-91.   BP Readings from Last 3 Encounters:  12/16/19 124/72  12/06/19 128/80  12/06/19 140/90   In regards to nocturia, patient endorses taking the tamsulosin as directed. Denies any noted side effect. Nocturia has decreased from 3-4 to 1. Still noting some occasional urinary hesitancy but much improved. Denies any new or worsening symptoms.   Past Medical History:  Diagnosis Date  . Abscess of axilla   . Arthritis   . Depression    seasonal affective D/O   . Family history of colon cancer 12/08/2018   Father and brother. Sees Dr. Enis Gash  . GERD (gastroesophageal reflux disease)   . Hidradenitis suppurativa   . History of chickenpox   . Hyperlipemia   . Medical history non-contributory   . Neck pain   . Serrated polyp of colon 12/08/2018    Current Outpatient Medications on File Prior to Visit  Medication Sig Dispense Refill  . carvedilol (COREG) 6.25 MG tablet Take 1 tablet by mouth 2 (two) times daily.    . hydrochlorothiazide (HYDRODIURIL) 25 MG tablet Take 1 tablet (25 mg total) by mouth daily. 30 tablet 1  . tamsulosin (FLOMAX) 0.4 MG CAPS capsule Take 1 capsule (0.4 mg total) by mouth daily after supper. 30 capsule 3  . traMADol (ULTRAM) 50 MG tablet Take 1 tablet (50 mg total) by mouth every 8 (eight) hours as needed for severe pain. 5 tablet 0   No current facility-administered medications on file prior to visit.    No Known Allergies  Family History    Problem Relation Age of Onset  . Hypertension Mother   . Hyperlipidemia Mother   . Breast cancer Mother   . Prostate cancer Father   . Kidney disease Father   . Colon cancer Father        in his 72's  . Throat cancer Brother        dies age 11 - alcoholic   . Hypertension Brother   . Esophageal cancer Brother   . Healthy Daughter   . Healthy Son   . Healthy Son   . Healthy Son   . Diabetes Brother   . Colon cancer Brother 26  . Colon cancer Brother 11  . Colon cancer Maternal Uncle   . Colon cancer Maternal Uncle   . Colon polyps Neg Hx   . Stomach cancer Neg Hx   . Rectal cancer Neg Hx     Social History   Socioeconomic History  . Marital status: Married    Spouse name: Edsel Petrin  . Number of children: 4  . Years of education: Not on file  . Highest education level: Not on file  Occupational History  . Occupation: Magazine features editor: [employer]  Tobacco Use  . Smoking status: Never Smoker  . Smokeless tobacco: Never Used  Substance and Sexual Activity  . Alcohol use: No  . Drug use: No  . Sexual activity: Yes  Other Topics Concern  . Not  on file  Social History Narrative   Originally from [place] Strain:   . Difficulty of Paying Living Expenses:   Food Insecurity:   . Worried About Charity fundraiser in the Last Year:   . Arboriculturist in the Last Year:   Transportation Needs:   . Film/video editor (Medical):   Marland Kitchen Lack of Transportation (Non-Medical):   Physical Activity:   . Days of Exercise per Week:   . Minutes of Exercise per Session:   Stress:   . Feeling of Stress :   Social Connections:   . Frequency of Communication with Friends and Family:   . Frequency of Social Gatherings with Friends and Family:   . Attends Religious Services:   . Active Member of Clubs or Organizations:   . Attends Archivist Meetings:   Marland Kitchen Marital Status:    Review of Systems - See HPI.   All other ROS are negative.  Ht 5\' 8"  (1.727 m)   Wt 183 lb 6 oz (83.2 kg)   BMI 27.88 kg/m   Physical Exam Vitals reviewed.  Constitutional:      Appearance: Normal appearance.  HENT:     Head: Normocephalic and atraumatic.  Eyes:     Conjunctiva/sclera: Conjunctivae normal.  Cardiovascular:     Rate and Rhythm: Normal rate and regular rhythm.     Heart sounds: Normal heart sounds.  Pulmonary:     Effort: Pulmonary effort is normal.     Breath sounds: Normal breath sounds.  Musculoskeletal:     Cervical back: Neck supple.  Neurological:     Mental Status: He is alert.  Psychiatric:        Mood and Affect: Mood normal.     Recent Results (from the past 2160 hour(s))  PSA     Status: None   Collection Time: 12/06/19  2:06 PM  Result Value Ref Range   PSA 0.74 0.10 - 4.00 ng/mL    Comment: Test performed using Access Hybritech PSA Assay, a parmagnetic partical, chemiluminecent immunoassay.  POCT urinalysis dipstick     Status: None   Collection Time: 12/06/19  2:08 PM  Result Value Ref Range   Color, UA yellow    Clarity, UA clear    Glucose, UA Negative Negative   Bilirubin, UA Negative    Ketones, UA Negative    Spec Grav, UA 1.025 1.010 - 1.025   Blood, UA Negative    pH, UA 6.0 5.0 - 8.0   Protein, UA Negative Negative   Urobilinogen, UA 1.0 0.2 or 1.0 E.U./dL   Nitrite, UA Negative    Leukocytes, UA Negative Negative   Appearance     Odor      Assessment/Plan: 1. Essential hypertension BP staying more stable at home. Normotensive today in office. Patient remains asymptomatic. Continue current regimen. Follow-up 2 months for reassessment. Will repeat BMP at that time.   2. Lower urinary tract symptoms (LUTS) Mild BPH with nocturia and some hesitancy. PSA and UA at last visit looked good. Much improved with Flomax. Discussed may take a few more weeks to reach full therapeutic affect. Will follow-up 8 weeks.   This visit occurred during the SARS-CoV-2  public health emergency.  Safety protocols were in place, including screening questions prior to the visit, additional usage of staff PPE, and extensive cleaning of exam room while observing appropriate contact time as indicated for disinfecting solutions.  Leeanne Rio, PA-C

## 2019-12-16 NOTE — Patient Instructions (Signed)
Please keep hydrated and continue the low salt diet. Continue current medications, increasing your intake of dietary potassium.  Lets follow-up again in 8 weeks for reassessment. We will need to update some blood work at that time due to the BP medication.  Again goal for BP is 110-130/60-80  Hang in there!   DASH Eating Plan DASH stands for "Dietary Approaches to Stop Hypertension." The DASH eating plan is a healthy eating plan that has been shown to reduce high blood pressure (hypertension). It may also reduce your risk for type 2 diabetes, heart disease, and stroke. The DASH eating plan may also help with weight loss. What are tips for following this plan?  General guidelines  Avoid eating more than 2,300 mg (milligrams) of salt (sodium) a day. If you have hypertension, you may need to reduce your sodium intake to 1,500 mg a day.  Limit alcohol intake to no more than 1 drink a day for nonpregnant women and 2 drinks a day for men. One drink equals 12 oz of beer, 5 oz of wine, or 1 oz of hard liquor.  Work with your health care provider to maintain a healthy body weight or to lose weight. Ask what an ideal weight is for you.  Get at least 30 minutes of exercise that causes your heart to beat faster (aerobic exercise) most days of the week. Activities may include walking, swimming, or biking.  Work with your health care provider or diet and nutrition specialist (dietitian) to adjust your eating plan to your individual calorie needs. Reading food labels   Check food labels for the amount of sodium per serving. Choose foods with less than 5 percent of the Daily Value of sodium. Generally, foods with less than 300 mg of sodium per serving fit into this eating plan.  To find whole grains, look for the word "whole" as the first word in the ingredient list. Shopping  Buy products labeled as "low-sodium" or "no salt added."  Buy fresh foods. Avoid canned foods and premade or frozen  meals. Cooking  Avoid adding salt when cooking. Use salt-free seasonings or herbs instead of table salt or sea salt. Check with your health care provider or pharmacist before using salt substitutes.  Do not fry foods. Cook foods using healthy methods such as baking, boiling, grilling, and broiling instead.  Cook with heart-healthy oils, such as olive, canola, soybean, or sunflower oil. Meal planning  Eat a balanced diet that includes: ? 5 or more servings of fruits and vegetables each day. At each meal, try to fill half of your plate with fruits and vegetables. ? Up to 6-8 servings of whole grains each day. ? Less than 6 oz of lean meat, poultry, or fish each day. A 3-oz serving of meat is about the same size as a deck of cards. One egg equals 1 oz. ? 2 servings of low-fat dairy each day. ? A serving of nuts, seeds, or beans 5 times each week. ? Heart-healthy fats. Healthy fats called Omega-3 fatty acids are found in foods such as flaxseeds and coldwater fish, like sardines, salmon, and mackerel.  Limit how much you eat of the following: ? Canned or prepackaged foods. ? Food that is high in trans fat, such as fried foods. ? Food that is high in saturated fat, such as fatty meat. ? Sweets, desserts, sugary drinks, and other foods with added sugar. ? Full-fat dairy products.  Do not salt foods before eating.  Try to eat at  least 2 vegetarian meals each week.  Eat more home-cooked food and less restaurant, buffet, and fast food.  When eating at a restaurant, ask that your food be prepared with less salt or no salt, if possible. What foods are recommended? The items listed may not be a complete list. Talk with your dietitian about what dietary choices are best for you. Grains Whole-grain or whole-wheat bread. Whole-grain or whole-wheat pasta. Brown rice. [place]. Bulgur. Whole-grain and low-sodium cereals. Pita bread. Low-fat, low-sodium crackers. Whole-wheat flour  tortillas. Vegetables Fresh or frozen vegetables (raw, steamed, roasted, or grilled). Low-sodium or reduced-sodium tomato and vegetable juice. Low-sodium or reduced-sodium tomato sauce and tomato paste. Low-sodium or reduced-sodium canned vegetables. Fruits All fresh, dried, or frozen fruit. Canned fruit in natural juice (without added sugar). Meat and other protein foods Skinless chicken or [place]. Ground chicken or [place]. Pork with fat trimmed off. Fish and seafood. Egg whites. Dried beans, peas, or lentils. Unsalted nuts, nut butters, and seeds. Unsalted canned beans. Lean cuts of beef with fat trimmed off. Low-sodium, lean deli meat. Dairy Low-fat (1%) or fat-free (skim) milk. Fat-free, low-fat, or reduced-fat cheeses. Nonfat, low-sodium ricotta or cottage cheese. Low-fat or nonfat yogurt. Low-fat, low-sodium cheese. Fats and oils Soft margarine without trans fats. Vegetable oil. Low-fat, reduced-fat, or light mayonnaise and salad dressings (reduced-sodium). Canola, safflower, olive, soybean, and sunflower oils. Avocado. Seasoning and other foods Herbs. Spices. Seasoning mixes without salt. Unsalted popcorn and pretzels. Fat-free sweets. What foods are not recommended? The items listed may not be a complete list. Talk with your dietitian about what dietary choices are best for you. Grains Baked goods made with fat, such as croissants, muffins, or some breads. Dry pasta or rice meal packs. Vegetables Creamed or fried vegetables. Vegetables in a cheese sauce. Regular canned vegetables (not low-sodium or reduced-sodium). Regular canned tomato sauce and paste (not low-sodium or reduced-sodium). Regular tomato and vegetable juice (not low-sodium or reduced-sodium). Angie Fava. Olives. Fruits Canned fruit in a light or heavy syrup. Fried fruit. Fruit in cream or butter sauce. Meat and other protein foods Fatty cuts of meat. Ribs. Fried meat. Berniece Salines. Sausage. Bologna and other processed lunch meats.  Salami. Fatback. Hotdogs. Bratwurst. Salted nuts and seeds. Canned beans with added salt. Canned or smoked fish. Whole eggs or egg yolks. Chicken or [place] with skin. Dairy Whole or 2% milk, cream, and half-and-half. Whole or full-fat cream cheese. Whole-fat or sweetened yogurt. Full-fat cheese. Nondairy creamers. Whipped toppings. Processed cheese and cheese spreads. Fats and oils Butter. Stick margarine. Lard. Shortening. Ghee. Bacon fat. Tropical oils, such as coconut, palm kernel, or palm oil. Seasoning and other foods Salted popcorn and pretzels. Onion salt, garlic salt, seasoned salt, table salt, and sea salt. Worcestershire sauce. Tartar sauce. Barbecue sauce. Teriyaki sauce. Soy sauce, including reduced-sodium. Steak sauce. Canned and packaged gravies. Fish sauce. Oyster sauce. Cocktail sauce. Horseradish that you find on the shelf. Ketchup. Mustard. Meat flavorings and tenderizers. Bouillon cubes. Hot sauce and Tabasco sauce. Premade or packaged marinades. Premade or packaged taco seasonings. Relishes. Regular salad dressings. Where to find more information:  National Heart, Lung, and [place]: https://wilson-eaton.com/  American Heart Association: www.heart.org Summary  The DASH eating plan is a healthy eating plan that has been shown to reduce high blood pressure (hypertension). It may also reduce your risk for type 2 diabetes, heart disease, and stroke.  With the DASH eating plan, you should limit salt (sodium) intake to 2,300 mg a day. If you have hypertension, you  may need to reduce your sodium intake to 1,500 mg a day.  When on the DASH eating plan, aim to eat more fresh fruits and vegetables, whole grains, lean proteins, low-fat dairy, and heart-healthy fats.  Work with your health care provider or diet and nutrition specialist (dietitian) to adjust your eating plan to your individual calorie needs. This information is not intended to replace advice given to you by your health  care provider. Make sure you discuss any questions you have with your health care provider. Document Revised: 06/12/2017 Document Reviewed: 06/23/2016 Elsevier Patient Education  2020 [employer].

## 2020-02-10 ENCOUNTER — Ambulatory Visit: Payer: Commercial Managed Care - PPO | Admitting: Physician Assistant

## 2020-02-10 DIAGNOSIS — Z0289 Encounter for other administrative examinations: Secondary | ICD-10-CM

## 2020-05-12 ENCOUNTER — Other Ambulatory Visit: Payer: Self-pay | Admitting: Physician Assistant

## 2020-05-12 DIAGNOSIS — I10 Essential (primary) hypertension: Secondary | ICD-10-CM

## 2020-05-14 NOTE — Telephone Encounter (Signed)
Patient is due for blood pressure 2 month follow up.

## 2020-08-27 ENCOUNTER — Other Ambulatory Visit: Payer: Self-pay

## 2020-08-27 ENCOUNTER — Encounter: Payer: Self-pay | Admitting: Family Medicine

## 2020-08-27 ENCOUNTER — Telehealth (INDEPENDENT_AMBULATORY_CARE_PROVIDER_SITE_OTHER): Payer: Commercial Managed Care - PPO | Admitting: Family Medicine

## 2020-08-27 VITALS — BP 168/112 | Temp 97.7°F

## 2020-08-27 DIAGNOSIS — I10 Essential (primary) hypertension: Secondary | ICD-10-CM

## 2020-08-27 DIAGNOSIS — J029 Acute pharyngitis, unspecified: Secondary | ICD-10-CM

## 2020-08-27 DIAGNOSIS — M545 Low back pain, unspecified: Secondary | ICD-10-CM

## 2020-08-27 MED ORDER — HYDROCHLOROTHIAZIDE 25 MG PO TABS: 25.0000 mg | ORAL_TABLET | Freq: Every day | ORAL | 0 refills | Status: DC

## 2020-08-27 MED ORDER — METHOCARBAMOL 500 MG PO TABS: 500.0000 mg | ORAL_TABLET | Freq: Three times a day (TID) | ORAL | 0 refills | Status: DC | PRN

## 2020-08-27 MED ORDER — CARVEDILOL 6.25 MG PO TABS: 6.2500 mg | ORAL_TABLET | Freq: Two times a day (BID) | ORAL | 0 refills | Status: DC

## 2020-08-27 MED ORDER — CETIRIZINE HCL 10 MG PO TABS: 10.0000 mg | ORAL_TABLET | Freq: Every day | ORAL | 11 refills | Status: DC

## 2020-08-27 NOTE — Progress Notes (Signed)
Virtual Visit via Video   I connected with patient on 08/27/20 at 11:00 AM EST by a video enabled telemedicine application and verified that I am speaking with the correct person using two identifiers.  Location patient: Home Location provider: [facility] Persons participating in the virtual visit: Patient, Provider, Coqui (Sabrina M)  I discussed the limitations of evaluation and management by telemedicine and the availability of in person appointments. The patient expressed understanding and agreed to proceed.  Subjective:   HPI:   Sore throat- sxs started ~1 week ago.  sxs worsens as the day goes on.  'it's not consistent'.  No fevers.  Ongoing cough and congestion.  + PND.  No known sick contacts.  No night sweats.  HTN- chronic problem, pt's BP is quite high at 168/112.  Pt has been off BP meds- Coreg 6.25mg  BID and HCTZ 25mg  daily- x2 months.  No CP, SOB above baseline.  Some blurry vision- but unchanged per patients.  Denies HAs.  Back pain- pt reports he hurt his back on 2/10.  Says when he goes from sitting to standing, there's a 'pinch' and he has to walk bent over due to pain.  Pain is in lower back.  No radiation of pain.  Pt has been taking Tylenol w/o relief.  Pt has a bruise over lower back from falling off a toilet.  ROS:   See pertinent positives and negatives per HPI.  Patient Active Problem List   Diagnosis Date Noted  . Essential hypertension 12/16/2019  . Lateral epicondylitis of right elbow 10/31/2019  . COVID-19 virus infection 03/15/2019  . Family history of colon cancer 12/08/2018  . Serrated polyp of colon 12/08/2018  . Gastroesophageal reflux disease without esophagitis 08/20/2018  . GAD (generalized anxiety disorder) 04/23/2018  . Seasonal affective disorder ([place]) 04/23/2018  . Overweight (BMI 25.0-29.9) 09/05/2017    Social History   Tobacco Use  . Smoking status: Never Smoker  . Smokeless tobacco: Never Used  Substance Use  Topics  . Alcohol use: No    Current Outpatient Medications:  .  carvedilol (COREG) 6.25 MG tablet, Take 1 tablet by mouth 2 (two) times daily. (Patient not taking: Reported on 08/27/2020), Disp: , Rfl:  .  hydrochlorothiazide (HYDRODIURIL) 25 MG tablet, Take 1 tablet (25 mg total) by mouth daily. Schedule a follow up appointment for refills of medication (Patient not taking: Reported on 08/27/2020), Disp: 30 tablet, Rfl: 0 .  tamsulosin (FLOMAX) 0.4 MG CAPS capsule, Take 1 capsule (0.4 mg total) by mouth daily after supper. (Patient not taking: Reported on 08/27/2020), Disp: 30 capsule, Rfl: 3 .  traMADol (ULTRAM) 50 MG tablet, Take 1 tablet (50 mg total) by mouth every 8 (eight) hours as needed for severe pain. (Patient not taking: Reported on 08/27/2020), Disp: 5 tablet, Rfl: 0  No Known Allergies  Objective:   BP (!) 168/112   Temp 97.7 F (36.5 C) (Temporal)  AAOx3, NAD NCAT, EOMI No obvious CN deficits Coloring WNL Audible nasal congestion Pt is able to speak clearly, coherently without shortness of breath or increased work of breathing.  Thought process is linear.  Mood is appropriate.   Assessment and Plan:   Sore throat- encouraged pt to get COVID test as many people note that sxs are similar to seasonal allergies w/ sore throat and congestion.  Will start daily antihistamine to decrease post-nasal drip and improve sore throat.  Reviewed supportive care and red flags that should prompt return.  Pt  expressed understanding and is in agreement w/ plan.   HTN- deteriorated.  Pt has not been taking meds recently.  Will restart BP meds and he will need a new provider to follow up with.  Pt expressed understanding and is in agreement w/ plan.   Back pain- new.  Lumbar.  Pt reports he injured himself on the toilet.  Pain is localized and doesn't radiate.  Doesn't improve w/ tylenol.  Will start Robaxin and heat in addition to his current tylenol.  Pt expressed understanding and is in  agreement w/ plan.    Annye Asa, MD 08/27/2020

## 2020-08-27 NOTE — Progress Notes (Signed)
I connected with  Jacob Foster on 08/27/20 by a video enabled telemedicine application and verified that I am speaking with the correct person using two identifiers.   I discussed the limitations of evaluation and management by telemedicine. The patient expressed understanding and agreed to proceed.

## 2020-09-04 ENCOUNTER — Telehealth: Payer: Self-pay | Admitting: Physician Assistant

## 2020-09-04 ENCOUNTER — Other Ambulatory Visit: Payer: Self-pay | Admitting: Emergency Medicine

## 2020-09-04 DIAGNOSIS — M545 Low back pain, unspecified: Secondary | ICD-10-CM

## 2020-09-04 NOTE — Telephone Encounter (Signed)
Spoke with patient wife and his back did improve some. Pain restarted and is worst with sitting. Having flares up and Robaxin is not helping. Missed work today. Referral placed to Sports medicine and work note sent to patient my chart

## 2020-09-04 NOTE — Telephone Encounter (Signed)
Patient's wife called back - would like an update

## 2020-09-04 NOTE — Telephone Encounter (Signed)
Pt's wife call back stating that pt's back pain is not getting better. She wanted to know what he should do, another appt with [facility] or a referral to sports med. Or ortho?   Please call wife back with an update.

## 2020-09-06 NOTE — Progress Notes (Signed)
I, Jacob Foster, LAT, ATC, am serving as scribe for Dr. Lynne Leader.  Jacob Foster is a 55 y.o. male who presents to [facility] today for low back pain.  He was last seen by Dr. Georgina Snell on 12/13/19 for R lateral epicondylitis.  Since then, pt reports new onset LBP since 08/23/20 when he fell back onto the toilet when the seat was coming down. Pt locates pain to midline of the low back.    During his evaluation I noticed a tremor in his left hand. When asked about it he notes this has been ongoing for about 3 months and typically occurs at rest but does occasionally occur with motion. He notes this is bothersome and does interfere with his quality of life activities such as grooming and feeding. Additionally it is very embarrassing and bothersome. He tries to hide his tremor when seated during work meetings by sitting on his hand but it often causes his entire arm to start shaking becomes difficult to hide. Additionally sometimes his tremor is so bad that he will shake the entire bed when he is trying to sleep and keep both himself and his wife awake at night.  Radiating pain: yes- into R thigh LE numbness/tingling: no Aggravating factors: transitioning from sit-to-stand, walking, standing erect Treatments tried: Tylenol, Robaxin  Pertinent review of systems: No fevers or chills  Relevant historical information: History of lateral epicondylitis right arm   Exam:  BP 128/86 (BP Location: Right Arm, Patient Position: Sitting, Cuff Size: Normal)   Pulse 65   Ht 5\' 8"  (1.727 m)   Wt 184 lb 12.8 oz (83.8 kg)   SpO2 98%   BMI 28.10 kg/m  General: Well Developed, well nourished, and in no acute distress.   MSK: L-spine normal-appearing Tender palpation lumbar paraspinal musculature and midline lower portion of the lumbar spine. Decreased lumbar motion. Lower extremity strength reflexes and sensation are intact in bilateral extremities. Antalgic gait. Neuro:  Tremor present left hand and arm at rest. Tremor diminishes with motion. No significant cogwheeling     Lab and Radiology Results  X-ray images L-spine were ordered today but not completed    Assessment and Plan: 55 y.o. male with lumbar spine pain ongoing for about 2 weeks. Pain thought to be due to muscle spasm and dysfunction. Plan for physical therapy. Would like to get x-rays if possible in the near future. Patient left without getting x-rays. Work note provided. Limited prescription of tizanidine at bedtime. Recheck in 1 month.  Left arm tremor: New. Certainly could be essential tremor tried but I am a little bit concerned for Parkinson's disease. Refer to neurology for further evaluation and management. Tremor is certainly distressing have enough that it should be evaluated and treated further.   PDMP not reviewed this encounter. Orders Placed This Encounter  Procedures  . DG Lumbar Spine 2-3 Views    Standing Status:   Future    Standing Expiration Date:   09/07/2021    Order Specific Question:   Reason for Exam (SYMPTOM  OR DIAGNOSIS REQUIRED)    Answer:   low back pain    Order Specific Question:   Preferred imaging location?    Answer:   [facility]  . Ambulatory referral to Physical Therapy    Referral Priority:   Routine    Referral Type:   Physical Medicine    Referral Reason:   Specialty Services Required    Requested Specialty:  Physical Therapy    Number of Visits Requested:   1  . Ambulatory referral to Neurology    Referral Priority:   Routine    Referral Type:   Consultation    Referral Reason:   Specialty Services Required    Requested Specialty:   Neurology    Number of Visits Requested:   1   Meds ordered this encounter  Medications  . tiZANidine (ZANAFLEX) 4 MG tablet    Sig: Take 1 tablet (4 mg total) by mouth every 6 (six) hours as needed for muscle spasms.    Dispense:  30 tablet    Refill:  1     Discussed warning signs or  symptoms. Please see discharge instructions. Patient expresses understanding.   The above documentation has been reviewed and is accurate and complete Lynne Leader, M.D.

## 2020-09-07 ENCOUNTER — Ambulatory Visit (INDEPENDENT_AMBULATORY_CARE_PROVIDER_SITE_OTHER): Payer: Commercial Managed Care - PPO | Admitting: Family Medicine

## 2020-09-07 ENCOUNTER — Other Ambulatory Visit: Payer: Self-pay

## 2020-09-07 VITALS — BP 128/86 | HR 65 | Ht 68.0 in | Wt 184.8 lb

## 2020-09-07 DIAGNOSIS — M5441 Lumbago with sciatica, right side: Secondary | ICD-10-CM

## 2020-09-07 DIAGNOSIS — M545 Low back pain, unspecified: Secondary | ICD-10-CM

## 2020-09-07 DIAGNOSIS — R251 Tremor, unspecified: Secondary | ICD-10-CM

## 2020-09-07 MED ORDER — TIZANIDINE HCL 4 MG PO TABS: 4.0000 mg | ORAL_TABLET | Freq: Four times a day (QID) | ORAL | 1 refills | Status: DC | PRN

## 2020-09-07 NOTE — Patient Instructions (Addendum)
Thank you for coming in today.  I've referred you to Physical Therapy.  Let us know if you don't hear from them in one week.  Try the tizanidine for muscle relaxer mostly at bedtime.   Recheck in 1 month.   For tremor you should hear from neurology soon. Let me know if you do not hear soon.   Heating pad and TENS unit will help.   TENS UNIT: This is helpful for muscle pain and spasm.   Search and Purchase a TENS 7000 2nd edition at  www.tenspros.com or www.[place].com It should be less than $30.     TENS unit instructions: Do not shower or bathe with the unit on . Turn the unit off before removing electrodes or batteries . If the electrodes lose stickiness add a drop of water to the electrodes after they are disconnected from the unit and place on plastic sheet. If you continued to have difficulty, call the TENS unit company to purchase more electrodes. . Do not apply lotion on the skin area prior to use. Make sure the skin is clean and dry as this will help prolong the life of the electrodes. . After use, always check skin for unusual red areas, rash or other skin difficulties. If there are any skin problems, does not apply electrodes to the same area. . Never remove the electrodes from the unit by pulling the wires. . Do not use the TENS unit or electrodes other than as directed. . Do not change electrode placement without consultating your therapist or physician. Marland Kitchen Keep 2 fingers with between each electrode. . Wear time ratio is 2:1, on to off times.    For example on for 30 minutes off for 15 minutes and then on for 30 minutes off for 15 minutes

## 2020-09-10 ENCOUNTER — Ambulatory Visit (INDEPENDENT_AMBULATORY_CARE_PROVIDER_SITE_OTHER): Payer: Commercial Managed Care - PPO

## 2020-09-10 DIAGNOSIS — M5441 Lumbago with sciatica, right side: Secondary | ICD-10-CM | POA: Diagnosis not present

## 2020-09-11 ENCOUNTER — Telehealth: Payer: Self-pay | Admitting: Physician Assistant

## 2020-09-11 DIAGNOSIS — I10 Essential (primary) hypertension: Secondary | ICD-10-CM

## 2020-09-11 MED ORDER — CARVEDILOL 6.25 MG PO TABS: 6.2500 mg | ORAL_TABLET | Freq: Two times a day (BID) | ORAL | 0 refills | Status: DC

## 2020-09-11 NOTE — Progress Notes (Signed)
X-ray lumbar spine shows some arthritis changes.

## 2020-09-11 NOTE — Telephone Encounter (Signed)
Pt's wife called in stating that pt is out of the carvedilol it was sent in on 2/14 states 2tabs per day with only a qty of 30. Please advise is Dr.Tabori wanted him to continue this medication.

## 2020-09-11 NOTE — Telephone Encounter (Signed)
Rx sent to the preferred patient pharmacy  

## 2020-09-18 NOTE — Progress Notes (Signed)
Assessment/Plan:   1.  Idiopathic Parkinson's disease.  The patient has tremor, bradykinesia, rigidity and mild postural instability.  -We discussed the diagnosis as well as pathophysiology of the disease.  We discussed treatment options as well as prognostic indicators.  Patient education was provided.  -We discussed that it used to be thought that levodopa would increase risk of melanoma but now it is believed that Parkinsons itself likely increases risk of melanoma. he is to get regular skin checks.  - We talked about medication options as well as potential future surgical options.  We talked about safety in the home.  -We decided to add carbidopa/levodopa 25/100.  1/2 tab tid x 1 wk, then 1/2 in am & noon & 1 at night for a week, then 1/2 in am &1 at noon &night for a week, then 1 po tid.  Risks, benefits, side effects and alternative therapies were discussed.  The opportunity to ask questions was given and they were answered to the best of my ability.  The patient expressed understanding and willingness to follow the outlined treatment protocols.  -We discussed community resources in the area including patient support groups and community exercise programs for PD and pt education was provided to the patient.  -We will do lab work today.  Has not had basic labs since September, 2020.  He will have a chemistry, TSH  -He will have an MRI of the brain given young onset and hyperreflexia.  Subjective:   Jacob Foster was seen today in the movement disorders clinic for neurologic consultation at the request of Gregor Hams, MD.  The consultation is for the evaluation of tremor.  Records from sports medicine are reviewed.  This patient is accompanied in the office by his spouse who supplements the history.  Tremor: Yes.     How long has it been going on? 5 months  At rest or with activation?  Rest  Fam hx of tremor?  No.  Located where?  Left hand x 5 months; left leg started about same time  (noting when relaxed in the bed)  Affected by caffeine: doesn't drink caffeine  Affected by alcohol:  Doesn't drink alcohol  Affected by stress:  Yes.    Affected by fatigue:  Yes.    Affects ADL's (tying shoes, brushing teeth, etc):  No.  Tremor inducing meds:  No.,  On carvedilol which should be helping tremor  Other Specific Symptoms:  Voice: no change Sleep: has osas but doesn't wear cpap  Vivid Dreams:  Yes.    Acting out dreams:  Yes.   Wet Pillows: No. Postural symptoms:  some  Falls?  No. Bradykinesia symptoms: shuffling gait and difficulty getting out of a chair Loss of smell:  Yes.   Loss of taste:  No. Urinary Incontinence:  No., has urgency Difficulty Swallowing:  No. Handwriting, micrographia: No. Trouble with ADL's:  No.  Trouble buttoning clothing: slower N/V:  No. Lightheaded:  Yes.  , occasionally  Syncope: No. Diplopia:  No. Dyskinesia:  No.  Neuroimaging of the brain has not previously been performed.   PREVIOUS MEDICATIONS: none to date  ALLERGIES:  No Known Allergies  CURRENT MEDICATIONS:  Current Outpatient Medications  Medication Instructions  . carvedilol (COREG) 6.25 mg, Oral, 2 times daily  . cetirizine (ZYRTEC) 10 mg, Oral, Daily  . hydrochlorothiazide (HYDRODIURIL) 25 mg, Oral, Daily, Schedule a follow up appointment for refills of medication  . tamsulosin (FLOMAX) 0.4 mg, Oral, Daily after supper  .  tiZANidine (ZANAFLEX) 4 mg, Oral, Every 6 hours PRN  . traMADol (ULTRAM) 50 mg, Oral, Every 8 hours PRN    Objective:   PHYSICAL EXAMINATION:    VITALS:   Vitals:   09/20/20 1343  BP: 124/80  Pulse: 66  SpO2: 98%  Weight: 192 lb (87.1 kg)  Height: 5\' 8"  (1.727 m)    GEN:  The patient appears stated age and is in NAD. HEENT:  Normocephalic, atraumatic.  The mucous membranes are moist. The superficial temporal arteries are without ropiness or tenderness. CV:  RRR Lungs:  CTAB Neck/HEME:  There are no carotid bruits  bilaterally.  Neurological examination:  Orientation: The patient is alert and oriented x3.  Cranial nerves: There is good facial symmetry.  There is significant facial hypomima.  Extraocular muscles are intact. The visual fields are full to confrontational testing. The speech is fluent and clear. Soft palate rises symmetrically and there is no tongue deviation. Hearing is intact to conversational tone. Sensation: Sensation is intact to light touch throughout (facial, trunk, extremities). Vibration is intact at the bilateral big toe. There is no extinction with double simultaneous stimulation.  Motor: Strength is 5/5 in the bilateral upper and lower extremities.  Minimal decreased grip strength on the left.  Some trouble with shoulder abduction on the left.   Shoulder shrug is equal and symmetric.  There is no pronator drift. Deep tendon reflexes: Deep tendon reflexes are 3-/4 at the bilateral biceps, triceps, brachioradialis, patella and achilles. Plantar responses are downgoing bilaterally.  Movement examination: Tone: There is mod increased tone in the LUE/LLE.  There is mild increased tone in the RUE Abnormal movements: there is mod tremor in the LUE and rare in the LLE Coordination:  There is  decremation with RAM's, with any form of RAMS, including alternating supination and pronation of the forearm, hand opening and closing, finger taps, heel taps and toe taps on the L Gait and Station: The patient has mild difficulty arising out of a deep-seated chair without the use of the hands. The patient's stride length is decreased with decreased arm swing, left greater than right.  The patient has a negative pull test.     I have reviewed and interpreted the following labs independently   Chemistry      Component Value Date/Time   NA 136 03/28/2019 1000   K 4.2 03/28/2019 1000   CL 103 03/28/2019 1000   CO2 27 03/28/2019 1000   BUN 17 03/28/2019 1000   CREATININE 0.91 03/28/2019 1000    CREATININE 1.10 11/27/2017 1630      Component Value Date/Time   CALCIUM 9.2 03/28/2019 1000   ALKPHOS 63 03/28/2019 1000   AST 14 03/28/2019 1000   ALT 26 03/28/2019 1000   BILITOT 0.6 03/28/2019 1000      No results found for: TSH Lab Results  Component Value Date   WBC 6.9 03/28/2019   HGB 16.3 03/28/2019   HCT 48.9 03/28/2019   MCV 87.7 03/28/2019   PLT 289.0 03/28/2019      Total time spent on today's visit was 60 minutes, including both face-to-face time and nonface-to-face time.  Time included that spent on review of records (prior notes available to me/labs/imaging if pertinent), discussing treatment and goals, answering patient's questions and coordinating care.  Cc:  Brunetta Jeans, PA-C

## 2020-09-20 ENCOUNTER — Encounter: Payer: Self-pay | Admitting: Neurology

## 2020-09-20 ENCOUNTER — Ambulatory Visit: Payer: Commercial Managed Care - PPO | Admitting: Neurology

## 2020-09-20 ENCOUNTER — Other Ambulatory Visit (INDEPENDENT_AMBULATORY_CARE_PROVIDER_SITE_OTHER): Payer: Commercial Managed Care - PPO

## 2020-09-20 ENCOUNTER — Telehealth: Payer: Self-pay | Admitting: Family Medicine

## 2020-09-20 ENCOUNTER — Encounter: Payer: Self-pay | Admitting: Family Medicine

## 2020-09-20 ENCOUNTER — Other Ambulatory Visit: Payer: Self-pay

## 2020-09-20 VITALS — BP 124/80 | HR 66 | Ht 68.0 in | Wt 192.0 lb

## 2020-09-20 DIAGNOSIS — G20 Parkinson's disease: Secondary | ICD-10-CM | POA: Diagnosis not present

## 2020-09-20 DIAGNOSIS — R251 Tremor, unspecified: Secondary | ICD-10-CM | POA: Diagnosis not present

## 2020-09-20 DIAGNOSIS — G20A1 Parkinson's disease without dyskinesia, without mention of fluctuations: Secondary | ICD-10-CM | POA: Insufficient documentation

## 2020-09-20 DIAGNOSIS — G8194 Hemiplegia, unspecified affecting left nondominant side: Secondary | ICD-10-CM

## 2020-09-20 LAB — TSH: TSH: 1.28 u[IU]/mL (ref 0.35–4.50)

## 2020-09-20 MED ORDER — CARBIDOPA-LEVODOPA 25-100 MG PO TABS: 1.0000 | ORAL_TABLET | Freq: Three times a day (TID) | ORAL | 1 refills | Status: DC

## 2020-09-20 NOTE — Patient Instructions (Addendum)
1.   Your provider has requested that you have labwork completed today. Please go to [facility] ([id number]) on the second floor of this building before leaving the office today. You do not need to check in. If you are not called within 15 minutes please check with the front desk.   2.   We have sent a referral to [facility] for your MRI and they will call you directly to schedule your appointment. They are located at [place]. If you need to contact them directly please call [phone].   3.   Start Carbidopa Levodopa as follows:  Take 1/2 tablet three times daily, at least 30 minutes before meals (approximately 5am/10am/3pm), for one week  Then take 1/2 tablet in the morning, 1/2 tablet in the afternoon, 1 tablet in the evening, at least 30 minutes before meals, for one week  Then take 1/2 tablet in the morning, 1 tablet in the afternoon, 1 tablet in the evening, at least 30 minutes before meals, for one week  Then take 1 tablet three times daily at 5am/10am/3pm, at least 30 minutes before meals   As a reminder, carbidopa/levodopa can be taken at the same time as a carbohydrate, but we like to have you take your pill either 30 minutes before a protein source or 1 hour after as protein can interfere with carbidopa/levodopa absorption.   Online Resources for Power over Parkinson's Group March 2022  . Local [place] Online Groups  o Power over [employer] Group :   - Power Over Parkinson's Patient Education Group will be Wednesday, March 9th at 2pm via Zoom.   - Upcoming Power over Parkinson's Meetings:  2nd Wednesdays of the month at 2 pm:       April 13th, May 11th - Contact Amy Marriott at [email or web address] if interested in participating in this online group o Parkinson's Care Partners Group:    3rd Mondays, Contact Corwin Levins o Atypical Parkinsonian Patient Group:   4th Wednesdays, Contact Corwin Levins o If you are interested in  participating in these online groups with Judson Roch, please contact her directly for how to join those meetings.  Her contact information is [email or web address].  She will send you a link to join the [employer].  (Please note that Corwin Levins , MSW, LCSW, has resigned her position at [facility], but will continue to lead the online groups temporarily)  . [place]:  [email or web address] o PD Health at Home continues:  Mindfulness Mondays, Expert Briefing Tuesdays, Wellness Wednesdays, Take Time Thursdays, Fitness Fridays -Listings for March 2022 are on the website o Upcoming Webinar:  Conversations about Complementary Therapies and PD.  Wednesday, March 2nd @ 1 pm o Upcoming Webinar:  Can We Put the Brakes on PD Progression?  Wednesday, April 6th @ 1 pm o Geneticist, molecular) at [email or web address] o  Please check out their website to sign up for emails and see their full online offerings  . [place]:  [email or web address]  o Upcoming Webinar:   Trouble Sleeping?  What to Know about Acting out Dreams and other Sleep Issues.  Thursday, March 17th @ 12 noon o Check out additional information on their website to see their full online offerings  . [place]:  [email or web address] o Upcoming Webinar:  Women and Parkinson's.  Tuesday, March 8th at 2 pm o Care Partner Monthly Meetup.  With 06-07-1991 [place].  First Tuesday of each month, 2 pm o Check out additional  information to Live Well Today on their website  . Parkinson and Movement Disorders (PMD) Alliance:  www.pmdalliance.org o [employer] Online:  Online Education Events o Sign up for emails, which are sent weekly to give you updates on programming and online offerings   . Parkinson's Association of the [place]:  www.parkinsonassociation.org o Information on online support groups, education events, and online exercises including Yoga,  Parkinson's exercises and more-LOTS of information on links to PD resources and online events o Virtual Support Group through Parkinson's Association of the [place]; next one is scheduled for Wednesday, October 17, 2020 at 2 pm. (These are typically scheduled for the 1st Wednesday of the month at 2 pm).  Visit website for details.  . Additional links for movement activities: o PWR! Moves Classes at [place] RESUMED!  Wednesdays 10 and 11 am.  Contact Amy Marriott, PT [email or web address] or [phone] if interested o Here is a link to the PWR!Moves classes on Zoom from [place] - Daily Mon-Sat at 10:00. Via Zoom, FREE and open to all.  There is also a link below via Facebook if you use that platform. - [email or web address].si - [email or web address]://[email or web address].no o Parkinson's Wellness Recovery (PWR! Moves)  www.pwr4life.org - Info on the PWR! Virtual Experience:  You will have access to our expertise through self-assessment, guided plans that start with the PD-specific fundamentals, educational content, tips, Q&A with an expert, and a growing Art therapist of PD-specific pre-recorded and live exercise classes of varying types and intensity - both physical and cognitive! If that is not enough, we offer 1:1 wellness consultations (in-person or virtual) to personalize your PWR! Research scientist (medical).  - Check out the PWR! Move of the month on the [place] Recovery website:  [email or web address] o [employer] Fridays:  - As part of the PD Health @ Home program, this free video series focuses each week on one aspect of fitness designed to support people living with Parkinson's.  These weekly videos highlight the [place] recent fitness  guidelines for people with Parkinson's disease. -  HollywoodSale.dk o Dance for PD website is offering free, live-stream classes throughout the week, as well as links to [employer] of classes:  https://danceforparkinsons.[email or web address]/ o Dance for Parkinson's Class:  [place].  Free offering for people with Parkinson's and care partners; virtual class.  o For more information, contact [phone] or email Ruffin Frederick at [email or web address] o Virtual dance and Pilates for Parkinson's classes: Click on the Community Tab> Parkinson's Movement Initiative Tab.  To register for classes and for more information, visit www.[email or web address].co.za and click the "community" tab.     o [employer] Parkinson's Cycling Classes  - [employer]: 1pm on Fridays-Live classes at [facility] [employer] at [email or web address] or [phone]) [employer] [employer]: Virtual Classes Mondays and Thursdays (contact [place] at [email or web address] or [phone])   o [place] levels of classes are offered Tuesdays and Thursdays:  10:30 am,  12 noon & 1:45 pm at [facility].  - Active Stretching with Paula Compton Class starting in March, on Fridays - To observe a class or for  more information, call [phone] or email [email or web address] . Well[employer]Spring [employer]: o Chief Technology Officer Opportunities:  www.well-springsolutions.org/caregiver-education/caregiver-support-group.  You may also contact Vickki Muff at [email or web address] or ([phone].   o Virtual Caregiver Retreat, Monday, February 21st, 3-5 pm o Powerful Tools for Caregivers, 6-wk series for caregivers, in-person, Thursdays March 10, 17, 24, 31 and April 7, 14, from 10:30-12:30 at  [place] [facility], [place].  Contact Vickki Muff (see above) to  register o Well-Spring Navigator:  Just1Navigator program, a free service to help individuals and families through the journey of determining care for older adults.  The "Navigator" is a Education officer, museum, Arnell Asal, who will speak with a prospective client and/or loved ones to provide an assessment of the situation and a set of recommendations for a personalized care plan -- all free of charge, and whether Well[employer]Spring [employer] offers the needed service or not. If the need is not a service we provide, we are well-connected with reputable programs in town that we can refer you to.  www.well-springsolutions.org or to speak with the Navigator, call [phone].

## 2020-09-20 NOTE — Telephone Encounter (Signed)
Letter written and sent through my chart.  A copy was also printed and signed it is available at the front desk now.

## 2020-09-20 NOTE — Telephone Encounter (Signed)
Pt most recent note was to RTW in 1-3 weeks. Pt has initial PT eval on 3/15, requesting RTW note with a return date of 10/01/2020.

## 2020-09-20 NOTE — Telephone Encounter (Signed)
Correction: return to work date should be 10/05/2020

## 2020-09-21 LAB — COMPREHENSIVE METABOLIC PANEL
ALT: 34 U/L (ref 0–53)
AST: 28 U/L (ref 0–37)
Albumin: 4.4 g/dL (ref 3.5–5.2)
Alkaline Phosphatase: 64 U/L (ref 39–117)
BUN: 17 mg/dL (ref 6–23)
CO2: 27 mEq/L (ref 19–32)
Calcium: 10 mg/dL (ref 8.4–10.5)
Chloride: 100 mEq/L (ref 96–112)
Creatinine, Ser: 1.1 mg/dL (ref 0.40–1.50)
GFR: 77.19 mL/min (ref >60.00)
Glucose, Bld: 85 mg/dL (ref 70–99)
Potassium: 3.9 mEq/L (ref 3.5–5.1)
Sodium: 139 mEq/L (ref 135–145)
Total Bilirubin: 0.5 mg/dL (ref 0.2–1.2)
Total Protein: 7.9 g/dL (ref 6.0–8.3)

## 2020-09-26 ENCOUNTER — Other Ambulatory Visit: Payer: Self-pay

## 2020-09-26 ENCOUNTER — Telehealth: Payer: Self-pay | Admitting: Physician Assistant

## 2020-09-26 DIAGNOSIS — I10 Essential (primary) hypertension: Secondary | ICD-10-CM

## 2020-09-26 MED ORDER — HYDROCHLOROTHIAZIDE 25 MG PO TABS: 25.0000 mg | ORAL_TABLET | Freq: Every day | ORAL | 0 refills | Status: DC

## 2020-09-26 NOTE — Telephone Encounter (Signed)
..  Medication Refills  Last OV:  Medication:  hydrochlorothizide 25 mg.  Pharmacy:  [facility] Let patient know to contact pharmacy at the end of the day to make sure medication is ready.   Please notify patient to allow 48-72 hours to process.  Encourage patient to contact the pharmacy for refills or they can request refills through [place] out below:   Last refill:  QTY:  Refill Date:    Other Comments:  Patient would also like to know if he is supposed to keep taking this.  Please advise   Okay for refill?  Please advise.

## 2020-09-26 NOTE — Telephone Encounter (Signed)
Refilled patient Rx. Note to pharmacy that patient will need to find new provider for further refills.

## 2020-10-04 NOTE — Progress Notes (Signed)
I, [name], LAT, ATC, am serving as scribe for Dr. Lynne Leader.  Jacob Foster is a 55 y.o. male who presents to [facility] today for f/u of LBP since 08/23/20.  He was last seen by Dr. Georgina Snell on 09/07/20 and was referred to PT at [facility].  He has completed 4 PT sessions.  He was also prescribed Tizanidine.  Since his last visit, pt reports back pain is much improved, 85-90% improvement. No numbness/tingling noted. Pt reports he still feels a bit of aching in his back, but nothing like it was at his last visit.  Pt has a visit w/ neurology on 09/20/20 and was dx w/ Parkinson and was prescribed carbidopa. Pt was demonstrating a tremor in L arm and speech difficulties at this morning's visit.   Pt notes he has pain in L shoulder ongoing x 6 months. Pt locates pain to anterior aspect of L shoulder. Pt has decreased AROM w/ ABD and ER.   Diagnostic imaging: 09/10/20 L-spine XR  12/29/18 L shoulder XR   Pertinent review of systems: no fever or chills  Relevant historical information: HTN, Parkinsons   Exam:  BP 111/78 (BP Location: Right Arm, Patient Position: Sitting, Cuff Size: Normal)   Pulse 68   Wt 184 lb (83.5 kg)   SpO2 97%   BMI 27.98 kg/m  General: Well Developed, well nourished, and in no acute distress.   MSK: Left shoulder: Normal appearing.  Normal motion. Pain with abduction.  Intact strength.  Positive hawkins test and neers test. Positive empty can test.  Negative yergasons test and speeds test.  Pulses and cap refill intact today.     Lab and Radiology Results  Procedure: Real-time Ultrasound Guided Injection of left shoulder subacromial bursa Device: Philips Affiniti 50G Images permanently stored and available for review in PACS Evaluation prior to injection with [place]: Intact biceps tendon. Normal subscap. Intact supraspinatus tendon. Mild subacromial bursitis.  Intact infraspinatus tendon.  AC DJD  Verbal informed consent  obtained.  Discussed risks and benefits of procedure. Warned about infection bleeding damage to structures skin hypopigmentation and fat atrophy among others. Patient expresses understanding and agreement Time-out conducted.   Noted no overlying erythema, induration, or other signs of local infection.   Skin prepped in a sterile fashion.   Local anesthesia: Topical Ethyl chloride.   With sterile technique and under real time ultrasound guidance:  40mg  kenalog and 65ml marcaine injected into bursa. Fluid seen entering the bursa.   Completed without difficulty   Pain immediately resolved suggesting accurate placement of the medication.   Advised to call if fevers/chills, erythema, induration, drainage, or persistent bleeding.   Images permanently stored and available for review in the ultrasound unit.  Impression: Technically successful ultrasound guided injection.      Assessment and Plan: 55 y.o. male with left shoulder pain due to subacromial bursitis and RTC tendonitis. Plan for PT and injection. Recheck in 6 weeks.   Parkinson's disease. Improved with neurology care. However this is contributory to both his back pain and his shoulder pain.    PDMP not reviewed this encounter. Orders Placed This Encounter  Procedures  . [place] LIMITED JOINT SPACE STRUCTURES UP LEFT(NO LINKED CHARGES)    Standing Status:   Future    Number of Occurrences:   1    Standing Expiration Date:   04/07/2021    Order Specific Question:   Reason for Exam (SYMPTOM  OR DIAGNOSIS REQUIRED)  Answer:   chronic left shoulder pain    Order Specific Question:   Preferred imaging location?    Answer:   [facility]  . DG Shoulder Left    Standing Status:   Future    Standing Expiration Date:   10/05/2021    Order Specific Question:   Reason for Exam (SYMPTOM  OR DIAGNOSIS REQUIRED)    Answer:   eval left shoulder pain    Order Specific Question:   Preferred imaging location?    Answer:    [facility]  . Ambulatory referral to Physical Therapy    Referral Priority:   Routine    Referral Type:   Physical Medicine    Referral Reason:   Specialty Services Required    Requested Specialty:   Physical Therapy    Number of Visits Requested:   1   No orders of the defined types were placed in this encounter.    Discussed warning signs or symptoms. Please see discharge instructions. Patient expresses understanding.   The above documentation has been reviewed and is accurate and complete Lynne Leader, M.D.

## 2020-10-05 ENCOUNTER — Other Ambulatory Visit: Payer: Self-pay

## 2020-10-05 ENCOUNTER — Ambulatory Visit: Payer: Self-pay

## 2020-10-05 ENCOUNTER — Ambulatory Visit (INDEPENDENT_AMBULATORY_CARE_PROVIDER_SITE_OTHER): Payer: Commercial Managed Care - PPO | Admitting: Family Medicine

## 2020-10-05 VITALS — BP 111/78 | HR 68 | Wt 184.0 lb

## 2020-10-05 DIAGNOSIS — G8929 Other chronic pain: Secondary | ICD-10-CM

## 2020-10-05 DIAGNOSIS — M25512 Pain in left shoulder: Secondary | ICD-10-CM

## 2020-10-05 NOTE — Patient Instructions (Signed)
Thank you for coming in today.  Call or go to the ER if you develop a large red swollen joint with extreme pain or oozing puss.   I've referred you to Physical Therapy.  Let us know if you don't hear from them in one week.  Recheck in 6 weeks.

## 2020-10-08 ENCOUNTER — Other Ambulatory Visit: Payer: Commercial Managed Care - PPO

## 2020-10-08 ENCOUNTER — Telehealth: Payer: Self-pay | Admitting: Neurology

## 2020-10-08 NOTE — Telephone Encounter (Signed)
Spoke with patients wife who states the patient is requesting to be seen sooner. She states the patient is complaining of memory loss and has some questions about his medication. The wife did not go into to much detail. She states the patient would like another assessment before his original 5 month follow up in August. I advised wife that I would speak with Dr Tat and gets back to her. She voiced understanding.

## 2020-10-08 NOTE — Telephone Encounter (Signed)
Patient was added to the Wait list

## 2020-10-08 NOTE — Telephone Encounter (Signed)
Patient's wife, Edsel Petrin, called and said the patient asked her to call on his behalf and schedule another appointment with Dr. Carles Collet before his follow up appointment in August.   She said he has additional questions for the doctor about what is going on with him. She did not know of any specifics and requested a call back to the patient directly for more details prior to scheduling.

## 2020-10-08 NOTE — Telephone Encounter (Signed)
Jacob Foster can put him on cx list.  Pt just seen this month.

## 2020-10-09 ENCOUNTER — Encounter: Payer: Self-pay | Admitting: Family Medicine

## 2020-10-09 ENCOUNTER — Telehealth: Payer: Self-pay | Admitting: Family Medicine

## 2020-10-09 ENCOUNTER — Ambulatory Visit
Admission: RE | Admit: 2020-10-09 | Discharge: 2020-10-09 | Disposition: A | Discharge status: Home / Self Care | Payer: Commercial Managed Care - PPO | Source: Ambulatory Visit | Attending: Neurology | Admitting: Neurology

## 2020-10-09 NOTE — Telephone Encounter (Signed)
Letter sent via mychart and will be printed and emailed

## 2020-10-09 NOTE — Telephone Encounter (Signed)
Patient's wife called stating that since Jacob Foster was here and had an injection for his shoulder, he is still having a lot of pain and his tremors seem to be worse. He was not able to go back to work today. They asked if Dr Georgina Snell would be able to write him out of work for the week and he will try to go back on Monday.  Please advise.  Wife asked that we email her the letter ([email or web address]).

## 2020-10-09 NOTE — Telephone Encounter (Signed)
Patient notified and voiced understanding.

## 2020-10-10 ENCOUNTER — Telehealth: Payer: Self-pay

## 2020-10-10 NOTE — Telephone Encounter (Signed)
Patient called stated that she needs Section 1 and section 2 of disability and leave document for [place] to be updated with the 10/05/2020 date to 10/12/2020 and update the date completed to whatever date it is fixed and then whatever was fixed initialed and then re faxed and emailed to [place].

## 2020-10-10 NOTE — Telephone Encounter (Signed)
What document are we talking about regarding [place]?  His last FMLA form?  Dr. Georgina Snell sent a letter yesterday and no new form has been sent to [place].

## 2020-10-10 NOTE — Progress Notes (Signed)
Assessment/Plan:   1.  Parkinsons Disease, diagnosed March, 2022, but suspect that this has been going on for quite some time with missed. Diagnosis  -Patient currently only on carbidopa/levodopa, half tablet 3 times per day.  Told him to continue the titration so he is taking 1 tablet 3 times per day.   -Discussed proper places to find resources.  Would like him to stay off of chat rooms and [employer], but discussed appropriate places such as the [employer], our support groups, [facility]. [place]  -increase exercise.  Resources given  -Patient asked several questions today and answered those to the best of my ability.  2.  Memory change  -I dont' suspect neurodegen but maybe MCI  -we will get neurocog testing done Subjective:   Jacob Foster was seen today in follow up for Parkinsons disease.  My previous records were reviewed prior to todays visit as well as outside records available to me. Pt worked in at his request. Pt with wife supplements the history.   Just seen a few weeks ago and dx with Parkinsons Disease at that time.  Started on levodopa.  He is only taking 1/2 po tid.   Pt denies falls.  Pt denies lightheadedness, near syncope.  No hallucinations.  Mood has been good.  Thinks that his memory isn't as good and asks about getting testing.   MRI brain done and personally reviewed since last visit and was unremarkable.  Current prescribed movement disorder medications: carbidopa/levodopa 25/100 tid   ALLERGIES:  No Known Allergies  CURRENT MEDICATIONS:  Outpatient Encounter Medications as of 10/11/2020  Medication Sig  . carvedilol (COREG) 6.25 MG tablet Take 1 tablet (6.25 mg total) by mouth 2 (two) times daily.  . cetirizine (ZYRTEC) 10 MG tablet Take 1 tablet (10 mg total) by mouth daily.  . hydrochlorothiazide (HYDRODIURIL) 25 MG tablet Take 1 tablet (25 mg total) by mouth daily. Schedule a follow up appointment for refills of medication  . tiZANidine  (ZANAFLEX) 4 MG tablet Take 1 tablet (4 mg total) by mouth every 6 (six) hours as needed for muscle spasms.  . traMADol (ULTRAM) 50 MG tablet Take 1 tablet (50 mg total) by mouth every 8 (eight) hours as needed for severe pain.  . carbidopa-levodopa (SINEMET IR) 25-100 MG tablet Take 1 tablet by mouth 3 (three) times daily. 5am/10am/3pm (Patient not taking: Reported on 10/11/2020)  . [DISCONTINUED] tamsulosin (FLOMAX) 0.4 MG CAPS capsule Take 1 capsule (0.4 mg total) by mouth daily after supper. (Patient not taking: Reported on 10/11/2020)   No facility-administered encounter medications on file as of 10/11/2020.    Objective:   PHYSICAL EXAMINATION:    VITALS:   Vitals:   10/11/20 1513  BP: 110/80  Pulse: 61  SpO2: 99%  Weight: 182 lb (82.6 kg)  Height: 5\' 8"  (1.727 m)    GEN:  The patient appears stated age and is in NAD. HEENT:  Normocephalic, atraumatic.  The mucous membranes are moist. The superficial temporal arteries are without ropiness or tenderness. CV:  RRR Lungs:  CTAB Neck/HEME:  There are no carotid bruits bilaterally.  Neurological examination:  Orientation: The patient is alert and oriented x3. Cranial nerves: There is good facial symmetry with significant facial hypomimia. The speech is fluent and clear. Soft palate rises symmetrically and there is no tongue deviation. Hearing is intact to conversational tone. Sensation: Sensation is intact to light touch throughout Motor: Strength is at least antigravity x4.  Movement examination:  Tone: There is at least moderate tone in the left upper extremity and mild increased in the right upper extremity Abnormal movements: There is mild increased tremor in the left upper extremity (improved since last visit) Coordination:  There is  decremation with RAM's, with any form of RAMS, including alternating supination and pronation of the forearm, hand opening and closing, finger taps, heel taps and toe taps on the left Gait and  Station: The patient has no difficulty arising out of a deep-seated chair without the use of the hands. The patient's stride length is decreased with decreased arm swing on the left.    I have reviewed and interpreted the following labs independently    Chemistry      Component Value Date/Time   NA 139 09/20/2020 1454   K 3.9 09/20/2020 1454   CL 100 09/20/2020 1454   CO2 27 09/20/2020 1454   BUN 17 09/20/2020 1454   CREATININE 1.10 09/20/2020 1454   CREATININE 1.10 11/27/2017 1630      Component Value Date/Time   CALCIUM 10.0 09/20/2020 1454   ALKPHOS 64 09/20/2020 1454   AST 28 09/20/2020 1454   ALT 34 09/20/2020 1454   BILITOT 0.5 09/20/2020 1454       Lab Results  Component Value Date   WBC 6.9 03/28/2019   HGB 16.3 03/28/2019   HCT 48.9 03/28/2019   MCV 87.7 03/28/2019   PLT 289.0 03/28/2019    Lab Results  Component Value Date   TSH 1.28 09/20/2020   No results found for: VITAMINB12   Total time spent on today's visit was 30 minutes, including both face-to-face time and nonface-to-face time.  Time included that spent on review of records (prior notes available to me/labs/imaging if pertinent), discussing treatment and goals, answering patient's questions and coordinating care.  Cc:  Midge Minium, MD

## 2020-10-10 NOTE — Telephone Encounter (Signed)
Form is corrected and left at front desk.

## 2020-10-10 NOTE — Telephone Encounter (Signed)
Jacob Foster stated it was a previous form for disability and leave that was filled out. Wouldn't be a new form just corrections on the last form filled out. I guess it would be the FMLA.

## 2020-10-11 ENCOUNTER — Ambulatory Visit: Payer: Commercial Managed Care - PPO | Admitting: Neurology

## 2020-10-11 ENCOUNTER — Other Ambulatory Visit: Payer: Self-pay

## 2020-10-11 ENCOUNTER — Encounter: Payer: Self-pay | Admitting: Neurology

## 2020-10-11 VITALS — BP 110/80 | HR 61 | Ht 68.0 in | Wt 182.0 lb

## 2020-10-11 DIAGNOSIS — R413 Other amnesia: Secondary | ICD-10-CM | POA: Diagnosis not present

## 2020-10-11 DIAGNOSIS — G20 Parkinson's disease: Secondary | ICD-10-CM

## 2020-10-14 ENCOUNTER — Other Ambulatory Visit: Payer: Self-pay | Admitting: Family Medicine

## 2020-10-14 DIAGNOSIS — I10 Essential (primary) hypertension: Secondary | ICD-10-CM

## 2020-10-15 ENCOUNTER — Telehealth: Payer: Self-pay | Admitting: Family Medicine

## 2020-10-15 NOTE — Telephone Encounter (Signed)
Needs an appotintment for referral

## 2020-10-15 NOTE — Telephone Encounter (Signed)
Patient's wife will call back to get an appointment with Dr. Birdie Riddle.

## 2020-10-15 NOTE — Telephone Encounter (Signed)
Patient would like a neurologist referral - [phone] - please Jacob Foster

## 2020-10-16 ENCOUNTER — Telehealth: Payer: Self-pay | Admitting: Family Medicine

## 2020-10-16 DIAGNOSIS — G20 Parkinson's disease: Secondary | ICD-10-CM

## 2020-10-16 NOTE — Telephone Encounter (Signed)
Patient's wife called stating that they would like to get a second opinion and asked if a referral could be sent over to Dr Tommas Olp at [facility] [place]. [id number] [place], [place] [address] / Phone: [phone] Fax: [phone]

## 2020-10-17 NOTE — Telephone Encounter (Addendum)
Patient's wife called back and said that she would like for the referral to be sent to [facility] also.  [facility]  Fax# [phone]

## 2020-10-17 NOTE — Telephone Encounter (Signed)
Additional referral request

## 2020-10-17 NOTE — Telephone Encounter (Signed)
Referral sent 

## 2020-10-18 ENCOUNTER — Telehealth: Payer: Self-pay | Admitting: Neurology

## 2020-10-18 ENCOUNTER — Ambulatory Visit (INDEPENDENT_AMBULATORY_CARE_PROVIDER_SITE_OTHER): Payer: Commercial Managed Care - PPO | Admitting: Licensed Clinical Social Worker

## 2020-10-18 DIAGNOSIS — G20 Parkinson's disease: Secondary | ICD-10-CM | POA: Diagnosis not present

## 2020-10-18 NOTE — Telephone Encounter (Signed)
Patients wife has been informed

## 2020-10-18 NOTE — Telephone Encounter (Signed)
Done

## 2020-10-18 NOTE — BH Specialist Note (Signed)
Therapist received T/C from wife who has permission to talk with Therapist about Pt needing to connect with others and possible resources in the community for newly diagnosed PD.  Therapist provided active listening and reassurance and brief education on the phone and will follow up with Pt on Friday 4/8 when he is off to discuss further  .

## 2020-10-18 NOTE — Telephone Encounter (Signed)
Patient's wife called in stating he started back to work this week. He is having a hard time working. She wants to talk with someone about what his next steps should be regarding his job.

## 2020-10-18 NOTE — Telephone Encounter (Signed)
faxed

## 2020-10-19 NOTE — Telephone Encounter (Signed)
He was just in the clinic (actually several times in the last few weeks).  We are going to have to address these at visit appointments.  Also, I don't think that he has f/u with his PCP at all since diagnosis so maybe the best place is to start with PCP.

## 2020-10-22 ENCOUNTER — Telehealth: Payer: Self-pay

## 2020-10-22 NOTE — Telephone Encounter (Signed)
Left patient vm to call back to cancel appt with Tabori on 4/12 at 8am and to schedule TOC along with patient concern with Maximiano Coss for this week.  I have gotten an ok to schedule patient in a 40 minute slot with Richard.

## 2020-10-22 NOTE — Telephone Encounter (Signed)
Patient's wife Edsel Petrin called requested a call back from a nurse. She said she'd like to know what the patient can take over the counter for his allergy symptoms in combination with his carbidopa levodopa prescription?   She said the patient did not go to work today due to his symptoms.

## 2020-10-22 NOTE — Telephone Encounter (Signed)
Pt called no answer per DPR a voice mail was left that Dr Tat cannot give him a work note for his allergic symptoms and again he needs to f/u with his pcp.  In Dr tat's medical opinion, these are not side effects of levodopa

## 2020-10-22 NOTE — Telephone Encounter (Signed)
Per pt wife its not seasonal allergies after he takes his carbidopa he starts sneezing, coughing and runny nose. Covid 19 test neg, did start allergy medication yesterday. Pt wife that he is becoming very depressed , asking who he can see or if he can take something for that? He was out of work today may be out all week asking does he need to be see with all of this going on now?

## 2020-10-22 NOTE — Telephone Encounter (Signed)
Asking for you to call her she was advised that she may need an appointment , she stated that she looked up and seen it was a side effect of sinemet she said that this is 100% cause by his medication, she wants to cut the middle man out and just talk to the DR. She also wants to talk about depression.  Asking for a letter for being out of work today. They will call PCP but that's just another middle man

## 2020-10-22 NOTE — Telephone Encounter (Signed)
Spoke with patients wife and gave her Dr Doristine Devoid recommendations.   She states she is concerned because the patient did not have none of these symptoms before starting this medication. She states every time the patient takes this medication it causes a cough. Wife states that the only reason she keeps calling because she is not getting the answers she needs. She wants to know if there is anything the patient can take. She states she read online that the symptoms can be caused by the brand name. She states she called th patients pharmacy and asked what the patient could take and they told her he could try Clartin. She states the clartin is not working. She states she does not want to contact the pcp about a medication that Dr Tat prescribes. She states the coughing only stops is when its time for the next dose.   I advised her that I would speak with Dr Tat and get back to her she voiced understanding.

## 2020-10-22 NOTE — Telephone Encounter (Signed)
yes

## 2020-10-22 NOTE — Telephone Encounter (Signed)
I cannot give him a work note for his allergic symptoms and again he needs to f/u with his pcp.  In my medical opinion, these are not side effects of levodopa.

## 2020-10-22 NOTE — Telephone Encounter (Signed)
I am not "pushing" my issues to the PCP.  I have seen the patient 2 times in the last 2 weeks, which is very frequent for a neuro visit and he has an appt with the social worker here.  I am merely saying in my years of experience with carbidopa/levodopa 25/100 it doesn't cause cough or runny nose or allergic type symptoms and perhaps they need to follow up with PCP for those sx's.  I understand that wife has researched on the internet and disagrees with me.  They can d/c it if they would like but I would not recommend it.

## 2020-10-22 NOTE — Telephone Encounter (Signed)
I have scheduled patient an in office appointment on 4/13 in regard to :  [facility] from [place] has asked patient to follow up with PCP in regard to cough/ sore throat/ sneezing, depression and loss of appetite as a side effect for carbidopa-levodopa (states side effects flare up within 5 minutes after taking dose - then side effects ease off within 4 hours)  tested negative 4/10 for COVID at home.  States pharmacist suggested patient to take clartin (started taking med 4/10 but has not seen relief)  Wife states right now patient is having a lot of coughing with sinus drainage.  Would like to know if Delfino Lovett would suggest patient to try anything else for these symptoms until he is seen on wed?

## 2020-10-22 NOTE — Telephone Encounter (Signed)
Levodopa doesn't cause cough, runny nose.  As previous, he needs to see his PCP.  He hasn't seen her since dx (or ever in person, at least that is what he told me)

## 2020-10-22 NOTE — Telephone Encounter (Signed)
Pt wife called back asking why are we pushing the to the PCP when Dr Tat is the one who put the pt on carbidopa. They are asking if he needs to have a reassessment from Dr Tat not the PCP for his symptoms. They asked if Dr Tat even cares about her pts. Pt wife and pt both informed that Dr Tat dose care for her pt, they then asked if it was certain one, they where advised again she cares for all her pts. Pt and pt wife then stated that not all pt are the same they all react different to medication they stated that he had none of these symptoms before he started the carbidopa. Pt wife stated again that she read his symptoms may not be side effects of the carbidopa but they are listed as side effect of sinemet. They want Dr Tat to call them to talk to them or they want answer that will answer their questions appropriate and not seem like they are being pushed to the PCP. Pt wife stated that he does have an appt scheduled with the PCP.

## 2020-10-23 ENCOUNTER — Telehealth: Payer: Commercial Managed Care - PPO | Admitting: Family Medicine

## 2020-10-24 ENCOUNTER — Ambulatory Visit: Payer: Commercial Managed Care - PPO | Admitting: Registered Nurse

## 2020-10-24 ENCOUNTER — Other Ambulatory Visit: Payer: Self-pay

## 2020-10-24 ENCOUNTER — Encounter: Payer: Self-pay | Admitting: Registered Nurse

## 2020-10-24 VITALS — BP 128/76 | HR 60 | Temp 98.3°F | Resp 15 | Ht 68.0 in | Wt 177.6 lb

## 2020-10-24 DIAGNOSIS — Z8639 Personal history of other endocrine, nutritional and metabolic disease: Secondary | ICD-10-CM | POA: Diagnosis not present

## 2020-10-24 DIAGNOSIS — R0981 Nasal congestion: Secondary | ICD-10-CM

## 2020-10-24 DIAGNOSIS — F419 Anxiety disorder, unspecified: Secondary | ICD-10-CM

## 2020-10-24 DIAGNOSIS — G20 Parkinson's disease: Secondary | ICD-10-CM | POA: Diagnosis not present

## 2020-10-24 DIAGNOSIS — F4323 Adjustment disorder with mixed anxiety and depressed mood: Secondary | ICD-10-CM

## 2020-10-24 DIAGNOSIS — Z13220 Encounter for screening for lipoid disorders: Secondary | ICD-10-CM | POA: Diagnosis not present

## 2020-10-24 DIAGNOSIS — I10 Essential (primary) hypertension: Secondary | ICD-10-CM | POA: Diagnosis not present

## 2020-10-25 ENCOUNTER — Telehealth: Payer: Self-pay

## 2020-10-25 ENCOUNTER — Ambulatory Visit: Payer: Commercial Managed Care - PPO | Admitting: Licensed Clinical Social Worker

## 2020-10-25 DIAGNOSIS — F4321 Adjustment disorder with depressed mood: Secondary | ICD-10-CM | POA: Insufficient documentation

## 2020-10-25 NOTE — Telephone Encounter (Signed)
States there was to have been a nasal decongestant sent to [facility] in [place] after visit yesterday.  Patient has reached out to [facility] and they do not have a medication.  Also states that Jacob Foster was to have reached out patients neurologist to see if patient needed to start back on wellbutrin.  Is requesting a call back in regard to both matters.

## 2020-10-25 NOTE — BH Specialist Note (Signed)
[facility] Initial In-Person Visit  MRN: [id number] Name: Jacob Foster  Number of [place] Clinician visits:: 1/6 Session Start time: 9:30  Session End time: 10:30 Total time: 60 minutes  Types of Service: [place] (BHI)  Interpretor:No. Interpretor Name and Language:    Subjective: Jacob Foster is a 55 y.o. male accompanied by Spouse Patient was referred by Dr. Carles Collet for [facility] Patient reports the following symptoms/concerns: 2 weeks Duration of problem:daily Severity of problem: moderate  Objective: Mood: Negative and Depressed and Affect: Appropriate Risk of harm to self or others: No plan to harm self or others  Life Context: Family and Social: Lives with wife School/Work: Currently works Self-Care:  Life Changes:Recently DX with Parkinson's  Patient and/or Family's Strengths/Protective Factors: Social and Emotional competence  Goals Addressed: Patient will: 1. Reduce symptoms of: depression 2. Increase knowledge and/or ability of: coping skills  3. Demonstrate ability to: Increase healthy adjustment to current life circumstances  Progress towards Goals: Ongoing  Interventions: Interventions utilized: Supportive Counseling  Standardized Assessments completed: Not Needed  Patient and/or Family Response: Appropriate   Patient Centered Plan: Patient is on the following Treatment Plan(s):    Assessment: Patient currently experiencing Feelings of Hopelessness and sadness related to recently diagnosis of Parkinson's disease . Marland Kitchen   Patient may benefit from Further behavioral health counseling and psychtropic medication   Plan: 1. Follow up with behavioral health clinician in one month 2. Behavioral recommendations increase socialization and attend support group .  In addition start gratitude journal and mindfulness exercises  3. Referral(s): [facility] (In Clinic) 4. "From scale  of 1-10, how likely are you to follow plan?":9 per self report   Juleon Narang A Taylor-Paladino, LCSW

## 2020-10-25 NOTE — Telephone Encounter (Signed)
Please advise patient needs the nasal decongestant that was discussed in office visit yesterday 10/24/2020 also discuss getting back on Wellbutrin.

## 2020-10-28 ENCOUNTER — Emergency Department (HOSPITAL_COMMUNITY)
Admission: EM | Admit: 2020-10-28 | Discharge: 2020-10-28 | Disposition: A | Discharge status: Home / Self Care | Payer: Commercial Managed Care - PPO | Attending: Emergency Medicine | Admitting: Emergency Medicine

## 2020-10-28 ENCOUNTER — Other Ambulatory Visit: Payer: Self-pay

## 2020-10-28 ENCOUNTER — Encounter (HOSPITAL_COMMUNITY): Payer: Self-pay

## 2020-10-28 DIAGNOSIS — F322 Major depressive disorder, single episode, severe without psychotic features: Secondary | ICD-10-CM | POA: Insufficient documentation

## 2020-10-28 DIAGNOSIS — Z8616 Personal history of COVID-19: Secondary | ICD-10-CM | POA: Insufficient documentation

## 2020-10-28 DIAGNOSIS — F32A Depression, unspecified: Secondary | ICD-10-CM

## 2020-10-28 DIAGNOSIS — R251 Tremor, unspecified: Secondary | ICD-10-CM | POA: Diagnosis not present

## 2020-10-28 DIAGNOSIS — Z79899 Other long term (current) drug therapy: Secondary | ICD-10-CM | POA: Insufficient documentation

## 2020-10-28 DIAGNOSIS — Z20822 Contact with and (suspected) exposure to covid-19: Secondary | ICD-10-CM | POA: Diagnosis not present

## 2020-10-28 DIAGNOSIS — I10 Essential (primary) hypertension: Secondary | ICD-10-CM | POA: Insufficient documentation

## 2020-10-28 LAB — COMPREHENSIVE METABOLIC PANEL
ALT: 31 U/L (ref 0–44)
AST: 25 U/L (ref 15–41)
Albumin: 4.2 g/dL (ref 3.5–5.0)
Alkaline Phosphatase: 60 U/L (ref 38–126)
Anion gap: 8 (ref 5–15)
BUN: 12 mg/dL (ref 6–20)
CO2: 23 mmol/L (ref 22–32)
Calcium: 9 mg/dL (ref 8.9–10.3)
Chloride: 105 mmol/L (ref 98–111)
Creatinine, Ser: 0.92 mg/dL (ref 0.61–1.24)
GFR, Estimated: >60 mL/min (ref >60)
Glucose, Bld: 92 mg/dL (ref 70–99)
Potassium: 3.9 mmol/L (ref 3.5–5.1)
Sodium: 136 mmol/L (ref 135–145)
Total Bilirubin: 1 mg/dL (ref 0.3–1.2)
Total Protein: 7.6 g/dL (ref 6.5–8.1)

## 2020-10-28 LAB — CBC
HCT: 51.7 % (ref 39.0–52.0)
Hemoglobin: 17.2 g/dL — ABNORMAL HIGH (ref 13.0–17.0)
MCH: 29.2 pg (ref 26.0–34.0)
MCHC: 33.3 g/dL (ref 30.0–36.0)
MCV: 87.6 fL (ref 80.0–100.0)
Platelets: 222 10*3/uL (ref 150–400)
RBC: 5.9 MIL/uL — ABNORMAL HIGH (ref 4.22–5.81)
RDW: 13.5 % (ref 11.5–15.5)
WBC: 5.7 10*3/uL (ref 4.0–10.5)
nRBC: 0 % (ref 0.0–0.2)

## 2020-10-28 LAB — RESP PANEL BY RT-PCR (FLU A&B, COVID) ARPGX2
Influenza A by PCR: NEGATIVE
Influenza B by PCR: NEGATIVE
SARS Coronavirus 2 by RT PCR: NEGATIVE

## 2020-10-28 LAB — ACETAMINOPHEN LEVEL: Acetaminophen (Tylenol), Serum: <10 ug/mL — ABNORMAL LOW (ref 10–30)

## 2020-10-28 LAB — SALICYLATE LEVEL: Salicylate Lvl: <7 mg/dL — ABNORMAL LOW (ref 7.0–30.0)

## 2020-10-28 LAB — RAPID URINE DRUG SCREEN, HOSP PERFORMED
Amphetamines: NOT DETECTED
Barbiturates: NOT DETECTED
Benzodiazepines: NOT DETECTED
Cocaine: NOT DETECTED
Opiates: NOT DETECTED
Tetrahydrocannabinol: NOT DETECTED

## 2020-10-28 LAB — ETHANOL: Alcohol, Ethyl (B): <10 mg/dL (ref <10)

## 2020-10-28 MED ORDER — HYDROXYZINE HCL 25 MG PO TABS: 25.0000 mg | ORAL_TABLET | Freq: Every evening | ORAL | 0 refills | Status: DC | PRN

## 2020-10-28 NOTE — BH Assessment (Addendum)
Disposition: TTS assessment completed by this Clinician. Discussed clinical information with provider Oneida Alar, NP) whom recommended that patient is psychiatrically cleared. Follow up referrals entered into patient's for psychiatric outpatient therapy and medication management  [facility]S.

## 2020-10-28 NOTE — ED Triage Notes (Signed)
Patient arrived with family who states he was started on medications for Parkinson's disease two weeks ago and has had suicidal thoughts over the last few days with no plan, "just wanting it all to end"

## 2020-10-28 NOTE — BH Assessment (Addendum)
Comprehensive Clinical Assessment (CCA) Note   10/28/2020 Jacob Foster [id number]  DISPOSITION: Gave clinical report to Jacob Foster who determined Pt does not meet criteria for inpatient psychiatric treatment. Notified EDP (Jac[facility]) and Nursing Jacob Moos, RN) of disposition recommendations and the sitter utilization recommendation.  Follow up referrals entered into patient's for psychiatric outpatient therapy and medication management  AVS.    The patient demonstrates the following risk factors for suicide: Chronic risk factors for suicide include: psychiatric disorder of Depressive Disorder, Severe. Acute risk factors for suicide include: Recent diagnosis of Parkinson's Disease . Protective factors for this patient include: positive social support and responsibility to others (children, family). Considering these factors, the overall suicide risk at this point appears to be low. Patient is appropriate for outpatient follow up.  Therefore, a 1:1 sitter for suicide precautions is not recommended. The ER MD has been informed through secure chat.  Jacob Foster from 10/28/2020 in [facility] Low Risk     Jacob Foster is a 55 yr old male. He was brought to [facility] by his spouse for a psychiatric evaluation. Patient recently Dx's with Parkinson Disease 2 weeks ago. Patient states, "I'm devastated and I'm still in denial". "I'm very confused about this diagnosis and it's a  rare catastrophic event".    Patient reports suicidal thoughts intermittently since his diagnosis. His most recent thoughts were last night. He reports expressing his thoughts to his spouse of frustration. He has no plan. He has not history of suicidal attempts and/or gestures. Patient verbalized contract for safety today. Patient has access to #2 guns at home. Patient has no history of self-mutilating behaviors. Protective factors that prevent patient from harming himself  is his family.    Depression: Loss of interest in usual pleasures, guilt, anger/irritability, tearful, despondence, hopeless, worthlessness, and insomnia. He reports not sleeping well in the past 2 weeks. However, doesn't know how many hours he is getting per night. Appetite is poor. He reports losing 10 pounds in the past week. However, patient reports that this may a side effect of one of his medications. He denies a history of trauma and/or abuse. He has family history of mental health illness reporting his father and uncle. He is unsure of their specified diagnosis.  Current support system is identified as his spouse. His spouse, two sons, and a daughter live in the same house hold as he does. He is currently employed full time as a Curator. His highest level of education is [employer].   Patient denies homicidal ideaitons. Denies aggressive and /or violent behaviors. He denies legal issues.  He denies AVH's. Patient denies a history of alcohol and/drug use.   Patient reports a history of inpatient psychiatric treatment. Patient was hospitalized in [place], [place] at a psychiatric facility in 1996. The reason for his hospitalization was depression "because I had a lot of financial issues going on". Patient has an appointment with a psychiatrist, [facility], scheduled Nov 15, 2020. He does not have a therapist. His PCP is [facility].   Patient asked how does he feel we could best help him today. States, "I want some medicine to help ease my mind. "I want something that will not cause a counter re-action with my medical medication". Patient states, "I miss my appetite.I can't tell when I'm hungry". "My wife ask me to eat but the desire is gone".  Collateral from Spouse Jacob [facility]) [phone]: Spouse is at bedside. Spouse is concerned  and trying to help him focus more on what he can do vs. what he can't do. Spouse has no safety concerns. She is willing to remove guns in the home as a safety measure.  Discussed safety planning with spouse and patient collaborately.   Chief Complaint:  Chief Complaint  Patient presents with  . Suicidal   Visit Diagnosis: Depressive Disorder, Severe     CCA Screening, Triage and Referral (STR)  Patient Reported Information How did you hear about [place]? Family/Friend  Referral name: Brought to the Emergency Department by family  Referral phone number: [phone])   Whom do you see for routine medical problems? Primary Care  Practice/Facility Name: [facility], [facility]  Practice/Facility Phone Number: [phone])  Name of Contact: Jacob [facility]  Locations  A    [facility] [place]  Contact Number: [phone]  Contact Fax Number: n/a  Prescriber Name: Jacob Courser, MD  Prescriber Address (if known): [address], [place] [address]   What Is the Reason for Your Visit/Call Today? Recent suicidal thoughts and depressive symptoms due to dx's of Parkinsons 2 weeks ago. Patient states that he is in denial and and very confused about his diagnosis. Heis having a difficult time dealing with everything.  How Long Has This Been Causing You Problems? > than 6 months  What Do You Feel Would Help You the Most Today? Medication(s)   Have You Recently Been in Any Inpatient Treatment (Hospital/Detox/Crisis Foster/28-Day Foster)? No  Name/Location of Foster/Hospital:No data recorded How Long Were You There? No data recorded When Were You Discharged? No data recorded  Have You Ever Received Services From [facility] Before? No  Who Do You See at [facility]? No data recorded  Have You Recently Had Any Thoughts About Hurting Yourself? No  Are You Planning to Commit Suicide/Harm Yourself At This time? No   Have you Recently Had Thoughts About [place]? No  Explanation: No data recorded  Have You Used Any Alcohol or Drugs in the Past 24 Hours? No  How Long  Ago Did You Use Drugs or Alcohol? No data recorded What Did You Use and How Much? No data recorded  Do You Currently Have a Therapist/Psychiatrist? No  Name of Therapist/Psychiatrist: No data recorded  Have You Been Recently Discharged From Any Office Practice or Programs? No  Explanation of Discharge From Practice/Foster: No data recorded    CCA Screening Triage Referral Assessment Type of Contact: Tele-Assessment  Is this Initial or Reassessment? Initial Assessment  Date Telepsych consult ordered in [facility]:  10/28/2020  Time Telepsych consult ordered in [facility]:  No data recorded  Patient Reported Information Reviewed? Yes  Patient Left Without Being Seen? No  Reason for Not Completing Assessment: No data recorded  Collateral Involvement: Collateral from Spouse [facility]) #[phone]   Does Patient Have a Court Appointed Legal Guardian? No data recorded Name and Contact of Legal Guardian: No data recorded If Minor and Not Living with Parent(s), Who has Custody? No data recorded Is CPS involved or ever been involved? Never  Is APS involved or ever been involved? Never   Patient Determined To Be At Risk for Harm To Self or Others Based on Review of Patient Reported Information or Presenting Complaint? No  Method: No data recorded Availability of Means: No data recorded Intent: No data recorded Notification Required: No data recorded Additional Information for Danger to Others Potential: No data recorded  Additional Comments for Danger to Others Potential: No data recorded Are There Guns or Other Weapons in [place]? No data recorded Types of Guns/Weapons: No data recorded Are These Weapons Safely Secured?                            No data recorded Who Could Verify You Are Able To Have These Secured: No data recorded Do You Have any Outstanding Charges, Pending Court Dates, Parole/Probation? No data recorded Contacted To Inform of Risk of Harm To Self or Others: No data  recorded  Location of Assessment: [facility]   Does Patient Present under Involuntary Commitment? No  IVC Papers Initial File Date: No data recorded  [place] of Residence: [place]   Patient Currently Receiving the Following Services: -- (Patient does not have a current therapist and/or psychiatrist. He has an upcoming appointment with [facility] for therapist, Nov 15, 2020.)   Determination of Need: No data recorded  Options For Referral: Intensive Outpatient Therapy; Medication Management; Outpatient Therapy     CCA Biopsychosocial Intake/Chief Complaint:  Recent suicidal thoughts and depressive symptoms due to dx's of Parkinsons 2 weeks ago. Patient states that he is in denial and and very confused about his diagnosis. Heis having a difficult time dealing with everything.  Current Symptoms/Problems: Recent suicidal thoughts and depressive symptoms due to dx's of Parkinsons 2 weeks ago. Patient states that he is in denial and and very confused about his diagnosis. Heis having a difficult time dealing with everything.   Patient Reported Schizophrenia/Schizoaffective Diagnosis in Past: No   Strengths: unk  Preferences: unk  Abilities: unk   Type of Services Patient Feels are Needed: unk   Initial Clinical Notes/Concerns: unk   Mental Health Symptoms Depression:  Difficulty Concentrating; Hopelessness; Irritability; Change in energy/activity; Increase/decrease in appetite; Sleep (too much or little); Tearfulness; Weight gain/loss; Worthlessness   Duration of Depressive symptoms: Greater than two weeks   Mania:  Change in energy/activity; Irritability   Anxiety:   Difficulty concentrating; Irritability; Fatigue; Restlessness; Tension; Worrying   Psychosis:  None   Duration of Psychotic symptoms: No data recorded  Trauma:  None   Obsessions:  N/A   Compulsions:  N/A   Inattention:  N/A   Hyperactivity/Impulsivity:  N/A   Oppositional/Defiant Behaviors:   N/A   Emotional Irregularity:  N/A   Other Mood/Personality Symptoms:  depresssed state    Mental Status Exam Appearance and self-care  Stature:  Average   Weight:  Average weight   Clothing:  Neat/clean   Grooming:  Normal   Cosmetic use:  None   Posture/gait:  Normal   Motor activity:  Agitated   Sensorium  Attention:  Normal   Concentration:  Normal   Orientation:  X5   Recall/memory:  Normal   Affect and Mood  Affect:  Appropriate; Depressed; Flat   Mood:  Depressed   Relating  Eye contact:  Normal   Facial expression:  Depressed; Sad; Anxious   Attitude toward examiner:  Cooperative   Thought and Language  Speech flow: Clear and Coherent   Thought content:  Appropriate to Mood and Circumstances   Preoccupation:  Other (Comment)   Hallucinations:  None   Organization:  No data recorded  [employer] of Knowledge:  Average   Intelligence:  Average   Abstraction:  Normal   Judgement:  Normal   Reality Testing:  Adequate   Insight:  Good  Decision Making:  Normal   Social Functioning  Social Maturity:  Responsible   Social Judgement:  Normal   Stress  Stressors:  Other (Comment) (Medical)   Coping Ability:  Normal   Skill Deficits:  Self-care   Supports:  Family; Friends/Service system     Religion: Religion/Spirituality Are You A Religious Person?:  (unknown) How Might This Affect Treatment?: unknown  Leisure/Recreation: Leisure / Recreation Do You Have Hobbies?:  (unknown)  Exercise/Diet: Exercise/Diet Do You Exercise?:  (unknown) Have You Gained or Lost A Significant Amount of Weight in the Past Six Months?: Yes-Lost Number of Pounds Lost?: 10 (10 pounds in the past week due to loss of appetite as the result of being placed on new meds for Parkinsons) Do You Follow a Special Diet?: No Do You Have Any Trouble Sleeping?: No   CCA Employment/Education Employment/Work Situation: Employment / Work  Situation Employment situation: Employed 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 employed?: unknown Patient's job has been impacted by current illness: Yes Describe how patient's job has been impacted: Parkinsons Disease and recent mental health issues since diagnosis What is the longest time patient has a held a job?: unknown Where was the patient employed at that time?: unknown Has patient ever been in the [employer]?:  (unknown)  Education: Education Is Patient Currently Attending School?: No Did Teacher, adult education From [employer]?:  (unknown) Did Physicist, medical?:  (unknown) Did You Attend Graduate School?:  (unknown) Did You Have An Individualized Education [facility] (IIEP): No Did You Have Any Difficulty At [employer]?: No Patient's Education Has Been Impacted by Current Illness: No   CCA Family/Childhood History Family and Relationship History: Family history Marital status: Married Number of Years Married:  (unk) What types of issues is patient dealing with in the relationship?: n/a Additional relationship information: unknown Are you sexually active?:  (unknown) What is your sexual orientation?: unknown Has your sexual activity been affected by drugs, alcohol, medication, or emotional stress?: unknown Does patient have children?: Yes How many children?:  (3) How is patient's relationship with their children?: unknown  Childhood History:  Childhood History By whom was/is the patient raised?: Other (Comment) (unknown) Additional childhood history information: unknown Description of patient's relationship with caregiver when they were a child: unknown Patient's description of current relationship with people who raised him/her: unknown How were you disciplined when you got in trouble as a child/adolescent?: unknown Does patient have siblings?:  (unknown) Did patient suffer any verbal/emotional/physical/sexual abuse as a child?:  No Did patient suffer from severe childhood neglect?: No Has patient ever been sexually abused/assaulted/raped as an adolescent or adult?: No Was the patient ever a victim of a crime or a disaster?: No Witnessed domestic violence?: No Has patient been affected by domestic violence as an adult?: No  Child/Adolescent Assessment:     CCA Substance Use Alcohol/Drug Use: Alcohol / Drug Use Pain Medications: SEE MAR Prescriptions: SEE MAR Over the Counter: SEE MAR History of alcohol / drug use?: No history of alcohol / drug abuse Longest period of sobriety (when/how long): n/a                         ASAM's:  Six Dimensions of Multidimensional Assessment  Dimension 1:  Acute Intoxication and/or Withdrawal Potential:      Dimension 2:  Biomedical Conditions and Complications:      Dimension 3:  Emotional, Behavioral, or Cognitive Conditions and Complications:  Dimension 4:  Readiness to Change:     Dimension 5:  Relapse, Continued use, or Continued Problem Potential:     Dimension 6:  Recovery/Living Environment:     ASAM Severity Score:    ASAM Recommended Level of Treatment:     Substance use Disorder (SUD)    Recommendations for Services/Supports/Treatments:    DSM5 Diagnoses: Patient Active Problem List   Diagnosis Date Noted  . Adjustment disorder with depressed mood 10/25/2020  . Parkinson's disease ([place]) 09/20/2020  . Essential hypertension 12/16/2019  . Lateral epicondylitis of right elbow 10/31/2019  . COVID-19 virus infection 03/15/2019  . Family history of colon cancer 12/08/2018  . Serrated polyp of colon 12/08/2018  . Gastroesophageal reflux disease without esophagitis 08/20/2018  . GAD (generalized anxiety disorder) 04/23/2018  . Seasonal affective disorder ([place]) 04/23/2018  . Overweight (BMI 25.0-29.9) 09/05/2017    Patient Centered Plan: Patient is on the following Treatment Plan(s):  Depression   Referrals to Alternative  Service(s): Referred to Alternative Service(s):   Place:   Date:   Time:    Referred to Alternative Service(s):   Place:   Date:   Time:    Referred to Alternative Service(s):   Place:   Date:   Time:    Referred to Alternative Service(s):   Place:   Date:   Time:     Waldon Merl, Counselor

## 2020-10-28 NOTE — Discharge Instructions (Addendum)
Continue taking home medications as prescribed. You may try the hydroxyzine at night as needed for assistance with sleep and depression. Follow with your primary care doctor scheduled appointment tomorrow.  Follow-up with psychiatric doctor as scheduled.  There is also information about another behavioral health center that she can follow-up with. Return to the emergency room if you develop worsening suicidal thoughts, attempt to hurt yourself, or any new, worsening or concerning symptoms.

## 2020-10-28 NOTE — ED Provider Notes (Signed)
[facility] DEPT Provider Note   CSN: [id number] Arrival date & time: 10/28/20  4650     History Chief Complaint  Patient presents with  . Suicidal    Jacob Foster is a 55 y.o. male presenting for evaluation of worsening depression.  Patient states 2 weeks ago he was diagnosed with Parkinson's.  He started on medication for this.  Since then, he has had worsening depression.  This is extra bad at night, states he is having difficulty sleeping.  Last night he became so frustrated he stated that he wanted to die, however he did did not have a plan.  He is not currently suicidal and does not currently have a plan on how he would hurt himself.  He lives with his wife, who is in the room and assisting with history giving.  Patient reports he does have a history of depression, used to be on Wellbutrin but this was discontinued many years ago.  He is not on anything currently.  He has an outpatient appointment scheduled with psychiatry in May.  Patient states he cannot stop thinking about this diagnosis and feels his mind is racing about it.  He denies homicidal thoughts or auditory or visual hallucinations.  HPI     Past Medical History:  Diagnosis Date  . Abscess of axilla   . Arthritis   . Depression    seasonal affective D/O   . Family history of colon cancer 12/08/2018   Father and brother. Sees Dr. Enis Gash  . GERD (gastroesophageal reflux disease)   . Hidradenitis suppurativa   . History of chickenpox   . HTN (hypertension)   . Hyperlipemia   . Medical history non-contributory   . Neck pain   . Serrated polyp of colon 12/08/2018  . Sleep apnea     Patient Active Problem List   Diagnosis Date Noted  . Adjustment disorder with depressed mood 10/25/2020  . Parkinson's disease ([place]) 09/20/2020  . Essential hypertension 12/16/2019  . Lateral epicondylitis of right elbow 10/31/2019  . COVID-19 virus infection 03/15/2019  . Family history of colon  cancer 12/08/2018  . Serrated polyp of colon 12/08/2018  . Gastroesophageal reflux disease without esophagitis 08/20/2018  . GAD (generalized anxiety disorder) 04/23/2018  . Seasonal affective disorder ([place]) 04/23/2018  . Overweight (BMI 25.0-29.9) 09/05/2017    Past Surgical History:  Procedure Laterality Date  . ABSCESS DRAINAGE    . ELBOW SURGERY         Family History  Problem Relation Age of Onset  . Hypertension Mother   . Hyperlipidemia Mother   . Breast cancer Mother   . Prostate cancer Father   . Kidney disease Father   . Colon cancer Father        in his 25's  . Throat cancer Brother        dies age 73 - alcoholic   . Hypertension Brother   . Esophageal cancer Brother   . Healthy Daughter   . Healthy Son   . Healthy Son   . Healthy Son   . Diabetes Brother   . Colon cancer Brother 66  . Colon cancer Brother 27  . Colon cancer Maternal Uncle   . Colon cancer Maternal Uncle   . Colon polyps Neg Hx   . Stomach cancer Neg Hx   . Rectal cancer Neg Hx     Social History   Tobacco Use  . Smoking status: Never Smoker  . Smokeless tobacco: Never Used  Vaping Use  . Vaping Use: Never used  Substance Use Topics  . Alcohol use: No  . Drug use: No    Home Medications Prior to Admission medications   Medication Sig Start Date End Date Taking? Authorizing Provider  carbidopa-levodopa (SINEMET IR) 25-100 MG tablet Take 1 tablet by mouth 3 (three) times daily. 5am/10am/3pm Patient taking differently: Take 1 tablet by mouth 3 (three) times daily. 0700, 1200, 1700 09/20/20  Yes Tat, Eustace Quail, DO  hydrOXYzine (ATARAX/VISTARIL) 25 MG tablet Take 1 tablet (25 mg total) by mouth at bedtime as needed. 10/28/20  Yes Shakir Petrosino, PA-C  naproxen sodium (ALEVE) 220 MG tablet Take 440 mg by mouth daily as needed (pain).   Yes [provider]  carvedilol (COREG) 6.25 MG tablet Take 1 tablet (6.25 mg total) by mouth 2 (two) times daily. 09/11/20   Midge Minium, MD  cetirizine (ZYRTEC) 10 MG tablet Take 1 tablet (10 mg total) by mouth daily. Patient not taking: Reported on 10/28/2020 08/27/20   Midge Minium, MD  hydrochlorothiazide (HYDRODIURIL) 25 MG tablet TAKE 1 TABLET(25 MG) BY MOUTH DAILY Patient not taking: Reported on 10/28/2020 10/15/20   Midge Minium, MD  tiZANidine (ZANAFLEX) 4 MG tablet Take 1 tablet (4 mg total) by mouth every 6 (six) hours as needed for muscle spasms. Patient not taking: Reported on 10/28/2020 09/07/20   Gregor Hams, MD  traMADol (ULTRAM) 50 MG tablet Take 1 tablet (50 mg total) by mouth every 8 (eight) hours as needed for severe pain. Patient not taking: Reported on 10/28/2020 12/13/19   Gregor Hams, MD    Allergies    Patient has no known allergies.  Review of Systems   Review of Systems  Psychiatric/Behavioral: Positive for dysphoric mood.  All other systems reviewed and are negative.   Physical Exam Updated Vital Signs BP 139/85   Pulse 75   Temp 98.2 F (36.8 C) (Oral)   Resp 18   SpO2 95%   Physical Exam Vitals and nursing note reviewed.  Constitutional:      General: He is not in acute distress.    Appearance: He is well-developed.     Comments: Resting in the bed in NAD  HENT:     Head: Normocephalic and atraumatic.  Eyes:     Extraocular Movements: Extraocular movements intact.     Conjunctiva/sclera: Conjunctivae normal.     Pupils: Pupils are equal, round, and reactive to light.  Cardiovascular:     Rate and Rhythm: Normal rate and regular rhythm.     Pulses: Normal pulses.  Pulmonary:     Effort: Pulmonary effort is normal. No respiratory distress.     Breath sounds: Normal breath sounds. No wheezing.  Abdominal:     General: There is no distension.     Palpations: Abdomen is soft. There is no mass.     Tenderness: There is no abdominal tenderness. There is no guarding or rebound.  Musculoskeletal:        General: Normal range of motion.     Cervical back:  Normal range of motion and neck supple.     Comments: Mild tremors noted  Skin:    General: Skin is warm and dry.     Capillary Refill: Capillary refill takes less than 2 seconds.  Neurological:     Mental Status: He is alert and oriented to person, place, and time.  Psychiatric:        Attention and Perception:  He does not perceive auditory or visual hallucinations.        Mood and Affect: Affect is flat.        Speech: He is noncommunicative.        Behavior: Behavior is withdrawn.        Thought Content: Thought content includes suicidal (resolved) ideation. Thought content does not include homicidal ideation. Thought content does not include homicidal or suicidal plan.     ED Results / Procedures / Treatments   Labs (all labs ordered are listed, but only abnormal results are displayed) Labs Reviewed  CBC - Abnormal; Notable for the following components:      Result Value   RBC 5.90 (*)    Hemoglobin 17.2 (*)    All other components within normal limits  SALICYLATE LEVEL - Abnormal; Notable for the following components:   Salicylate Lvl <3.5 (*)    All other components within normal limits  ACETAMINOPHEN LEVEL - Abnormal; Notable for the following components:   Acetaminophen (Tylenol), Serum <10 (*)    All other components within normal limits  RESP PANEL BY RT-PCR (FLU A&B, COVID) ARPGX2  COMPREHENSIVE METABOLIC PANEL  ETHANOL  RAPID URINE DRUG SCREEN, HOSP PERFORMED    EKG None  Radiology No results found.  Procedures Procedures   Medications Ordered in ED Medications - No data to display  ED Course  I have reviewed the triage vital signs and the nursing notes.  Pertinent labs & imaging results that were available during my care of the patient were reviewed by me and considered in my medical decision making (see chart for details).    MDM Rules/Calculators/A&P                          Patient presenting for evaluation of worsening depression.  On exam,  patient appears nontoxic.  Doubt underlying medical cause that is contributing to this, however will obtain screening labs.  Patient's worsening depression is likely secondary to his recent diagnosis of Parkinson's.  In the setting of worsening depression however, will consult with TTS.  Labs interpreted by me, overall reassuring.  Patient is medically cleared for  [facility] team evaluated the patient, they do not feel he is a danger to himself or others and recommends discharge at this time.  They have given further recommendations.  I discussed with Brooke :eevy-Johnson, APP with behavioral health about options for as needed medication until patient can follow-up with primary care/psychiatry.  She is agreeable to starting patient on hydroxyzine nightly as needed.  Discussed findings and plan with patient and wife, who are agreeable.  At this time, patient appears safe for discharge.  Return precautions given.  Patient states he understands and agrees plan.  Final Clinical Impression(s) / ED Diagnoses Final diagnoses:  Depression, unspecified depression type    Rx / DC Orders ED Discharge Orders         Ordered    hydrOXYzine (ATARAX/VISTARIL) 25 MG tablet  At bedtime PRN        10/28/20 1042           Destynie Toomey, PA-C 10/28/20 1232    Tegeler, Gwenyth Allegra, MD 10/28/20 1422

## 2020-10-29 ENCOUNTER — Telehealth: Payer: Self-pay

## 2020-10-29 ENCOUNTER — Encounter: Payer: Self-pay | Admitting: Registered Nurse

## 2020-10-29 MED ORDER — AZELASTINE-FLUTICASONE 137-50 MCG/ACT NA SUSP
1.0000 | Freq: Two times a day (BID) | NASAL | 2 refills | Status: DC
Start: 2020-10-29 — End: 2021-07-17

## 2020-10-29 NOTE — Telephone Encounter (Signed)
Letter completed asking he not return to work until 04/25/21 or further evaluation

## 2020-10-29 NOTE — Telephone Encounter (Signed)
Pt seen in ED yesterday for depression, Wife states this has gotten worse he has lost his appetite and she is concerned, asking if he can be seen sooner by [facility] given his continued decline.   Also would like to know when you have spoken to Neurology.   I did discuss the medication, Disability paperwork and the referrals with pt wife and she seemed calmer after having more information and just requested we follow up.

## 2020-10-29 NOTE — Telephone Encounter (Signed)
Jacob Foster seen last week for Disability paperwork and establish care visit. Pt paperwork was completed and faxed 10/25/2020. Confirmation or success received.   Now waiting to hear from Neurology about plan of care moving forward, they will contact [name] to discuss.    Also called pharmacy to verify and they have received the nasal spray for the pt and will fill order now.   Called pt cell at [phone] at 10:00 am to discuss this with pt but had to leave VM requesting call back.

## 2020-10-29 NOTE — Telephone Encounter (Signed)
Please advise 

## 2020-10-29 NOTE — Telephone Encounter (Signed)
Mr Bellina seen last week for Disability paperwork and establish care visit. Pt paperwork was completed and faxed 10/25/2020. Confirmation or success received.   Now waiting to hear from Neurology about plan of care moving forward, they will contact [name] to discuss.    Also called pharmacy to verify and they have received the nasal spray for the pt and will fill order now.

## 2020-10-29 NOTE — Telephone Encounter (Signed)
Regarding concerns:  - Cannot return to work until we feel it is safe to do so. This is not for the forseeable future but there is potential down the line. A work note through 04/25/21 is great. We can continue to work on forms including disability.  - Will send message to Dr. Carles Collet this morning asking again about AEs - Have placed referral to behavioral health, will place once to psychiatry as well for this patient.  Thank you  Rich

## 2020-10-29 NOTE — Telephone Encounter (Signed)
Pt's wife called in again, asking for an update on the FMLA forms and medication for the decongestant. She also wanted a letter for return to work for pt. She states that she was told by Delfino Lovett that he would call her back on Thursday. She also wanted to know about the update on the Wellbutrin.   Please call her back before the end of the day today.

## 2020-10-29 NOTE — Progress Notes (Signed)
Established Patient Office Visit  Subjective:  Patient ID: Jacob Foster, male    DOB: December 27, 1965  Age: 55 y.o. MRN: [id number]  CC:  Chief Complaint  Patient presents with  . Establish Care    Pt here for [facility], neurology dx pt with parkinsons disease, reports started carvedopa last week but notes when takes this has had sneezing, coughing, water eyes, has tried claratin, and now pt wife reports he has become depressed     HPI Jacob Foster presents for visit to est care.  Concern today for new dx of Parkinson's Disease.  Long workup with neuro after 3-4 years of symptoms including mental health concerns, muscle aches, weakness, and tremors.  Started carvedopa-levodopa  Last week. Seems that tremors are getting worse rather than better, and that he's having side effects of sinus congestion and sore throat. General upper respiratory symptoms do not seem to be reminiscent of past URI or seasonal allergies. Does note that he had COVID in early 2020 but did not have long hauler symptoms. Has been taking claritin without relief.  He does acknowledge the mental health toll that this has taken on him - he is very anxious and concerned about his new diagnosis and the impacts it will have on his life. 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 Additionally, he had liked to stay active but is worried that he will not be able to do so going forward.  Here with his wife today who has been supportive of him throughout Neurologist is Dr. Carles Collet - though they are seeking a second opinion. They have enjoyed the care relationship with Dr. Carles Collet but want to ensure they're doing everything they can to help his situation.  Past Medical History:  Diagnosis Date  . Abscess of axilla   . Arthritis   . Depression    seasonal affective D/O   . Family history of colon cancer 12/08/2018   Father and brother. Sees Dr. Enis Gash  . GERD  (gastroesophageal reflux disease)   . Hidradenitis suppurativa   . History of chickenpox   . HTN (hypertension)   . Hyperlipemia   . Medical history non-contributory   . Neck pain   . Serrated polyp of colon 12/08/2018  . Sleep apnea     Past Surgical History:  Procedure Laterality Date  . ABSCESS DRAINAGE    . ELBOW SURGERY      Family History  Problem Relation Age of Onset  . Hypertension Mother   . Hyperlipidemia Mother   . Breast cancer Mother   . Prostate cancer Father   . Kidney disease Father   . Colon cancer Father        in his 11's  . Throat cancer Brother        dies age 67 - alcoholic   . Hypertension Brother   . Esophageal cancer Brother   . Healthy Daughter   . Healthy Son   . Healthy Son   . Healthy Son   . Diabetes Brother   . Colon cancer Brother 63  . Colon cancer Brother 20  . Colon cancer Maternal Uncle   . Colon cancer Maternal Uncle   . Colon polyps Neg Hx   . Stomach cancer Neg Hx   . Rectal cancer Neg Hx     Social History   Socioeconomic History  . Marital status: Married    Spouse name:  Karma  . Number of children: 4  . Years of education: Not on file  . Highest education level: Not on file  Occupational History  . Occupation: Magazine features editor: [employer]  Tobacco Use  . Smoking status: Never Smoker  . Smokeless tobacco: Never Used  Vaping Use  . Vaping Use: Never used  Substance and Sexual Activity  . Alcohol use: No  . Drug use: No  . Sexual activity: Yes  Other Topics Concern  . Not on file  Social History Narrative   Originally from [facility]   Social Determinants of Health   Financial Resource Strain: Not on file  Food Insecurity: Not on file  Transportation Needs: Not on file  Physical Activity: Not on file  Stress: Not on file  Social Connections: Not on file  Intimate Partner Violence: Not on file    Outpatient Medications Prior to Visit  Medication Sig Dispense Refill  . carbidopa-levodopa  (SINEMET IR) 25-100 MG tablet Take 1 tablet by mouth 3 (three) times daily. 5am/10am/3pm (Patient taking differently: Take 1 tablet by mouth 3 (three) times daily. 0700, 1200, 1700) 270 tablet 1  . carvedilol (COREG) 6.25 MG tablet Take 1 tablet (6.25 mg total) by mouth 2 (two) times daily. 180 tablet 0  . cetirizine (ZYRTEC) 10 MG tablet Take 1 tablet (10 mg total) by mouth daily. (Patient not taking: Reported on 10/28/2020) 30 tablet 11  . hydrochlorothiazide (HYDRODIURIL) 25 MG tablet TAKE 1 TABLET(25 MG) BY MOUTH DAILY (Patient not taking: Reported on 10/28/2020) 30 tablet 0  . tiZANidine (ZANAFLEX) 4 MG tablet Take 1 tablet (4 mg total) by mouth every 6 (six) hours as needed for muscle spasms. (Patient not taking: Reported on 10/28/2020) 30 tablet 1  . traMADol (ULTRAM) 50 MG tablet Take 1 tablet (50 mg total) by mouth every 8 (eight) hours as needed for severe pain. (Patient not taking: Reported on 10/28/2020) 5 tablet 0   No facility-administered medications prior to visit.    No Known Allergies  ROS Review of Systems Per hpi     Objective:    Physical Exam Constitutional:      General: He is not in acute distress.    Appearance: Normal appearance. He is normal weight. He is not ill-appearing, toxic-appearing or diaphoretic.  Cardiovascular:     Rate and Rhythm: Normal rate and regular rhythm.     Heart sounds: Normal heart sounds. No murmur heard. No friction rub. No gallop.   Pulmonary:     Effort: Pulmonary effort is normal. No respiratory distress.     Breath sounds: Normal breath sounds. No stridor. No wheezing, rhonchi or rales.  Chest:     Chest wall: No tenderness.  Neurological:     General: No focal deficit present.     Mental Status: He is alert and oriented to person, place, and time. Mental status is at baseline.  Psychiatric:        Mood and Affect: Mood normal.        Behavior: Behavior normal.        Thought Content: Thought content normal.        Judgment:  Judgment normal.     BP 128/76   Pulse 60   Temp 98.3 F (36.8 C) (Temporal)   Resp 15   Ht 5\' 8"  (1.727 m)   Wt 177 lb 9.6 oz (80.6 kg)   SpO2 98%   BMI 27.00 kg/m  Wt Readings from Last  3 Encounters:  10/24/20 177 lb 9.6 oz (80.6 kg)  10/11/20 182 lb (82.6 kg)  10/05/20 184 lb (83.5 kg)     There are no preventive care reminders to display for this patient.  There are no preventive care reminders to display for this patient.  Lab Results  Component Value Date   TSH 1.28 09/20/2020   Lab Results  Component Value Date   WBC 5.7 10/28/2020   HGB 17.2 (H) 10/28/2020   HCT 51.7 10/28/2020   MCV 87.6 10/28/2020   PLT 222 10/28/2020   Lab Results  Component Value Date   NA 136 10/28/2020   K 3.9 10/28/2020   CO2 23 10/28/2020   GLUCOSE 92 10/28/2020   BUN 12 10/28/2020   CREATININE 0.92 10/28/2020   BILITOT 1.0 10/28/2020   ALKPHOS 60 10/28/2020   AST 25 10/28/2020   ALT 31 10/28/2020   PROT 7.6 10/28/2020   ALBUMIN 4.2 10/28/2020   CALCIUM 9.0 10/28/2020   ANIONGAP 8 10/28/2020   GFR 77.19 09/20/2020   Lab Results  Component Value Date   CHOL 231 (H) 12/08/2018   Lab Results  Component Value Date   HDL 36.20 (L) 12/08/2018   Lab Results  Component Value Date   LDLCALC 156 (H) 12/08/2018   Lab Results  Component Value Date   TRIG 197.0 (H) 12/08/2018   Lab Results  Component Value Date   CHOLHDL 6 12/08/2018   No results found for: HGBA1C    Assessment & Plan:   Problem List Items Addressed This Visit      Cardiovascular and Mediastinum   Essential hypertension - Primary     Nervous and Auditory   Parkinson's disease ([place])    Other Visit Diagnoses    Lipid screening       History of elevated glucose       Adjustment disorder with mixed anxiety and depressed mood       Relevant Orders   Ambulatory referral to Psychology   Sinus congestion       Relevant Medications   Azelastine-Fluticasone 137-50 MCG/ACT SUSP   Anxiety        Relevant Orders   Ambulatory referral to Psychology      Meds ordered this encounter  Medications  . Azelastine-Fluticasone 137-50 MCG/ACT SUSP    Sig: Place 1 spray into the nose every 12 (twelve) hours.    Dispense:  23 g    Refill:  2    Order Specific Question:   Supervising Provider    Answer:   Carlota Raspberry, JEFFREY R [[id number]]    Follow-up: Return in about 2 months (around 12/24/2020) for follow up Parkinsons.   PLAN  Will reach out to Dr. Carles Collet to discuss restarting Wellbutrin and the AEs he is attributing to Sinemet - there is some data to suggest these could be related, but suspicious as well of seasonal rhinitis or URI. Will give nasal spray for relief in interim  Refer to counseling. Could benefit greatly from this  Discussed typical disease course with patient and his wife. While he is experiencing substantial resting tremor at this time, his treatment is not yet to an ideal place and he stands to see some improvement or at least a slow in decline. Discussed that controlling progression is mostly about preservative treatment and symptom control rather than restorative / curative.   Return in 2 mo to check on chronic conditions  Patient encouraged to call clinic with any questions, comments, or concerns.  Maximiano Coss, NP

## 2020-10-29 NOTE — Telephone Encounter (Signed)
Pt wife called back and discussed concerns will also be sending out letter to [place] stating he is processing Disability and will be reevaluated 04/25/2021 (6 month disability forms filled out)  Wife asked that we also check into behavioral specialist as they cannot see him for some time

## 2020-11-01 NOTE — Telephone Encounter (Signed)
Have you heard back from Dr Tat? Pt following up

## 2020-11-07 NOTE — Telephone Encounter (Signed)
Have discussed with Dr. Carles Collet -   The Sinemet is likely not to blame for his symptoms at this stage. Favor acid reflux and allergies. Unfortunately he is too early in his disease to be eligible for other treatment options. I do think his anxiety is greatly impacting his symptoms and that he will benefit tremendously from therapy and a consult with psychiatry, which are each in process  Thank you  Rich

## 2020-11-08 NOTE — Telephone Encounter (Signed)
Called and left message to return call if still would like to discuss this but referral should be calling soon to hopefully get scheduled and assist pt with concerns

## 2020-11-15 ENCOUNTER — Ambulatory Visit: Payer: Commercial Managed Care - PPO | Admitting: Psychologist

## 2020-11-16 ENCOUNTER — Ambulatory Visit: Payer: Commercial Managed Care - PPO | Admitting: Family Medicine

## 2020-11-22 ENCOUNTER — Ambulatory Visit: Payer: Commercial Managed Care - PPO | Admitting: Licensed Clinical Social Worker

## 2020-11-26 ENCOUNTER — Encounter: Payer: Self-pay | Admitting: Neurology

## 2020-11-29 ENCOUNTER — Ambulatory Visit: Payer: Commercial Managed Care - PPO | Admitting: Psychologist

## 2020-11-29 ENCOUNTER — Telehealth: Payer: Self-pay | Admitting: Neurology

## 2020-11-29 NOTE — Telephone Encounter (Signed)
Patient dismissed from [facility] by Wells Guiles Tat, DO, effective 11/26/20. Dismissal Letter sent out by 1st class mail. [employer]

## 2020-11-30 ENCOUNTER — Telehealth: Payer: Self-pay | Admitting: Registered Nurse

## 2020-11-30 NOTE — Telephone Encounter (Signed)
I have this paperwork from the bin up front.

## 2020-11-30 NOTE — Telephone Encounter (Signed)
..  Type of form received:  Disability  Additional comments:   Received by:  Tye Savoy should be Faxed to:  [phone]  Form should be mailed to:    Is patient requesting call for pickup:   Form placed:  In [employer] bin  [employer].  Provider will determine charge.  Individual made aware of 3-5 business day turn around (Y/N)?

## 2020-12-03 NOTE — Telephone Encounter (Signed)
Wife would like a phone call once these forms have been completed and faxed. She also states that Delfino Lovett can just update the previous form.

## 2020-12-04 ENCOUNTER — Ambulatory Visit (INDEPENDENT_AMBULATORY_CARE_PROVIDER_SITE_OTHER): Payer: Commercial Managed Care - PPO | Admitting: Counselor

## 2020-12-04 ENCOUNTER — Ambulatory Visit: Payer: Commercial Managed Care - PPO

## 2020-12-04 ENCOUNTER — Other Ambulatory Visit: Payer: Self-pay

## 2020-12-04 ENCOUNTER — Encounter: Payer: Self-pay | Admitting: Counselor

## 2020-12-04 DIAGNOSIS — F329 Major depressive disorder, single episode, unspecified: Secondary | ICD-10-CM

## 2020-12-04 DIAGNOSIS — F09 Unspecified mental disorder due to known physiological condition: Secondary | ICD-10-CM

## 2020-12-04 DIAGNOSIS — F32A Depression, unspecified: Secondary | ICD-10-CM

## 2020-12-04 DIAGNOSIS — G20 Parkinson's disease: Secondary | ICD-10-CM

## 2020-12-04 DIAGNOSIS — G3184 Mild cognitive impairment, so stated: Secondary | ICD-10-CM | POA: Diagnosis not present

## 2020-12-04 DIAGNOSIS — F0670 Mild neurocognitive disorder due to known physiological condition without behavioral disturbance: Secondary | ICD-10-CM

## 2020-12-04 NOTE — Progress Notes (Signed)
   Psychometrist Note   Cognitive testing was administered to [employer] by Lamar Benes, B.S. Environmental education officer) under the supervision of Alphonzo Severance, Psy.D., ABN. Mr. Bartoli was able to tolerate all test procedures. Dr. Nicole Kindred met with the patient as needed to manage any emotional reactions to the testing procedures. Rest breaks were offered.    The battery of tests administered was selected by Dr. Nicole Kindred with consideration to the patient's current level of functioning, the nature of his symptoms, emotional and behavioral responses during the interview, level of literacy, observed level of motivation/effort, and the nature of the referral question. This battery was communicated to the psychometrist. Communication between Dr. Nicole Kindred and the psychometrist was ongoing throughout the evaluation and Dr. Nicole Kindred was immediately accessible at all times. Dr. Nicole Kindred provided supervision to the technician on the date of this service, to the extent necessary to assure the quality of all services provided.    Mr. Morton will return in approximately one week for an interactive feedback session with Dr. Nicole Kindred, at which time test performance, clinical impressions, and treatment recommendations will be reviewed in detail. The patient understands he can contact our office should he require our assistance before this time.   A total of 115 minutes of billable time were spent with [name] by the technician, including test administration and scoring time. Billing for these services is reflected in Dr. Les Pou note.   This note reflects time spent with the psychometrician and does not include test scores, clinical history, or any interpretations made by Dr. Nicole Kindred. The full report will follow in a separate note.

## 2020-12-04 NOTE — Progress Notes (Signed)
[facility]  Patient Name: Jacob Foster MRN: [id number] Date of Birth: 12-Mar-1966 Age: 55 y.o. Education: 12 years  Measurement properties of test scores: IQ, Index, and Standard Scores (SS): Mean = 100; Standard Deviation = 15 Scaled Scores (Ss): Mean = 10; Standard Deviation = 3 Z scores (Z): Mean = 0; Standard Deviation = 1 T scores (T); Mean = 50; Standard Deviation = 10  TEST SCORES:    Note: This summary of test scores accompanies the interpretive report and should not be interpreted by unqualified individuals or in isolation without reference to the report. Test scores are relative to age, gender, and educational history as available and appropriate.   Performance Validity        ACS: Raw Descriptor      Word Choice: 48 Within Expectation      The Dot Counting Test: Raw Descriptor      E-Score 12 Within Expectation      Embedded Measures: Raw Descriptor      RBANS Effort Index: 3 Within Expectation      WAIS-IV Reliable Digit Span 8 Within Expectation      WAIS-IV Reliable Digit Span Revised 11 Within Expectation      Mental Status Screening     Total Score Descriptor  MMSE 24 MCI      Expected Functioning        Wide Range Achievement Test (Word Reading): Standard/Scaled Score Percentile       Word Reading 82 12      Reynolds Intellectual Screening Test Standard/T-score Percentile      Guess What 40 16      Odd Item Out 56 73  RIST Index 97 42      Cognitive Testing        RBANS, Form : Standard/Scaled Score Percentile  Total Score 71 3  Immediate Memory 61 <1      List Learning 5 5      Story Memory 2 <1  Visuospatial/Constructional 105 63      Figure Copy   (18) 10 50      Judgment of Line Orientation   (18) --- 51-75  Language 90 25      Picture Naming --- 17-25      Semantic Fluency 7 16  Attention 75 5      Digit Span 6 9      Coding 6 9  Delayed Memory 56 <1      List Recall   (0) --- <2      List  Recognition   (14) --- <2      Story Recall   (1) 1 <1      Figure Recall   (13) 9 37      Wechsler Adult Intelligence Scale - IV: Standard/Scaled Score Percentile  Working Memory Index 89 23      Digit Span 8 25          Digit Span Forward 11 63          Digit Span Backward 7 16          Digit Span Sequencing 7 16      Arithmetic 8 25  Processing Speed Index 79 8      Symbol Search 7 16      Coding 5 5      Neuropsychological Assessment Battery (Language Module): T-score Percentile      Naming   (29) 42 21  Verbal Fluency: T-score Percentile      Controlled Oral Word Association (F-A-S) 39 14      Semantic Fluency (Animals) 46 34      Trail Making Test: T-Score Percentile      Part A 54 66      Part B 56 73      Modified Wisconsin Card Sorting Test (MWCST): Standard/T-Score Percentile      Number of Categories Correct 32 4      Number of Perseverative Errors 37 9      Number of Total Errors 38 12      Percent Perseverative Errors 49 46  Executive Function Composite 74 4      Boston Diagnostic Aphasia Exam: Raw Score Scaled Score      Complex Ideational Material 11 9      Clock Drawing Raw Score Descriptor      Command 10 WNL      Rating Scales         Raw Score Descriptor  Patient Health Questionnaire - 9 14 Moderate  GAD-7 9 Mild      Quick Dementia Rating System Raw Score Descriptor      Sum of Boxes 2.5 Very Mild Dementia      Total Score 8 Mild Dementia   Bluma Buresh V. Nicole Kindred PsyD, [place] Clinical Neuropsychologist

## 2020-12-04 NOTE — Progress Notes (Signed)
[facility]  Patient Name: Jacob Foster MRN: [id number] Date of Birth: 1965/11/20 Age: 55 y.o. Education: 12 years  Referral Circumstances and Background Information  Mr. Nidal Rivet is a 55 y.o., right-hand dominant, married man with a history of idiopathic Parkinson's disease diagnosed by Dr. Carles Collet with our [facility] in March, 2022. He has since gotten a second opinion from Dr. Shela Leff with [facility] who also felt the presentation was consistent with Parkinson's disease. He complained of memory problems at his last appointment with Dr. Carles Collet and was referred for evaluation.   On interview, the patient reported that he has a "brain freeze" when people ask him a question, as it takes a while for him to come up with an answer. He is here with his wife who appreciates the same issues and that he gets "confused" when people ask him things. They think this is over about the past year and that his issues have been stable more or less over that time period. He also notices that he is slower when reading. He acknowledged difficulties with attention and concentration but couldn't provide any examples. He does have some memory problems and will ask the same question from time to time, although it sounds more like absentmindedness than it does a true amnestic memory problem. He said that others have noticed his difficulties at work, they will tell him to speed up, although he is not in danger of losing his job and there have been no disciplinary actions against him. With respect to mood, the patient has a history that is fairly convincing for major depressive episodes and he has been having a hard time as of late. He had a very hard time with his Parkinson's diagnosis and was in a "deep depression," presenting to the ED back in April, 2022. He reported that he is doing better since then. He has been going to the gym and doing more exercise. It sounds as though he  does still have some depressive symptoms but he is able to overcome them with activity. He has been out of work since early April as he adjusts to his medication and the like. He is not sleeping well since the diagnosis and was up for 3 days immediately following his diagnosis. Things are better than that now, but he is still getting up frequently and does not get good quality sleep. His weight is stable and appetite is adequate. He stated that he has been compliant with the medication and thinks it is helpful.   With respect to functioning, the patient was working until the time of his diagnosis. He is currently on leave as he adjusts to his diagnosis and medication but plans to go back to work. He works as a Management consultant at [employer] He stated that he is slower than in the past, that he will have to think about things longer than in the past, but he is still performing adequately. He hasn't told anybody about his Parkinson's diagnosis and does think he can go back to work. He stated that his wife will help him explain things to others, because it takes too long for him to do so himself, but otherwise there is nothing that he cannot do now that he used to do. His wife typically manages finances and organizes his appointments and does most of the paperwork. She thinks he would be able to do it if he had to. The patient's wife oversees his medications, although  he is able to to manage them for the most part independently. He is able to cook some basic things. He notices no changes in his ability to do things around the house, like mow the lawn. He has no issues using a computer or smart phone. He was previously involved in fixing up their house, although he has not been doing that because he is bothered by his physical limitations and it sounds like he also doesn't have the motivation related to his depression. He is still driving and denied getting lost or having any accidents.   Past Medical History  and Review of Relevant Studies   Patient Active Problem List   Diagnosis Date Noted  . Adjustment disorder with depressed mood 10/25/2020  . Parkinson's disease ([place]) 09/20/2020  . Essential hypertension 12/16/2019  . Lateral epicondylitis of right elbow 10/31/2019  . COVID-19 virus infection 03/15/2019  . Family history of colon cancer 12/08/2018  . Serrated polyp of colon 12/08/2018  . Gastroesophageal reflux disease without esophagitis 08/20/2018  . GAD (generalized anxiety disorder) 04/23/2018  . Seasonal affective disorder ([place]) 04/23/2018  . Overweight (BMI 25.0-29.9) 09/05/2017    Review of Neuroimaging and Relevant Medical History: The patient has an MRI brain (10/10/2019), which was reviewed and was unremarkable.   The patient denied any history of significant head injuries, neurological surgery, strokes, or seizures.   Current Outpatient Medications  Medication Sig Dispense Refill  . Azelastine-Fluticasone 137-50 MCG/ACT SUSP Place 1 spray into the nose every 12 (twelve) hours. 23 g 2  . carbidopa-levodopa (SINEMET IR) 25-100 MG tablet Take 1 tablet by mouth 3 (three) times daily. 5am/10am/3pm (Patient taking differently: Take 1 tablet by mouth 3 (three) times daily. 0700, 1200, 1700) 270 tablet 1  . carvedilol (COREG) 6.25 MG tablet Take 1 tablet (6.25 mg total) by mouth 2 (two) times daily. 180 tablet 0  . cetirizine (ZYRTEC) 10 MG tablet Take 1 tablet (10 mg total) by mouth daily. (Patient not taking: Reported on 10/28/2020) 30 tablet 11  . hydrochlorothiazide (HYDRODIURIL) 25 MG tablet TAKE 1 TABLET(25 MG) BY MOUTH DAILY (Patient not taking: Reported on 10/28/2020) 30 tablet 0  . hydrOXYzine (ATARAX/VISTARIL) 25 MG tablet Take 1 tablet (25 mg total) by mouth at bedtime as needed. 7 tablet 0  . naproxen sodium (ALEVE) 220 MG tablet Take 440 mg by mouth daily as needed (pain).    Marland Kitchen tiZANidine (ZANAFLEX) 4 MG tablet Take 1 tablet (4 mg total) by mouth every 6 (six) hours as  needed for muscle spasms. (Patient not taking: Reported on 10/28/2020) 30 tablet 1  . traMADol (ULTRAM) 50 MG tablet Take 1 tablet (50 mg total) by mouth every 8 (eight) hours as needed for severe pain. (Patient not taking: Reported on 10/28/2020) 5 tablet 0   No current facility-administered medications for this visit.    Family History  Problem Relation Age of Onset  . Hypertension Mother   . Hyperlipidemia Mother   . Breast cancer Mother   . Prostate cancer Father   . Kidney disease Father   . Colon cancer Father        in his 21's  . Throat cancer Brother        dies age 70 - alcoholic   . Hypertension Brother   . Esophageal cancer Brother   . Healthy Daughter   . Healthy Son   . Healthy Son   . Healthy Son   . Diabetes Brother   . Colon cancer Brother 34  .  Colon cancer Brother 23  . Colon cancer Maternal Uncle   . Colon cancer Maternal Uncle   . Colon polyps Neg Hx   . Stomach cancer Neg Hx   . Rectal cancer Neg Hx     There is a family history of dementia, his paternal aunt developed Alzheimer's (age unknown). His maternal uncle also had dementia, which developed in his mid 83s. There is a family history of psychiatric illness. His father has significant affective issues of some sort, "I don't know what he was diagnosed with." It sounds like he was delusional at times. He has a brother who also has mental health issues, with depression.    Psychosocial History  Developmental, Educational and Employment History: The patient is a native of [place]. He has lived in [place] since 2015. The patient reported that he was an adequate student who always did well when he applied himself. He denied being held back or having any learning problems. 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 He has worked at [employer] since 2014. 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 He  reported that he likes his job.   Psychiatric History: The patient reported that he has a history of depressive episodes. He first became symptomatic back in 1996, when he ran into financial problems while building their house. He was in the hospitalized related to thoughts of self-harm and was in the hospital for two weeks. He was placed on medication, which helped, although he stopped taking it. He denied any other hospitalizations and denied any suicide attempts, although there have been times when he "goes in a dark place" and will think about harming himself. The patient was having thoughts of harming himself previously, after his diagnosis. The patient denied any thoughts of harming himself at present, he said that they come to him occasionally if he is not busy, although he has no plan or intent. His wife keeps a close eye on him and he identified his family as a protective factor.   Substance Use History: The patient doesn't drink, he doesn't use any illicit substances and he has never used to tobacco.   Relationship History and Living Cimcumstances: The patient and his wife have been married for 34 years. They have 4 children, ages 63 to 29. Three of their children live with them.   Mental Status and Behavioral Observations  Sensorium/Arousal: The patient's level of arousal was awake and alert. Hearing and vision were adequate for testing purposes. Orientation: The patient was alert and oriented to person, place, time, and situation.  Appearance: The patient was dressed in appropriate, casual clothing Behavior: Pleasant, appropriate Speech/language: The patient spoke with normal rate, rhythm, volume, and prosody.  Gait/Posture: Not formally examined Movement: Patient demonstrated a LUE rest tremor, hypomimia, may have had LLE tremor but some of that seemed behavioral. Some hypokinesia Social Comportment: Pleasant and appropriate Mood: Patient reported he is ok if he stays busy Affect:  Hypomimia Thought process/content: The patient's thought process was logical and goal directed although he did seem to have some bradyphrenia and took a long time to explain things. His thoughts were not frankly disorganized and he was able to respond directly to questions when given adequate time. Thought content was appropriate.  Safety: The patient admitted to having thoughts of killing himself previously, he has been doing better with these lately,  but they will happen at times if he is not busy. He stated that these thoughts are not accompanied by any intent or plan and he has no desire of actually ending his life. I also queried his response to item 9 on the PHQ, which was a 2. He once again reaffirmed that he has no intent or plan.  [facility]: [facility] Cognitive Assessment  12/04/2020  Visuospatial/ Executive (0/5) 2  Naming (0/3) 3  Attention: Read list of digits (0/2) 2  Attention: Read list of letters (0/1) 1  Attention: Serial 7 subtraction starting at 100 (0/3) 3  Language: Repeat phrase (0/2) 1  Language : Fluency (0/1) 0  Abstraction (0/2) 2  Delayed Recall (0/5) 3  Orientation (0/6) 6  Total 23  Adjusted Score (based on education) 24   Test Procedures  Wide Range Achievement Test - 4   Word Reading Wechsler Adult Intelligence Scale - IV  Digit Span  Arithmetic  Symbol Search  Coding Repeatable Battery for the Assessment of Neuropsychological Status (Form A) ACS Word Choice The Dot Counting Test Controlled Oral Word Association (F-A-S) Semantic Fluency (Animals) Trail Making Test A & B Modified Wisconsin Card Sorting Test Patient Health Questionnaire - 9  GAD-7  Plan  Jeriko Kowalke was seen for a psychiatric diagnostic evaluation and neuropsychological testing. He is a pleasant, 55 year old, right-hand dominant man with a history of newly diagnosed Parkinson's disease. He has a history of major depressive episodes but minimal treatment and it sounds as though  affectively, he has been having a hard time since his diagnosis. Cognitively his main complaint is bradyphrenia, taking longer to do things at work and "confusion" when people ask him things. Full and complete note with impressions, recommendations, and interpretation of test data to follow.   Viviano Simas Nicole Kindred, PsyD, [place] Clinical Neuropsychologist  Informed Consent  Risks and benefits of the evaluation were discussed with the patient prior to all testing procedures. I conducted a clinical interview and neuropsychological testing (at least two tests) with [name] and Lamar Benes, B.S. (Technician) administered additional test procedures. The patient was able to tolerate the testing procedures and the patient (and/or family if applicable) is likely to benefit from further follow up to receive the diagnosis and treatment recommendations, which will be rendered at the next encounter.

## 2020-12-05 ENCOUNTER — Telehealth: Payer: Self-pay | Admitting: Neurology

## 2020-12-05 NOTE — Progress Notes (Signed)
[facility]  Patient Name: Ghazi Rumpf MRN: [id number] Date of Birth: 1965/09/07 Age: 55 y.o. Education: 12 years  Clinical Impressions  Ender Rorke is a 55 y.o., right-hand dominant, married man with a history of HTN, depressive episodes, and idiopathic Parkinson's disease that was diagnosed just recently by Dr. Carles Collet with our [facility]. Unfortunately, he has been having significant difficulties with mood since the diagnosis. He is also complaining of cognitive difficulties, over the past year. His coworkers have noticed that he is not as fast as he once was although he is still performing adequately and he does think he can go back to work. He has an MRI of the brain that was unremarkable.   On neuropsychological assessment, there is suggestion of less than expected performance on select measures of executive abilities and on indicators of processing speed. He also had very low scores on measures of immediate and delayed recall of verbal material with essentially no benefit from recognition cuing. While these scores may reflect executive interference, the possibility of a developing storage problem cannot be entirely excluded given the low level of performance and lack of improvement to cuing. I suspect that premorbid weaknesses in verbal abilities are also a major contributing factor and he did do better with delayed recall of visual information. Performance on measures of attention and working memory, primary language abilities, and visuospatial/constructional abilities were essentially within normal limits for him. He screened in the moderate range for depressive symptoms and the mild range for anxiety symptoms.   Presentation viewed as most consistent with a mild cognitive impairment level problem, which is mostly likely secondary to this patient's Parkinson's disease. As the reader is no doubt aware, 20% of more of individual with Parkinson's  have measurable cognitive deficits at diagnosis and a much greater proportion go on to develop such difficulties as the disease progresses. There are also likely contributions from poor sleep and affective issues at this point, but those are unlikely the only causes of his problems. Mr. Orndoff may benefit from consultation with psychiatry and also psychotherapy, because he is clearly depressed. He is already working with Althea Charon, LCSW on community resources. He started exercising recently, which will be heavily reinforced. He may return for updated evaluation as clinically indicatred   Diagnostic Impressions: Mild neurocognitive disorder due to Parkinson's disease Depressive disorder  Recommendations to be discussed with patient  Your performance and presentation on assessment were consistent with preexisting strengths and weaknesses. You did very well on indicators of visual abilities and then had relatively more difficulty with verbal abilities, which probably reflects a combination of your learning history and the way your mind works. Beyond that I do think there is some decline in aspects of executive functioning and with processing speed. These are highly characteristic of Parkinson's disease. You also had low scores on verbal memory measures, my sense is that these may represent the interaction of your processing speed and executive difficulties with your preexisting strengths and weaknesses, although the possibility of a more primary memory problem cannot be entirely ruled out. In any event, I think that mild cognitive impairment is the best term for your level of problem.   The major difference between mild cognitive impairment (MCI) and dementia is in severity and potential prognosis. Once someone reaches a level of severity adequate to be diagnosed with a dementia, there is usually progression over time, though this may be years. On the other hand, mild cognitive impairment, while a  significant risk for dementia in future, does not always progress to dementia, and in some instances stays the same or can even revert to normal. It is important to realize that if MCI is due to underlying Alzheimer's disease, it will most likely progress to dementia eventually. The rate of conversion to Alzheimer's dementia from amnestic MCI is about 15% per year versus the general population risk of conversion of 2% per year.   In your case, your mild cognitive impairment is almost certainly due to your Parkinson's disease. Parkinson's is known for causing tremors and slowness of movement but it also results in similar symptoms cognitively. Often, people have "bradyphrenia" which is slowing of thought and that may be why it is taking you longer to respond to people in conversations and the like. People will also have difficulties with control of attention, reasoning, and other traditionally "executive" abilities. Executive abilities help [place] coordinate behavior fragments into successful behavior programs.   If your mild cognitive impairment is due to Parkinson's, then it will probably progress over time, although I would like to reassure you that the rate at which this happens is extremely slow. Individuals often have Parkinson's for several decades, so it is premature to think about late stage disease. Individuals with Parkinson's disease do not typically reach a dementia level problem until more than a decade into the disease, and it is often later than that. You should also take comfort in the fact that there are multiple different pharmacologic and surgical treatment options for Parkinson's disease, many of which are highly effective.   First, I would like to heavily reinforce your decision to start exercising. Exercising is an excellent health intervention for high blood pressure, Parkinson's disease, and cognitive impairment. Exercise is also extremely helpful for mental health and so I suggest you  stick at it. Most people need about 30 minutes of moderate intensity exercise per day.   Second, it is important that you follow with Dr. Carles Collet for treatment of your Parkinson's disease. Medications are often very helpful for the condition and she is in the best position to advise you about therapeutic options, so keep your appointments and adhere diligently to the prescribed treatment.   You previously had an appointment scheduled with psychiatry but then cancelled it because you felt you were doing better. You are still screening in the moderate depression range on your mood measure. It also sounds as though you are intermittently having thoughts of self harm. While things can always be worse, this is not consistent with a picture of good psychological health, and therefore I suggest you get help. If you do not wish to take medication for mood then you may consider psychotherapy. Depression can affect cognitive functioning in several ways. For one, there are neurobiological changes in depression that can contribute to attention, concentration, and memory problems. These changes are so common they are part of the criteria we use to diagnose depressive disorders. Depression can also negatively impact your own appraisal of your cognitive abilities, leading you to feel like you are performing more poorly than is objectively warranted.  In terms of managing your day-to-day cognitive symptoms, you may wish to try the following compensatory strategies:  Marland Kitchen Minimize distractions and interruptions to the extent possible. Be an active observer, present-minded, and focus attention. Focus on only one task for a period of time.  . Get organized. Establish routines and stick to them. Make and use checklists.  . Use external memory aids as needed, such as  a planner and notebook. Repetition, written reminders, and keeping a calendar of appointments may be helpful. Marlene Lard a place to keep your keys, wallet, cell phone,  and other personal belongings.  . Break down tasks into smaller steps to help get started and to keep from feeling overwhelmed.  . Increase your success learning information by breaking it into manageable chunks, connecting it to previously learned information, or forming associations with what you are trying to remember.   Test Findings  Test scores are summarized in additional documentation associated with this encounter. Test scores are relative to age, gender, and educational history as available and appropriate. There were no concerns about performance validity as all findings fell within normal expectations.   General Intellectual Functioning/Achievement:  Performance on single word reading fell at the lower end of the low average range, which could be due to academic quality/quantity or preexisting strengths and weaknesses. He performed at an average level on the RIST index, with performance at the lower extreme of the low average range on the verbal subtest and performance towards the upper portion of the average range on the more visually oriented subtest. Findings suggest somewhat better developed visual as compared to verbal abilities.  Attention and Processing Efficiency: Performance on indicators of attention and working memory were variable, with an overall impression of adequate capacities and low average performance on the Working Memory Index of the WAIS-IV. Digit repetition forward was average on one measure and unusually low on another. Digit repetition backward and digit resequencing in ascending order were low average. Mental solving of arithmetical word problems without paper and pencil was average.   With respect to processing speed, performance was lower than expected with an unusually low performance level on the Processing Speed Index of the WAIS-IV. Timed number symbol coding was unusually low. Performance on a symbol matching to sample task involving efficient visual scanning  and efficient visual matching was low average. Simple numeric sequencing was average.   Language: Performance on the fundamental ability of visual object confrontation naming was in the normal range with a low average score. He performed at a low average level when generating words in response to the letters F-A-S whereas generation of words in response to the category prompt "animals" was average.   Visuospatial Function: Performance on visuospatial and constructional indicators was generally good, representing an area of strength for this patient. His overall performance was average with comparable performance around the average range on measures of perception and construction.   Learning and Memory: Performance on learning and memory measures was low with notable encoding difficulties and very significant difficulties with delayed recall of verbal information. My sense is that this reflects a combination of the patient's preexisting strengths and weaknesses and other interfering impairments, although the possibility of a developing primary memory problem canot be entirely excluded because these are very low scores.   In the verbal realm, Mr. Pafford demonstrated unusually low learning for a 10-item word list. His long delayed recall was extremely low, with no words recalled, and his performance did not improve with recognition cuing. His immediate recall for a short story was extremely low and he did not retain any information on long delayed recall, in fact making up a different story with almost a confabulatory quality.   In the visual realm, delayed recall for a modestly complex geometric figure was much better and fell at an average level.   Executive Functions: Performance was mixed on executive indicators with an impression of  some mild problems. He performed at a weak low average level when generating words on the basis of letter cues. He demonstrated unusually low performance on the  Executive Function Composite of the [employer]. He did better when alternating sequencing numbers and letters of the alphabet and when reasoning with verbal information on the complex ideational material, both of which were average.   Rating Scale(s): Mr. Bohnet reported moderate levels of depressive symptoms. He screened in the mild range for anxiety symptoms. His wife characterized him as functioning nearly at a dementia level, although much of that is due to the extent of his affective changes. He is at no more than an MCI level problem cognitively, although he may be functioning more poorly given his affective and movement issues.   Viviano Simas Nicole Kindred, PsyD, ABN Clinical Neuropsychologist  Coding and Compliance  Billing below reflects technician time, my direct face-to-face time with the patient, time spent in test administration, and time spent in professional activities including but not limited to: neuropsychological test interpretation, integration of neuropsychological test data with clinical history, report preparation, treatment planning, care coordination, and review of diagnostically pertinent medical history or studies.   Services associated with this encounter: Clinical Interview [phone]) plus 150 minutes (96132/96133; Neuropsychological Evaluation by Professional)  115 minutes (96138/96139; Neuropsychological Testing by Technician)

## 2020-12-05 NOTE — Telephone Encounter (Signed)
Patient has sent a MyChart message as well. I will sent Dr. Doristine Devoid reply on Mychart.

## 2020-12-05 NOTE — Telephone Encounter (Signed)
Pt has been to [facility] neurology and has an appt with [facility] neurology as well on 12/1.  He cancelled appts here.  This is establishing with other neurologists.  Because of our wait and access issues, we will let him follow up where he is being seen.  Wish him the best of luck!

## 2020-12-06 NOTE — Telephone Encounter (Signed)
Called patient to let him know that his paperwork has been faxed to the office.

## 2020-12-11 ENCOUNTER — Telehealth: Payer: Self-pay | Admitting: Registered Nurse

## 2020-12-11 NOTE — Telephone Encounter (Signed)
Patients wife is calling in reference to patient's FMLA disability paperwork - please call

## 2020-12-12 NOTE — Telephone Encounter (Signed)
I called and spoke with patients wife.

## 2020-12-13 ENCOUNTER — Encounter: Payer: Commercial Managed Care - PPO | Admitting: Counselor

## 2020-12-14 ENCOUNTER — Telehealth: Payer: Self-pay | Admitting: Registered Nurse

## 2020-12-14 NOTE — Telephone Encounter (Signed)
..  Type of form received: Disability   Additional comments:   Received by:  Tye Savoy should be Faxed to:  [phone] or [phone]  Form should be mailed to:    Is patient requesting call for pickup:   Form placed:  In Richard's bin up front  Attach charge sheet.  Provider will determine charge.  Individual made aware of 3-5 business day turn around (Y/N)?

## 2020-12-17 ENCOUNTER — Other Ambulatory Visit: Payer: Self-pay

## 2020-12-17 DIAGNOSIS — R39198 Other difficulties with micturition: Secondary | ICD-10-CM

## 2020-12-17 MED ORDER — TAMSULOSIN HCL 0.4 MG PO CAPS: 0.4000 mg | ORAL_CAPSULE | Freq: Every day | ORAL | 0 refills | Status: DC

## 2020-12-17 NOTE — Telephone Encounter (Signed)
I have received this paperwork from up front patient wife stated that it must have been an duplicate of the form that was already completed.

## 2020-12-20 ENCOUNTER — Telehealth: Payer: Self-pay | Admitting: Registered Nurse

## 2020-12-20 NOTE — Telephone Encounter (Signed)
Patient would like a call back

## 2020-12-20 NOTE — Telephone Encounter (Signed)
I called an spoke with the Patient wife to figure out what all was needed for the paperwork to be complete

## 2020-12-24 NOTE — Telephone Encounter (Signed)
Patient called back about form

## 2020-12-25 ENCOUNTER — Encounter: Payer: Self-pay | Admitting: Registered Nurse

## 2020-12-25 ENCOUNTER — Telehealth: Payer: Self-pay | Admitting: Registered Nurse

## 2020-12-25 ENCOUNTER — Ambulatory Visit: Payer: Commercial Managed Care - PPO | Admitting: Registered Nurse

## 2020-12-25 ENCOUNTER — Other Ambulatory Visit: Payer: Self-pay

## 2020-12-25 VITALS — BP 112/74 | HR 77 | Temp 98.3°F | Resp 18 | Ht 68.0 in | Wt 177.2 lb

## 2020-12-25 DIAGNOSIS — F4323 Adjustment disorder with mixed anxiety and depressed mood: Secondary | ICD-10-CM

## 2020-12-25 DIAGNOSIS — R079 Chest pain, unspecified: Secondary | ICD-10-CM

## 2020-12-25 LAB — COMPREHENSIVE METABOLIC PANEL
ALT: 9 U/L (ref 0–53)
AST: 18 U/L (ref 0–37)
Albumin: 4.5 g/dL (ref 3.5–5.2)
Alkaline Phosphatase: 71 U/L (ref 39–117)
BUN: 15 mg/dL (ref 6–23)
CO2: 32 mEq/L (ref 19–32)
Calcium: 9.5 mg/dL (ref 8.4–10.5)
Chloride: 98 mEq/L (ref 96–112)
Creatinine, Ser: 1.05 mg/dL (ref 0.40–1.50)
GFR: 81.47 mL/min (ref >60.00)
Glucose, Bld: 73 mg/dL (ref 70–99)
Potassium: 4 mEq/L (ref 3.5–5.1)
Sodium: 138 mEq/L (ref 135–145)
Total Bilirubin: 1 mg/dL (ref 0.2–1.2)
Total Protein: 7.4 g/dL (ref 6.0–8.3)

## 2020-12-25 LAB — LIPID PANEL
Cholesterol: 203 mg/dL — ABNORMAL HIGH (ref 0–200)
HDL: 34.3 mg/dL — ABNORMAL LOW (ref >39.00)
LDL Cholesterol: 140 mg/dL — ABNORMAL HIGH (ref 0–99)
NonHDL: 168.63
Total CHOL/HDL Ratio: 6
Triglycerides: 141 mg/dL (ref 0.0–149.0)
VLDL: 28.2 mg/dL (ref 0.0–40.0)

## 2020-12-25 LAB — CBC
HCT: 51.7 % (ref 39.0–52.0)
Hemoglobin: 17.4 g/dL — ABNORMAL HIGH (ref 13.0–17.0)
MCHC: 33.7 g/dL (ref 30.0–36.0)
MCV: 89 fl (ref 78.0–100.0)
Platelets: 203 10*3/uL (ref 150.0–400.0)
RBC: 5.81 Mil/uL (ref 4.22–5.81)
RDW: 15.1 % (ref 11.5–15.5)
WBC: 5 10*3/uL (ref 4.0–10.5)

## 2020-12-25 LAB — TSH: TSH: 0.72 u[IU]/mL (ref 0.35–4.50)

## 2020-12-25 LAB — BRAIN NATRIURETIC PEPTIDE: Pro B Natriuretic peptide (BNP): 12 pg/mL (ref 0.0–100.0)

## 2020-12-25 MED ORDER — BUPROPION HCL ER (SR) 100 MG PO TB12: 100.0000 mg | ORAL_TABLET | Freq: Two times a day (BID) | ORAL | 1 refills | Status: DC

## 2020-12-25 NOTE — Telephone Encounter (Signed)
Patient's wife called and said that she had spoken with [employer] about her husbands diability form and that they do not have everything they need.  Please advise.

## 2020-12-25 NOTE — Patient Instructions (Addendum)
Mr. And Mrs. Asbridge -   Great to see you both. In brief:  Start Wellbutrin 100mg  SR by mouth once daily. After a few days can increase to twice daily. Should help with mood. Ok to continue on blood pressure meds. Let me know if pressure drops too low or creeps up too high. I will refer to cardiology for work up for the exertional chest pain. In addition, we will draw some labs today to ensure things are stable. I have placed a referral to counseling  Continue exercise Let me know if anything gets worse or doesn't get better See you in 3 mo, sooner if anything comes up  Thank you,  Rich     If you have lab work done today you will be contacted with your lab results within the next 2 weeks.  If you have not heard from [place] then please contact us. The fastest way to get your results is to register for My Chart.   IF you received an x-ray today, you will receive an invoice from [facility]. Please contact [facility] at [phone] with questions or concerns regarding your invoice.   IF you received labwork today, you will receive an invoice from [facility]. Please contact [facility] at [phone] with questions or concerns regarding your invoice.   Our billing staff will not be able to assist you with questions regarding bills from these companies.  You will be contacted with the lab results as soon as they are available. The fastest way to get your results is to activate your My Chart account. Instructions are located on the last page of this paperwork. If you have not heard from [place] regarding the results in 2 weeks, please contact this office.

## 2020-12-25 NOTE — Progress Notes (Signed)
Established Patient Office Visit  Subjective:  Patient ID: Jacob Foster, male    DOB: 1966/03/10  Age: 55 y.o. MRN: [id number]  CC:  Chief Complaint  Patient presents with   Follow-up    Patient states he is here for a 2 month follow up.     HPI Jacob Foster presents for 2 mo follow up  Stable Has been seen by Dr. Carles Collet, Dr. Shela Leff at [facility], and Dr. Nicole Kindred in [facility] Has been formally diagnosed with a neurocognitive disorder r/t Parkinson's  Has been exercising consistently. Notes good effect from this on physical and mental health. Would like to consider therapy/counseling and a referral to psychiatry.  He was seen briefly in the ER one week ago for exertional chest pain. Negative troponins, ekg wnl. Was released with suggestion for PCP follow up to coordinate referral to cardiologist. No ongoing pain. Notes hx of hld. Chest pain not reproducible at this time. Does note that he has restarted his carvedilol and hctz. Tolerating well, bp wnl.  Otherwise no acute concerns. Not ready to return to work - still working on establishing new baseline, stability in condition,and outlook.  Past Medical History:  Diagnosis Date   Abscess of axilla    Arthritis    Depression    seasonal affective D/O    Family history of colon cancer 12/08/2018   Father and brother. Sees Dr. Enis Gash   GERD (gastroesophageal reflux disease)    Hidradenitis suppurativa    History of chickenpox    HTN (hypertension)    Hyperlipemia    Medical history non-contributory    Neck pain    Serrated polyp of colon 12/08/2018   Sleep apnea     Past Surgical History:  Procedure Laterality Date   ABSCESS DRAINAGE     ELBOW SURGERY      Family History  Problem Relation Age of Onset   Hypertension Mother    Hyperlipidemia Mother    Breast cancer Mother    Prostate cancer Father    Kidney disease Father    Colon cancer Father        in his 19's   Throat cancer Brother        dies age 90 -  alcoholic    Hypertension Brother    Esophageal cancer Brother    Healthy Daughter    Healthy Son    Healthy Son    Healthy Son    Diabetes Brother    Colon cancer Brother 49   Colon cancer Brother 57   Colon cancer Maternal Uncle    Colon cancer Maternal Uncle    Colon polyps Neg Hx    Stomach cancer Neg Hx    Rectal cancer Neg Hx     Social History   Socioeconomic History   Marital status: Married    Spouse name: Secretary/administrator   Number of children: 4   Years of education: Not on file   Highest education level: Not on file  Occupational History   Occupation: Magazine features editor: [employer]  Tobacco Use   Smoking status: Never   Smokeless tobacco: Never  Vaping Use   Vaping Use: Never used  Substance and Sexual Activity   Alcohol use: No   Drug use: No   Sexual activity: Yes  Other Topics Concern   Not on file  Social History Narrative   Originally from [place] Determinants of Health   Financial Resource Strain: Not on file  Food Insecurity: Not on file  Transportation Needs: Not on file  Physical Activity: Not on file  Stress: Not on file  Social Connections: Not on file  Intimate Partner Violence: Not on file    Outpatient Medications Prior to Visit  Medication Sig Dispense Refill   Azelastine-Fluticasone 137-50 MCG/ACT SUSP Place 1 spray into the nose every 12 (twelve) hours. 23 g 2   carbidopa-levodopa (SINEMET IR) 25-100 MG tablet Take 1 tablet by mouth 3 (three) times daily. 5am/10am/3pm (Patient taking differently: Take 1 tablet by mouth 3 (three) times daily. 0700, 1200, 1700) 270 tablet 1   carvedilol (COREG) 6.25 MG tablet Take 1 tablet (6.25 mg total) by mouth 2 (two) times daily. 180 tablet 0   cetirizine (ZYRTEC) 10 MG tablet Take 1 tablet (10 mg total) by mouth daily. (Patient not taking: Reported on 10/28/2020) 30 tablet 11   hydrochlorothiazide (HYDRODIURIL) 25 MG tablet TAKE 1 TABLET(25 MG) BY MOUTH DAILY (Patient not taking:  Reported on 10/28/2020) 30 tablet 0   hydrOXYzine (ATARAX/VISTARIL) 25 MG tablet Take 1 tablet (25 mg total) by mouth at bedtime as needed. 7 tablet 0   naproxen sodium (ALEVE) 220 MG tablet Take 440 mg by mouth daily as needed (pain).     tamsulosin (FLOMAX) 0.4 MG CAPS capsule Take 1 capsule (0.4 mg total) by mouth daily after supper. 30 capsule 0   tiZANidine (ZANAFLEX) 4 MG tablet Take 1 tablet (4 mg total) by mouth every 6 (six) hours as needed for muscle spasms. (Patient not taking: Reported on 10/28/2020) 30 tablet 1   traMADol (ULTRAM) 50 MG tablet Take 1 tablet (50 mg total) by mouth every 8 (eight) hours as needed for severe pain. (Patient not taking: Reported on 10/28/2020) 5 tablet 0   No facility-administered medications prior to visit.    No Known Allergies  ROS Review of Systems  Constitutional: Negative.   HENT: Negative.    Eyes: Negative.   Respiratory: Negative.    Cardiovascular: Negative.   Gastrointestinal: Negative.   Genitourinary: Negative.   Musculoskeletal: Negative.   Skin: Negative.   Neurological: Negative.   Psychiatric/Behavioral: Negative.    All other systems reviewed and are negative.    Objective:    Physical Exam Constitutional:      General: He is not in acute distress.    Appearance: Normal appearance. He is normal weight. He is not ill-appearing, toxic-appearing or diaphoretic.  Cardiovascular:     Rate and Rhythm: Normal rate and regular rhythm.     Heart sounds: Normal heart sounds. No murmur heard.   No friction rub. No gallop.  Pulmonary:     Effort: Pulmonary effort is normal. No respiratory distress.     Breath sounds: Normal breath sounds. No stridor. No wheezing, rhonchi or rales.  Chest:     Chest wall: No tenderness.  Neurological:     General: No focal deficit present.     Mental Status: He is alert and oriented to person, place, and time. Mental status is at baseline.  Psychiatric:        Mood and Affect: Mood normal.         Behavior: Behavior normal.        Thought Content: Thought content normal.        Judgment: Judgment normal.    BP 112/74   Pulse 77   Temp 98.3 F (36.8 C) (Temporal)   Resp 18   Ht 5\' 8"  (1.727 m)   Wt 177  lb 3.2 oz (80.4 kg)   SpO2 99%   BMI 26.94 kg/m  Wt Readings from Last 3 Encounters:  12/25/20 177 lb 3.2 oz (80.4 kg)  10/24/20 177 lb 9.6 oz (80.6 kg)  10/11/20 182 lb (82.6 kg)     There are no preventive care reminders to display for this patient.  There are no preventive care reminders to display for this patient.  Lab Results  Component Value Date   TSH 1.28 09/20/2020   Lab Results  Component Value Date   WBC 5.7 10/28/2020   HGB 17.2 (H) 10/28/2020   HCT 51.7 10/28/2020   MCV 87.6 10/28/2020   PLT 222 10/28/2020   Lab Results  Component Value Date   NA 136 10/28/2020   K 3.9 10/28/2020   CO2 23 10/28/2020   GLUCOSE 92 10/28/2020   BUN 12 10/28/2020   CREATININE 0.92 10/28/2020   BILITOT 1.0 10/28/2020   ALKPHOS 60 10/28/2020   AST 25 10/28/2020   ALT 31 10/28/2020   PROT 7.6 10/28/2020   ALBUMIN 4.2 10/28/2020   CALCIUM 9.0 10/28/2020   ANIONGAP 8 10/28/2020   GFR 77.19 09/20/2020   Lab Results  Component Value Date   CHOL 231 (H) 12/08/2018   Lab Results  Component Value Date   HDL 36.20 (L) 12/08/2018   Lab Results  Component Value Date   LDLCALC 156 (H) 12/08/2018   Lab Results  Component Value Date   TRIG 197.0 (H) 12/08/2018   Lab Results  Component Value Date   CHOLHDL 6 12/08/2018   No results found for: HGBA1C    Assessment & Plan:   Problem List Items Addressed This Visit   None   No orders of the defined types were placed in this encounter.   Follow-up: No follow-ups on file.   PLAN Repeat labs. Add bnp. Will refer to cardiology. Not sure this was cardiac in etiology given reassuring ER work up but given his hx of elevated lipids will check these today and may benefit from stress test, echo,  angiogram, etc - will defer to cardiology's decisionmaking  Refer to counseling. Agree he would benefit from this. Will start on wellbutrin 100mg  SR po qd. If tolerated well can increase to bid after a few days. Return in 3 mo for med check. Discussed r/b/se of this medication Discussed that he is not fit to return to work - may need to reconsider what he does for work pending his physical abilities. I do think he will see benefit from continuing exercise and compliance with medication Patient encouraged to call clinic with any questions, comments, or concerns.   Maximiano Coss, NP

## 2020-12-26 NOTE — Telephone Encounter (Signed)
Pt's wife called in asking to speak to [place] about the FMLA/Disability forms she states they are past due and is needing an update on this asap. Please advise

## 2020-12-26 NOTE — Telephone Encounter (Signed)
Patient came to the office and I spoke with him and his wife letting them know that the letter that they needed had to come specifically from [place] and he started on it soon as he finally took a lunch. I also Faxed the portion that I could to [place] around 4:40 today it was a total of 56 pages.  Patient understood

## 2020-12-27 ENCOUNTER — Telehealth: Payer: Self-pay

## 2020-12-27 NOTE — Telephone Encounter (Signed)
Called patient to see if he received any updates from [facility] because we emailed and faxed all paperwork that was needed. Patient is going to call now .

## 2021-01-02 ENCOUNTER — Telehealth: Payer: Self-pay | Admitting: Counselor

## 2021-01-02 ENCOUNTER — Other Ambulatory Visit: Payer: Self-pay | Admitting: Registered Nurse

## 2021-01-02 DIAGNOSIS — R39198 Other difficulties with micturition: Secondary | ICD-10-CM

## 2021-01-02 NOTE — Telephone Encounter (Signed)
Patient was dismissed from our office and needed to get in to get results of the testing with Nicole Kindred. I have called him several times to get this patient in to our office to get results. The last times that I called was on 12-24-20 and offered him appt on 12-26-20 and he told me he could not do that and would have to call me back and then I called on 12-31-20 to try and get him schedule and had to [facility]. I asked him to call our office back to get schedule.

## 2021-01-08 NOTE — Progress Notes (Signed)
Asked to check patient's blood pressure after exercising at the [facility], reports feeling light headed. Discussed history, parkinson's,  HTN, recent cardiac event at [facility]. First BP 152/100 Left arm, radial pulse irregular, (??Afib), pt denies any chest pain/fluttering; after 5-10 minutes, BP 150/90 right arm, radial pulse strong and regular; reports he has upcoming cardiac referral with cardiologist. Denies any further dizziness/light headedness and continues to deny chest pain. Subsequent radial pulse checks=HR strong and regular. Advised to contact primary MD with these results, avoid strenuous workouts until seen by cardiology, call MD for any chest pain, fluttering, dizziness.

## 2021-02-11 ENCOUNTER — Other Ambulatory Visit: Payer: Self-pay

## 2021-02-11 DIAGNOSIS — I10 Essential (primary) hypertension: Secondary | ICD-10-CM

## 2021-02-11 MED ORDER — HYDROCHLOROTHIAZIDE 25 MG PO TABS: ORAL_TABLET | ORAL | 0 refills | Status: DC

## 2021-02-25 NOTE — Progress Notes (Signed)
Cardiology Office Note:    Date:  02/26/2021   ID:  Jacob Foster, DOB [birth year], MRN [id number]  PCP:  Maximiano Coss, NP  Cardiologist:  None  Electrophysiologist:  None   Referring MD: Maximiano Coss, NP   Chief Complaint  Patient presents with   New Patient (Initial Visit)                                                                                   Chest Pain    History of Present Illness:    Jacob Foster is a 55 y.o. male with a hx of Parkinson's, hypertension, hyperlipidemia, and depression is referred by Maximiano Coss, NP for evaluation of chest pain.  He was seen in the ED at [facility] for chest pain on 12/18/2020.  Troponins negative.  Reports he was walking out of doctor's office and started having chest pain.  Describes as sharp left-sided chest pain that occurred while he walked.  Lasted for 7 to 8 minutes.  He denies any chest pain since that time.  He walks for 30 to 45 minutes every morning.  Denies chest pain with this but does report gets short of breath, particular with walking up hills.  Has also been having issues with feeling like food gets stuck when he swallows.  Has significant coughing spells when he eats, which can lead to lightheadedness.  He denies any syncope.  Denies any lower extremity edema or palpitations.  No smoking history.  No known family history of heart disease.    Past Medical History:  Diagnosis Date   Abscess of axilla    Arthritis    Depression    seasonal affective D/O    Family history of colon cancer 12/08/2018   Father and brother. Sees Dr. Enis Gash   GERD (gastroesophageal reflux disease)    Hidradenitis suppurativa    History of chickenpox    HTN (hypertension)    Hyperlipemia    Medical history non-contributory    Neck pain    Serrated polyp of colon 12/08/2018   Sleep apnea     Past Surgical History:  Procedure Laterality Date   ABSCESS DRAINAGE     ELBOW SURGERY      Current Medications: Current Meds   Medication Sig   Azelastine-Fluticasone 137-50 MCG/ACT SUSP Place 1 spray into the nose every 12 (twelve) hours.   carbidopa-levodopa (SINEMET IR) 25-100 MG tablet Take 1 tablet by mouth 3 (three) times daily. 5am/10am/3pm (Patient taking differently: Take 1 tablet by mouth 3 (three) times daily. 0700, 1200, 1700)   carvedilol (COREG) 6.25 MG tablet Take 1 tablet (6.25 mg total) by mouth 2 (two) times daily.   hydrochlorothiazide (HYDRODIURIL) 25 MG tablet TAKE 1 TABLET(25 MG) BY MOUTH DAILY   naproxen sodium (ALEVE) 220 MG tablet Take 440 mg by mouth daily as needed (pain).   rosuvastatin (CRESTOR) 10 MG tablet Take 1 tablet (10 mg total) by mouth daily.   tiZANidine (ZANAFLEX) 4 MG tablet Take 1 tablet (4 mg total) by mouth every 6 (six) hours as needed for muscle spasms.   traMADol (ULTRAM) 50 MG tablet Take 1 tablet (50 mg total) by mouth every 8 (  eight) hours as needed for severe pain.     Allergies:   Patient has no known allergies.   Social History   Socioeconomic History   Marital status: Married    Spouse name: Karma   Number of children: 4   Years of education: Not on file   Highest education level: Not on file  Occupational History   Occupation: Magazine features editor: [employer]  Tobacco Use   Smoking status: Never   Smokeless tobacco: Never  Vaping Use   Vaping Use: Never used  Substance and Sexual Activity   Alcohol use: No   Drug use: No   Sexual activity: Yes  Other Topics Concern   Not on file  Social History Narrative   Originally from [place] Determinants of Health   Financial Resource Strain: Not on file  Food Insecurity: Not on file  Transportation Needs: Not on file  Physical Activity: Not on file  Stress: Not on file  Social Connections: Not on file     Family History: The patient's family history includes Breast cancer in his mother; Colon cancer in his father, maternal uncle, and maternal uncle; Colon cancer (age of onset: 25)  in his brother; Colon cancer (age of onset: 44) in his brother; Diabetes in his brother; Esophageal cancer in his brother; Healthy in his daughter, son, son, and son; Hyperlipidemia in his mother; Hypertension in his brother and mother; Kidney disease in his father; Prostate cancer in his father; Throat cancer in his brother. There is no history of Colon polyps, Stomach cancer, or Rectal cancer.  ROS:   Please see the history of present illness.     All other systems reviewed and are negative.  EKGs/Labs/Other Studies Reviewed:    The following studies were reviewed today:   EKG:  EKG is  ordered today.  The ekg ordered today demonstrates sinus rhythm, rate 59, nonspecific T wave flattening  Recent Labs: 12/25/2020: ALT 9; BUN 15; Creatinine, Ser 1.05; Hemoglobin 17.4; Platelets 203.0; Potassium 4.0; Pro B Natriuretic peptide (BNP) 12.0; Sodium 138; TSH 0.72  Recent Lipid Panel    Component Value Date/Time   CHOL 203 (H) 12/25/2020 1601   TRIG 141.0 12/25/2020 1601   HDL 34.30 (L) 12/25/2020 1601   CHOLHDL 6 12/25/2020 1601   VLDL 28.2 12/25/2020 1601   LDLCALC 140 (H) 12/25/2020 1601   LDLCALC 156 (H) 11/27/2017 1630    Physical Exam:    VS:  BP 128/78 (BP Location: Left Arm, Patient Position: Sitting, Cuff Size: Normal)   Pulse (!) 59   Ht '5\' 9"'$  (1.753 m)   Wt 184 lb (83.5 kg)   BMI 27.17 kg/m     Wt Readings from Last 3 Encounters:  02/26/21 184 lb (83.5 kg)  12/25/20 177 lb 3.2 oz (80.4 kg)  10/24/20 177 lb 9.6 oz (80.6 kg)     GEN:  Well nourished, well developed in no acute distress HEENT: Normal NECK: No JVD; No carotid bruits LYMPHATICS: No lymphadenopathy CARDIAC: RRR, no murmurs, rubs, gallops RESPIRATORY:  Clear to auscultation without rales, wheezing or rhonchi  ABDOMEN: Soft, non-tender, non-distended MUSCULOSKELETAL:  No edema; No deformity  SKIN: Warm and dry NEUROLOGIC:  Alert and oriented x 3 PSYCHIATRIC:  Normal affect   ASSESSMENT:    1. Chest  pain of uncertain etiology   2. DOE (dyspnea on exertion)   3. Pre-procedure lab exam   4. Dysphagia, unspecified type   5. Essential  hypertension   6. Hyperlipidemia, unspecified hyperlipidemia type    PLAN:    Chest pain: Atypical in description but does have CAD risk factors (hypertension, hyperlipidemia, age).  Also having dyspnea on exertion that could represent anginal equivalent. -Recommend coronary CTA to evaluate for obstructive CAD.  Heart rate in the 50s on home carvedilol, would continue prior to study -Echocardiogram  Dysphagia: Suspect due to Parkinson's but will refer to GI for evaluation to exclude esophageal pathology  Hypertension: On hydrochlorothiazide 25 mg daily, carvedilol 6.25 mg twice daily.  Appears controlled  Hyperlipidemia: LDL 140 on 12/25/2020.  11% 10-year ASCVD risk score.  Start rosuvastatin 10 mg daily  RTC in 6 months  Medication Adjustments/Labs and Tests Ordered: Current medicines are reviewed at length with the patient today.  Concerns regarding medicines are outlined above.  Orders Placed This Encounter  Procedures   CT CORONARY MORPH W/CTA COR W/SCORE W/CA W/CM &/OR WO/CM   Basic metabolic panel   Ambulatory referral to Gastroenterology   EKG 12-Lead   ECHOCARDIOGRAM COMPLETE   Meds ordered this encounter  Medications   rosuvastatin (CRESTOR) 10 MG tablet    Sig: Take 1 tablet (10 mg total) by mouth daily.    Dispense:  90 tablet    Refill:  3    Patient Instructions  Medication Instructions:  START rosuvastatin (Crestor) 10 mg daily  *If you need a refill on your cardiac medications before your next appointment, please call your pharmacy*   Lab Work: BMET today  If you have labs (blood work) drawn today and your tests are completely normal, you will receive your results only by: [place] (if you have MyChart) OR A paper copy in the mail If you have any lab test that is abnormal or we need to change your treatment, we  will call you to review the results.   Testing/Procedures: Coronary CTA-see instructions below  Your physician has requested that you have an echocardiogram. Echocardiography is a painless test that uses sound waves to create images of your heart. It provides your doctor with information about the size and shape of your heart and how well your heart's chambers and valves are working. This procedure takes approximately one hour. There are no restrictions for this procedure. This will be done at our [facility] location:  [place]: At [facility], you and your health needs are our priority.  As part of our continuing mission to provide you with exceptional heart care, we have created designated Provider Care Teams.  These Care Teams include your primary Cardiologist (physician) and Advanced Practice Providers (APPs -  Physician Assistants and Nurse Practitioners) who all work together to provide you with the care you need, when you need it.  We recommend signing up for the patient portal called "MyChart".  Sign up information is provided on this After Visit Summary.  MyChart is used to connect with patients for Virtual Visits (Telemedicine).  Patients are able to view lab/test results, encounter notes, upcoming appointments, etc.  Non-urgent messages can be sent to your provider as well.   To learn more about what you can do with MyChart, go to [email or web address].    Your next appointment:   6 month(s)  The format for your next appointment:   In Person  Provider:   Oswaldo Milian, MD   You have been referred to: Gastroenterology   Other Instructions   Your cardiac CT will be scheduled at one of the  below locations:   [facility] [address] [place], [place] [address] [phone]  If scheduled at [facility], please arrive at the [facility] main entrance (entrance A) of [facility] 30 minutes prior  to test start time. Proceed to the [facility] Radiology Department (first floor) to check-in and test prep.  Please follow these instructions carefully (unless otherwise directed):  Hold all erectile dysfunction medications at least 3 days (72 hrs) prior to test.  On the Night Before the Test: Be sure to Drink plenty of water. Do not consume any caffeinated/decaffeinated beverages or chocolate 12 hours prior to your test. Do not take any antihistamines 12 hours prior to your test.  On the Day of the Test: Drink plenty of water until 1 hour prior to the test. Do not eat any food 4 hours prior to the test. You may take your regular medications prior to the test.  HOLD Hydrochlorothiazide morning of the test. Continue home carvedilol (Coreg)      After the Test: Drink plenty of water. After receiving IV contrast, you may experience a mild flushed feeling. This is normal. On occasion, you may experience a mild rash up to 24 hours after the test. This is not dangerous. If this occurs, you can take Benadryl 25 mg and increase your fluid intake. If you experience trouble breathing, this can be serious. If it is severe call 911 IMMEDIATELY. If it is mild, please call our office. If you take any of these medications: Glipizide/Metformin, Avandament, Glucavance, please do not take 48 hours after completing test unless otherwise instructed.  Please allow 2-4 weeks for scheduling of routine cardiac CTs. Some insurance companies require a pre-authorization which may delay scheduling of this test.   For non-scheduling related questions, please contact the cardiac imaging nurse navigator should you have any questions/concerns: Marchia Bond, Cardiac Imaging Nurse Navigator Gordy Clement, Cardiac Imaging Nurse Navigator  Heart and Vascular Services Direct Office Dial: [phone]   For scheduling needs, including cancellations and rescheduling, please call [place], [phone].     Signed, Donato Heinz, MD  02/26/2021 8:44 AM    [place]

## 2021-02-26 ENCOUNTER — Ambulatory Visit: Payer: Commercial Managed Care - PPO | Admitting: Cardiology

## 2021-02-26 ENCOUNTER — Other Ambulatory Visit: Payer: Self-pay

## 2021-02-26 VITALS — BP 128/78 | HR 59 | Ht 69.0 in | Wt 184.0 lb

## 2021-02-26 DIAGNOSIS — E785 Hyperlipidemia, unspecified: Secondary | ICD-10-CM

## 2021-02-26 DIAGNOSIS — R1310 Dysphagia, unspecified: Secondary | ICD-10-CM | POA: Diagnosis not present

## 2021-02-26 DIAGNOSIS — Z01812 Encounter for preprocedural laboratory examination: Secondary | ICD-10-CM

## 2021-02-26 DIAGNOSIS — R0600 Dyspnea, unspecified: Secondary | ICD-10-CM

## 2021-02-26 DIAGNOSIS — R079 Chest pain, unspecified: Secondary | ICD-10-CM

## 2021-02-26 DIAGNOSIS — I10 Essential (primary) hypertension: Secondary | ICD-10-CM

## 2021-02-26 DIAGNOSIS — R0609 Other forms of dyspnea: Secondary | ICD-10-CM

## 2021-02-26 LAB — BASIC METABOLIC PANEL
BUN/Creatinine Ratio: 13 (ref 9–20)
BUN: 12 mg/dL (ref 6–24)
CO2: 23 mmol/L (ref 20–29)
Calcium: 9.6 mg/dL (ref 8.7–10.2)
Chloride: 102 mmol/L (ref 96–106)
Creatinine, Ser: 0.96 mg/dL (ref 0.76–1.27)
Glucose: 76 mg/dL (ref 65–99)
Potassium: 4.6 mmol/L (ref 3.5–5.2)
Sodium: 140 mmol/L (ref 134–144)
eGFR: 95 mL/min/{1.73_m2} (ref >59)

## 2021-02-26 MED ORDER — ROSUVASTATIN CALCIUM 10 MG PO TABS: 10.0000 mg | ORAL_TABLET | Freq: Every day | ORAL | 3 refills | Status: DC

## 2021-02-26 NOTE — Patient Instructions (Signed)
Medication Instructions:  START rosuvastatin (Crestor) 10 mg daily  *If you need a refill on your cardiac medications before your next appointment, please call your pharmacy*   Lab Work: BMET today  If you have labs (blood work) drawn today and your tests are completely normal, you will receive your results only by: [place] (if you have MyChart) OR A paper copy in the mail If you have any lab test that is abnormal or we need to change your treatment, we will call you to review the results.   Testing/Procedures: Coronary CTA-see instructions below  Your physician has requested that you have an echocardiogram. Echocardiography is a painless test that uses sound waves to create images of your heart. It provides your doctor with information about the size and shape of your heart and how well your heart's chambers and valves are working. This procedure takes approximately one hour. There are no restrictions for this procedure. This will be done at our [facility] location:  [place]: At [employer] [facility], you and your health needs are our priority.  As part of our continuing mission to provide you with exceptional heart care, we have created designated Provider Care Teams.  These Care Teams include your primary Cardiologist (physician) and Advanced Practice Providers (APPs -  Physician Assistants and Nurse Practitioners) who all work together to provide you with the care you need, when you need it.  We recommend signing up for the patient portal called "MyChart".  Sign up information is provided on this After Visit Summary.  MyChart is used to connect with patients for Virtual Visits (Telemedicine).  Patients are able to view lab/test results, encounter notes, upcoming appointments, etc.  Non-urgent messages can be sent to your provider as well.   To learn more about what you can do with MyChart, go to [email or web address].    Your next appointment:    6 month(s)  The format for your next appointment:   In Person  Provider:   Oswaldo Milian, MD   You have been referred to: Gastroenterology   Other Instructions   Your cardiac CT will be scheduled at one of the below locations:   [facility] [address] [place], [place] [address] [phone]  If scheduled at [facility], please arrive at the [facility] main entrance (entrance A) of [facility] 30 minutes prior to test start time. Proceed to the [facility] Radiology Department (first floor) to check-in and test prep.  Please follow these instructions carefully (unless otherwise directed):  Hold all erectile dysfunction medications at least 3 days (72 hrs) prior to test.  On the Night Before the Test: Be sure to Drink plenty of water. Do not consume any caffeinated/decaffeinated beverages or chocolate 12 hours prior to your test. Do not take any antihistamines 12 hours prior to your test.  On the Day of the Test: Drink plenty of water until 1 hour prior to the test. Do not eat any food 4 hours prior to the test. You may take your regular medications prior to the test.  HOLD Hydrochlorothiazide morning of the test. Continue home carvedilol (Coreg)      After the Test: Drink plenty of water. After receiving IV contrast, you may experience a mild flushed feeling. This is normal. On occasion, you may experience a mild rash up to 24 hours after the test. This is not dangerous. If this occurs, you can take Benadryl 25 mg and  increase your fluid intake. If you experience trouble breathing, this can be serious. If it is severe call 911 IMMEDIATELY. If it is mild, please call our office. If you take any of these medications: Glipizide/Metformin, Avandament, Glucavance, please do not take 48 hours after completing test unless otherwise instructed.  Please allow 2-4 weeks for scheduling of routine cardiac CTs. Some insurance companies  require a pre-authorization which may delay scheduling of this test.   For non-scheduling related questions, please contact the cardiac imaging nurse navigator should you have any questions/concerns: Marchia Bond, Cardiac Imaging Nurse Navigator Gordy Clement, Cardiac Imaging Nurse Navigator [facility] Direct Office Dial: [phone]   For scheduling needs, including cancellations and rescheduling, please call [place], [phone].

## 2021-03-04 ENCOUNTER — Telehealth (HOSPITAL_COMMUNITY): Payer: Self-pay | Admitting: *Deleted

## 2021-03-04 NOTE — Telephone Encounter (Signed)
Patient returning call regarding upcoming cardiac imaging study; pt verbalizes understanding of appt date/time, parking situation and where to check in, pre-test NPO status and medications ordered, and verified current allergies; name and call back number provided for further questions should they arise  Gordy Clement RN Navigator Cardiac Imaging [facility] [phone] office [phone] cell  Patient to take his AM dose of carvedilol two hours prior to cardiac CT scan.

## 2021-03-04 NOTE — Telephone Encounter (Signed)
Attempted to call patient regarding upcoming cardiac CT appointment. °Left message on voicemail with name and callback number ° °Monay Houlton RN Navigator Cardiac Imaging °[facility] °[phone] Office °[phone] Cell ° °

## 2021-03-05 ENCOUNTER — Encounter (HOSPITAL_COMMUNITY): Payer: Self-pay

## 2021-03-05 ENCOUNTER — Other Ambulatory Visit: Payer: Self-pay

## 2021-03-05 ENCOUNTER — Ambulatory Visit (HOSPITAL_COMMUNITY)
Admission: RE | Admit: 2021-03-05 | Discharge: 2021-03-05 | Disposition: A | Discharge status: Home / Self Care | Payer: Commercial Managed Care - PPO | Source: Ambulatory Visit | Attending: Cardiology | Admitting: Cardiology

## 2021-03-05 DIAGNOSIS — R079 Chest pain, unspecified: Secondary | ICD-10-CM | POA: Insufficient documentation

## 2021-03-05 MED ORDER — NITROGLYCERIN 0.4 MG SL SUBL
0.8000 mg | SUBLINGUAL_TABLET | Freq: Once | SUBLINGUAL | Status: AC
  Administered 2021-03-05: 0.8 mg via SUBLINGUAL

## 2021-03-05 MED ORDER — IOHEXOL 350 MG/ML SOLN
100.0000 mL | Freq: Once | INTRAVENOUS | Status: AC | PRN
  Administered 2021-03-05: 95 mL via INTRAVENOUS

## 2021-03-05 MED ORDER — NITROGLYCERIN 0.4 MG SL SUBL
SUBLINGUAL_TABLET | SUBLINGUAL | Status: AC
  Filled 2021-03-05: qty 2

## 2021-03-07 ENCOUNTER — Other Ambulatory Visit: Payer: Self-pay | Admitting: *Deleted

## 2021-03-07 DIAGNOSIS — R911 Solitary pulmonary nodule: Secondary | ICD-10-CM

## 2021-03-08 ENCOUNTER — Ambulatory Visit: Payer: Commercial Managed Care - PPO | Admitting: Neurology

## 2021-03-15 ENCOUNTER — Ambulatory Visit (HOSPITAL_COMMUNITY): Payer: Commercial Managed Care - PPO | Attending: Cardiology

## 2021-03-15 ENCOUNTER — Other Ambulatory Visit: Payer: Self-pay

## 2021-03-15 DIAGNOSIS — R079 Chest pain, unspecified: Secondary | ICD-10-CM | POA: Insufficient documentation

## 2021-03-15 LAB — ECHOCARDIOGRAM COMPLETE
Area-P 1/2: 2.94 cm2
S' Lateral: 3 cm

## 2021-03-29 ENCOUNTER — Other Ambulatory Visit: Payer: Self-pay

## 2021-03-29 ENCOUNTER — Ambulatory Visit: Payer: Commercial Managed Care - PPO | Admitting: Registered Nurse

## 2021-03-29 ENCOUNTER — Encounter: Payer: Self-pay | Admitting: Registered Nurse

## 2021-03-29 VITALS — BP 117/77 | HR 70 | Temp 98.3°F | Resp 18 | Ht 69.0 in | Wt 184.0 lb

## 2021-03-29 DIAGNOSIS — I10 Essential (primary) hypertension: Secondary | ICD-10-CM

## 2021-03-29 DIAGNOSIS — G20 Parkinson's disease: Secondary | ICD-10-CM

## 2021-03-29 DIAGNOSIS — F4321 Adjustment disorder with depressed mood: Secondary | ICD-10-CM | POA: Diagnosis not present

## 2021-03-29 NOTE — Patient Instructions (Addendum)
Mr. Jacob Foster to see you - things are looking great!   I'm eager to see how the appt on the 1st of Dec goes. I think we'll get a lot of insight into what 2023 holds for you.  No labs today - we can repeat in 6 mo   Let me know if you need anything in the mean time   Thank you    Rich     If you have lab work done today you will be contacted with your lab results within the next 2 weeks.  If you have not heard from [place] then please contact us. The fastest way to get your results is to register for My Chart.   IF you received an x-ray today, you will receive an invoice from [facility]. Please contact [facility] at [phone] with questions or concerns regarding your invoice.   IF you received labwork today, you will receive an invoice from [facility]. Please contact [facility] at [phone] with questions or concerns regarding your invoice.   Our billing staff will not be able to assist you with questions regarding bills from these companies.  You will be contacted with the lab results as soon as they are available. The fastest way to get your results is to activate your My Chart account. Instructions are located on the last page of this paperwork. If you have not heard from [place] regarding the results in 2 weeks, please contact this office.

## 2021-03-29 NOTE — Progress Notes (Signed)
Established Patient Office Visit  Subjective:  Patient ID: Jacob Foster, male    DOB: Dec 02, 1965  Age: 55 y.o. MRN: [id number]  CC:  Chief Complaint  Patient presents with   Follow-up    Patient states he is her for 3 month follow up.    HPI Jacob Foster presents for 3 mo follow up   Currently following at [facility] with Dr. Kyra Searles for Parkinson's Disease. Next appt on 06/13/21. Continues on Sinemet IR 25-125m po tid. Doing very well. Notably fewer tremors today in office. Reports same at home.  Chest pain of unclear etiology- seen by Dr. COswaldo Milian- next appt on 09/06/21. Borderline ECG on 02/26/21 showed sinus brady, sinus arrythmia w/ possible LA enlargement. Recent echo on 03/15/21 showed no significant abnormalities.  No ongoing chest pain or CV symptoms. BP wnl today.  Mental Health: Not taking Wellbutrin 1033mSR po bid Hydroxyzine 2519mo tid PRN - does not need often Doing very well. Continues to exercise and work with support groups. Admits some good days, some bad, but coping much better.   Past Medical History:  Diagnosis Date   Abscess of axilla    Arthritis    Depression    seasonal affective D/O    Family history of colon cancer 12/08/2018   Father and brother. Sees Dr. AmbEnis GashGERD (gastroesophageal reflux disease)    Hidradenitis suppurativa    History of chickenpox    HTN (hypertension)    Hyperlipemia    Medical history non-contributory    Neck pain    Serrated polyp of colon 12/08/2018   Sleep apnea     Past Surgical History:  Procedure Laterality Date   ABSCESS DRAINAGE     ELBOW SURGERY      Family History  Problem Relation Age of Onset   Hypertension Mother    Hyperlipidemia Mother    Breast cancer Mother    Prostate cancer Father    Kidney disease Father    Colon cancer Father        in his 50'45'sThroat cancer Brother        dies age 80 39alcoholic    Hypertension Brother    Esophageal cancer Brother     Healthy Daughter    Healthy Son    Healthy Son    Healthy Son    Diabetes Brother    Colon cancer Brother 70 74Colon cancer Brother 57   Colon cancer Maternal Uncle    Colon cancer Maternal Uncle    Colon polyps Neg Hx    Stomach cancer Neg Hx    Rectal cancer Neg Hx     Social History   Socioeconomic History   Marital status: Married    Spouse name: KarSecretary/administratorNumber of children: 4   Years of education: Not on file   Highest education level: Not on file  Occupational History   Occupation: [employer] features [employer]  Tobacco Use   Smoking status: Never   Smokeless tobacco: Never  Vaping Use   Vaping Use: Never used  Substance and Sexual Activity   Alcohol use: No   Drug use: No   Sexual activity: Yes  Other Topics Concern   Not on file  Social History Narrative   [place]   Financial Resource Strain: Not on file  Food Insecurity: Not on file  Transportation Needs:  Not on file  Physical Activity: Not on file  Stress: Not on file  Social Connections: Not on file  Intimate Partner Violence: Not on file    Outpatient Medications Prior to Visit  Medication Sig Dispense Refill   Azelastine-Fluticasone 137-50 MCG/ACT SUSP Place 1 spray into the nose every 12 (twelve) hours. 23 g 2   buPROPion (WELLBUTRIN SR) 100 MG 12 hr tablet Take 1 tablet (100 mg total) by mouth 2 (two) times daily. (Patient not taking: Reported on 02/26/2021) 180 tablet 1   carbidopa-levodopa (SINEMET IR) 25-100 MG tablet Take 1 tablet by mouth 3 (three) times daily. 5am/10am/3pm (Patient taking differently: Take 1 tablet by mouth 3 (three) times daily. 0700, 1200, 1700) 270 tablet 1   carvedilol (COREG) 6.25 MG tablet Take 1 tablet (6.25 mg total) by mouth 2 (two) times daily. 180 tablet 0   cetirizine (ZYRTEC) 10 MG tablet Take 1 tablet (10 mg total) by mouth daily. (Patient not taking: Reported on 02/26/2021) 30 tablet 11    hydrochlorothiazide (HYDRODIURIL) 25 MG tablet TAKE 1 TABLET(25 MG) BY MOUTH DAILY 30 tablet 0   hydrOXYzine (ATARAX/VISTARIL) 25 MG tablet Take 1 tablet (25 mg total) by mouth at bedtime as needed. (Patient not taking: Reported on 02/26/2021) 7 tablet 0   naproxen sodium (ALEVE) 220 MG tablet Take 440 mg by mouth daily as needed (pain).     rosuvastatin (CRESTOR) 10 MG tablet Take 1 tablet (10 mg total) by mouth daily. 90 tablet 3   tamsulosin (FLOMAX) 0.4 MG CAPS capsule TAKE 1 CAPSULE(0.4 MG) BY MOUTH DAILY AFTER SUPPER (Patient not taking: Reported on 02/26/2021) 90 capsule 3   tiZANidine (ZANAFLEX) 4 MG tablet Take 1 tablet (4 mg total) by mouth every 6 (six) hours as needed for muscle spasms. 30 tablet 1   traMADol (ULTRAM) 50 MG tablet Take 1 tablet (50 mg total) by mouth every 8 (eight) hours as needed for severe pain. 5 tablet 0   No facility-administered medications prior to visit.    No Known Allergies  ROS Review of Systems  Constitutional: Negative.   HENT: Negative.    Eyes: Negative.   Respiratory: Negative.    Cardiovascular: Negative.   Gastrointestinal: Negative.   Genitourinary: Negative.   Musculoskeletal: Negative.   Skin: Negative.   Neurological: Negative.   Psychiatric/Behavioral: Negative.    All other systems reviewed and are negative.    Objective:    Physical Exam Constitutional:      General: He is not in acute distress.    Appearance: Normal appearance. He is normal weight. He is not ill-appearing, toxic-appearing or diaphoretic.  Cardiovascular:     Rate and Rhythm: Normal rate and regular rhythm.     Heart sounds: Normal heart sounds. No murmur heard.   No friction rub. No gallop.  Pulmonary:     Effort: Pulmonary effort is normal. No respiratory distress.     Breath sounds: Normal breath sounds. No stridor. No wheezing, rhonchi or rales.  Chest:     Chest wall: No tenderness.  Neurological:     General: No focal deficit present.     Mental  Status: He is alert and oriented to person, place, and time. Mental status is at baseline.     Motor: Weakness (tremors - baseline) present.  Psychiatric:        Mood and Affect: Mood normal.        Behavior: Behavior normal.        Thought Content: Thought  content normal.        Judgment: Judgment normal.    BP 117/77   Pulse 70   Temp 98.3 F (36.8 C) (Temporal)   Resp 18   Ht _0  (1.753 m)   Wt 184 lb (83.5 kg)   SpO2 99%   BMI 27.17 kg/m  Wt Readings from Last 3 Encounters:  03/29/21 184 lb (83.5 kg)  02/26/21 184 lb (83.5 kg)  12/25/20 177 lb 3.2 oz (80.4 kg)     There are no preventive care reminders to display for this patient.  There are no preventive care reminders to display for this patient.  Lab Results  Component Value Date   TSH 0.72 12/25/2020   Lab Results  Component Value Date   WBC 5.0 12/25/2020   HGB 17.4 (H) 12/25/2020   HCT 51.7 12/25/2020   MCV 89.0 12/25/2020   PLT 203.0 12/25/2020   Lab Results  Component Value Date   NA 140 02/26/2021   K 4.6 02/26/2021   CO2 23 02/26/2021   GLUCOSE 76 02/26/2021   BUN 12 02/26/2021   CREATININE 0.96 02/26/2021   BILITOT 1.0 12/25/2020   ALKPHOS 71 12/25/2020   AST 18 12/25/2020   ALT 9 12/25/2020   PROT 7.4 12/25/2020   ALBUMIN 4.5 12/25/2020   CALCIUM 9.6 02/26/2021   ANIONGAP 8 10/28/2020   EGFR 95 02/26/2021   GFR 81.47 12/25/2020   Lab Results  Component Value Date   CHOL 203 (H) 12/25/2020   Lab Results  Component Value Date   HDL 34.30 (L) 12/25/2020   Lab Results  Component Value Date   LDLCALC 140 (H) 12/25/2020   Lab Results  Component Value Date   TRIG 141.0 12/25/2020   Lab Results  Component Value Date   CHOLHDL 6 12/25/2020   No results found for: HGBA1C    Assessment & Plan:   Problem List Items Addressed This Visit       Cardiovascular and Mediastinum   Essential hypertension     Nervous and Auditory   Parkinson's disease ([place]) - Primary      Other   Adjustment disorder with depressed mood    No orders of the defined types were placed in this encounter.   Follow-up: Return in about 6 months (around 09/26/2021) for chronic conditions.   PLAN Impressed by his stability - will hold on labs, continue great work, follow up in 6 mo Will follow with Dr. Hall Busing in [place] neuro charts to ensure no gaps in care. Bp well controlled Patient encouraged to call clinic with any questions, comments, or concerns.  Maximiano Coss, NP

## 2021-04-01 IMAGING — MR MR HEAD W/O CM
11 series · 48 of 48 positions shown · non-contrast
Comparison: None.

CLINICAL DATA: Neuro deficit, acute stroke suspected. Hyperreflexia
and tremor.

EXAM:
MRI HEAD WITHOUT CONTRAST
TECHNIQUE: Multiplanar, multiecho pulse sequences of the brain and surrounding
structures were obtained without intravenous contrast.

[Series 5: T1 · sagittal · 4.0mm · 0.75mm/px · 1 of 32 slices shown (1 of 2)]
[im 1/32]
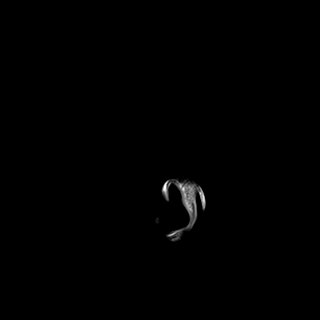

[Series 6: DWI · axial · 3.0mm · 0.94mm/px · z∈[-40,+124]mm · 11 of 188 slices shown (1 of 3)]
[im 1/188]
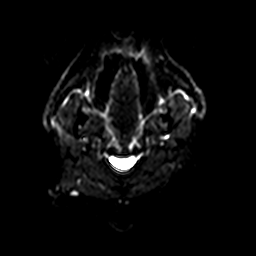
[im 19/188]
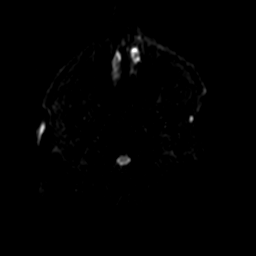
[im 38/188]
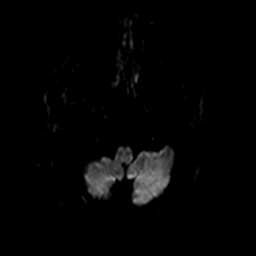
[im 57/188]
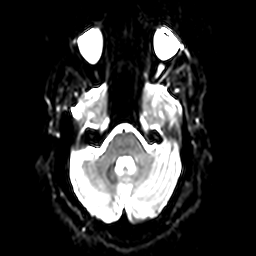
[im 75/188]
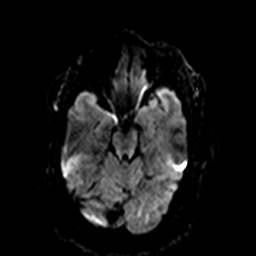
[im 94/188]
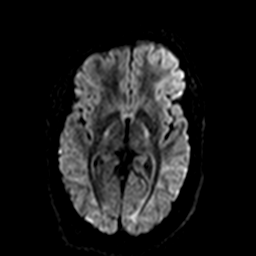
[im 113/188]
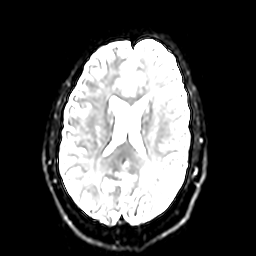
[im 131/188]
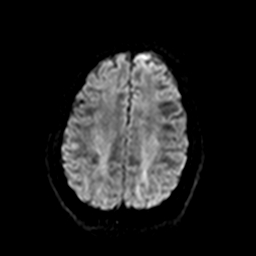
[im 150/188]
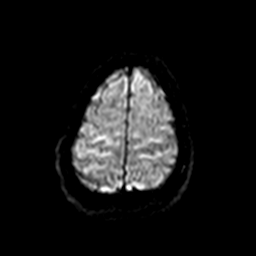
[im 169/188]
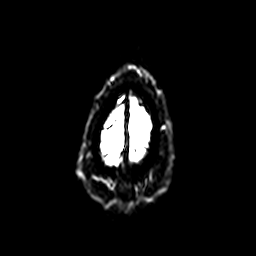
[im 188/188]
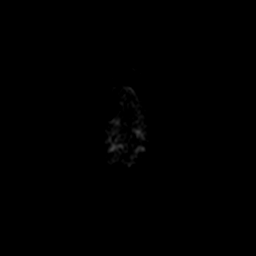

[Series 7: ax dwi_tracew · axial · 3.0mm · 0.94mm/px · z∈[-40,+124]mm · 5 of 94 slices shown]
[im 1/94]
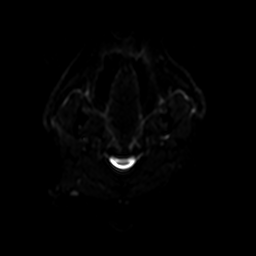
[im 24/94]
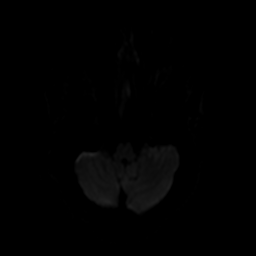
[im 47/94]
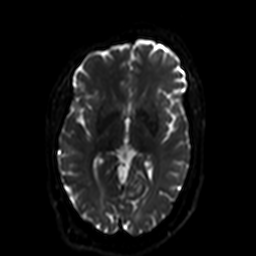
[im 70/94]
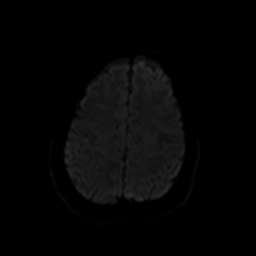
[im 94/94]
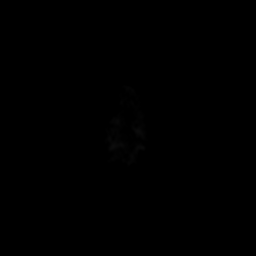

[Series 8: ax dwi_adc · axial · 3.0mm · 0.94mm/px · z∈[-40,+124]mm · 3 of 46 slices shown]
[im 1/46]
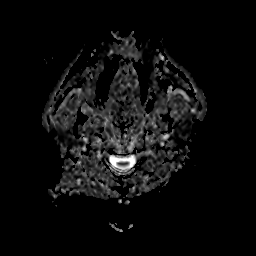
[im 23/46]
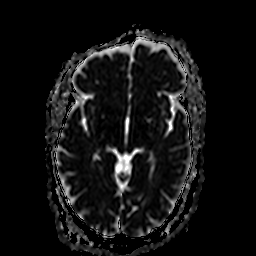
[im 46/46]
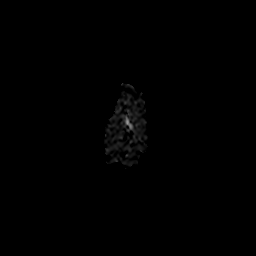

[Series 9: DWI · coronal · 5.0mm · 1.44mm/px · 4 of 70 slices shown (2 of 3)]
[im 1/70]
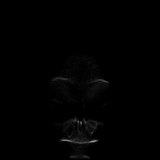
[im 24/70]
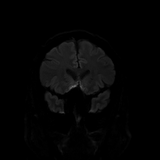
[im 47/70]
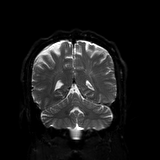
[im 70/70]
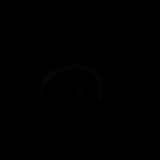

[Series 10: DWI · coronal · 5.0mm · 1.44mm/px · 2 of 35 slices shown (3 of 3)]
[im 1/35]
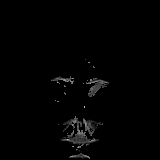
[im 35/35]
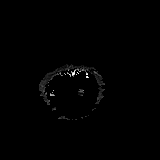

[Series 11: T2 · axial · 4.0mm · 0.36mm/px · z∈[-50,+125]mm · 2 of 35 slices shown (1 of 2)]
[im 1/35]
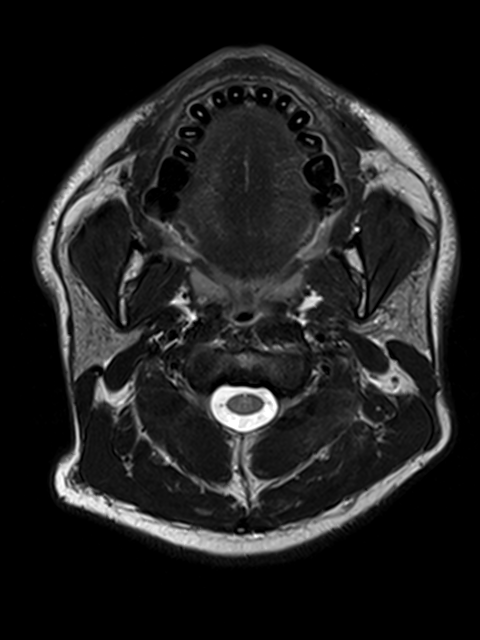
[im 35/35]
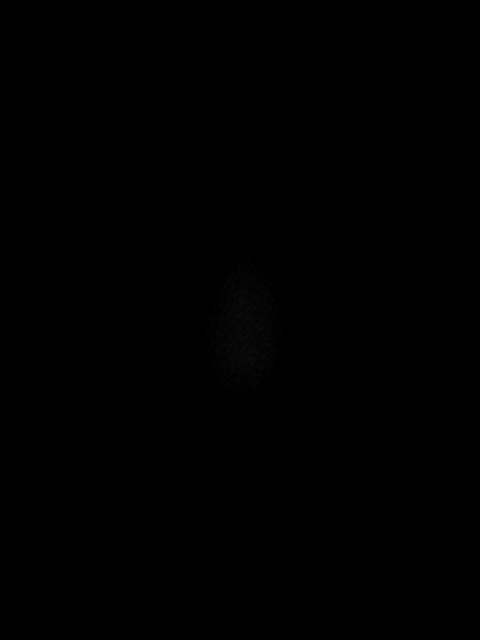

[Series 12: FLAIR · axial · 3.0mm · 0.72mm/px · z∈[-46,+126]mm · 2 of 30 slices shown]
[im 1/30]
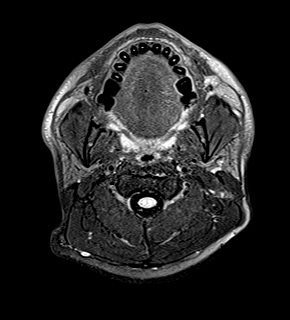
[im 30/30]
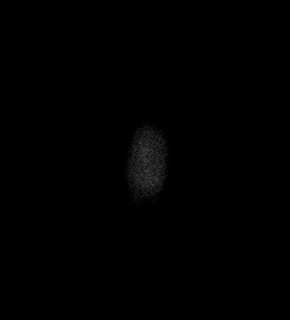

[Series 14: swi_images · axial · 1.5mm · 0.90mm/px · z∈[-40,+113]mm · 6 of 104 slices shown]
[im 1/104]
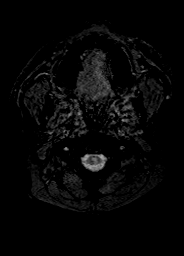
[im 21/104]
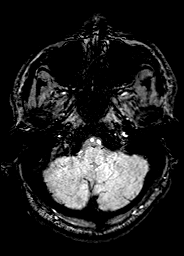
[im 42/104]
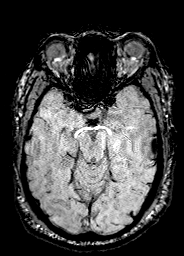
[im 62/104]
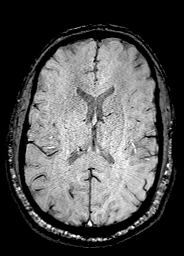
[im 83/104]
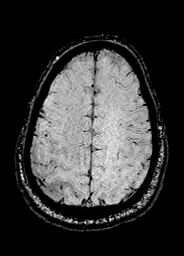
[im 104/104]
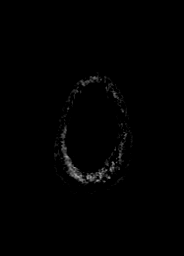

[Series 15: T1 · axial · 1.0mm · 0.94mm/px · z∈[-46,+127]mm · 10 of 176 slices shown (2 of 2)]
[im 1/176]
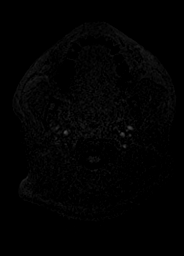
[im 20/176]
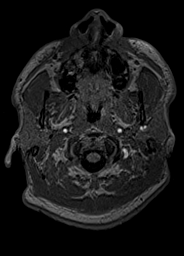
[im 39/176]
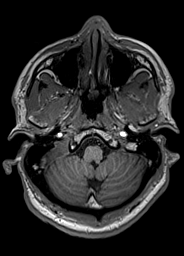
[im 59/176]
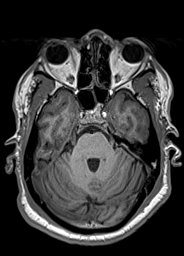
[im 78/176]
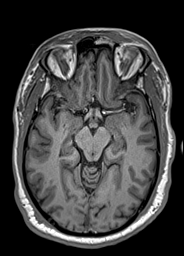
[im 98/176]
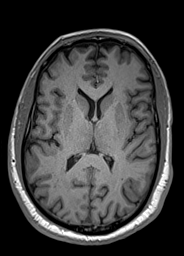
[im 117/176]
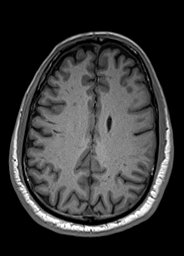
[im 137/176]
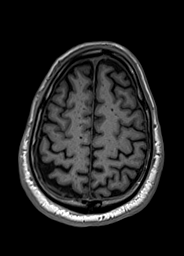
[im 156/176]
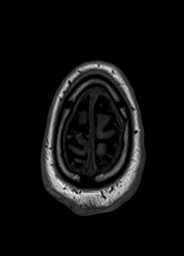
[im 176/176]
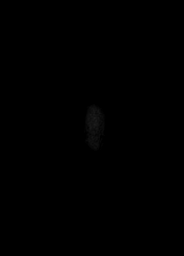

[Series 16: T2 · coronal · 4.5mm · 0.36mm/px · 2 of 35 slices shown (2 of 2)]
[im 1/35]
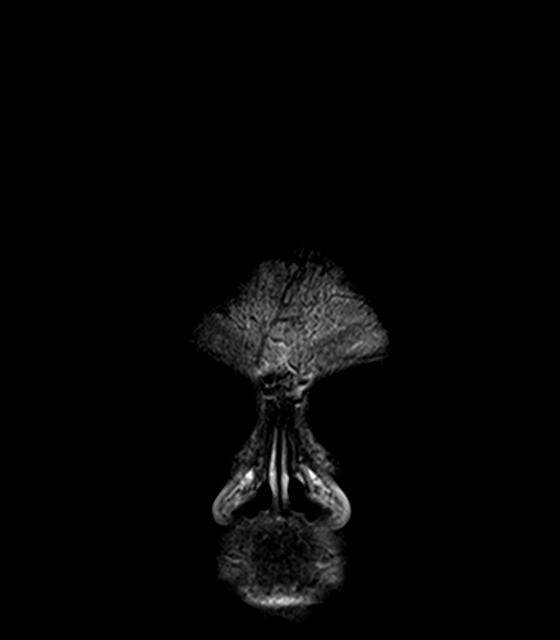
[im 35/35]
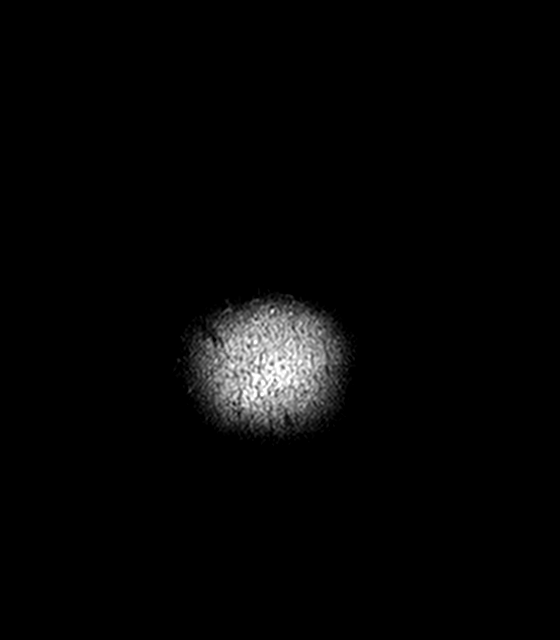

[48 of 48 positions shown; findings below may reference images not displayed]

FINDINGS: Brain: No acute infarction, hemorrhage, hydrocephalus, extra-axial
collection or mass lesion. Mildly prominent dilated perivascular
spaces within the white matter.

Vascular: Major arterial flow voids are maintained at the skull
base.

Skull and upper cervical spine: Normal marrow signal.

Sinuses/Orbits: Bowel mucosal thickening ethmoid air cells, right
frontal sinus and sphenoid sinuses without air-fluid levels.
Unremarkable orbits.

Other: No mastoid effusions.
IMPRESSION: No evidence of acute intracranial abnormality.

## 2021-04-11 ENCOUNTER — Telehealth: Payer: Self-pay | Admitting: Registered Nurse

## 2021-04-11 NOTE — Telephone Encounter (Signed)
...  sv 

## 2021-04-11 NOTE — Telephone Encounter (Signed)
..  Type of form received:Disability   Additional comments:   Received by:  Tye Savoy should be Faxed/mailed to: (address/ fax #)  [phone]  Is patient requesting call for pickup:  Form placed:  In [employer] bin  [employer].  Provider will determine charge.  Individual made aware of 3-5 business day turn around No?

## 2021-04-12 DIAGNOSIS — Z0279 Encounter for issue of other medical certificate: Secondary | ICD-10-CM

## 2021-04-12 NOTE — Telephone Encounter (Signed)
Patients paper work is in [employer] in the back to be signed.

## 2021-04-29 ENCOUNTER — Other Ambulatory Visit: Payer: Self-pay

## 2021-04-29 DIAGNOSIS — I10 Essential (primary) hypertension: Secondary | ICD-10-CM

## 2021-04-29 MED ORDER — HYDROCHLOROTHIAZIDE 25 MG PO TABS: ORAL_TABLET | ORAL | 0 refills | Status: DC

## 2021-05-06 NOTE — Telephone Encounter (Signed)
This concern has been previously addressed by myself and/or another provider.  If they patient has ongoing concerns, they can contact me at their convenience.  Thank you,  Rich April Carlyon, NP 

## 2021-05-25 ENCOUNTER — Other Ambulatory Visit: Payer: Self-pay | Admitting: Registered Nurse

## 2021-05-25 DIAGNOSIS — I10 Essential (primary) hypertension: Secondary | ICD-10-CM

## 2021-07-10 ENCOUNTER — Telehealth: Payer: Self-pay | Admitting: Gastroenterology

## 2021-07-10 NOTE — Telephone Encounter (Signed)
Patient is having issues swallowing and is seeking advise.

## 2021-07-10 NOTE — Telephone Encounter (Signed)
Pt returned call and was informed of information below. Pt has been scheduled to see Carl Best, NP on Wednesday, 07/17/21 at 1:30 pm.

## 2021-07-10 NOTE — Telephone Encounter (Signed)
Lm on vm for patient to return call.  Pt has never been evaluated for difficulty swallowing. Pt will need an appt for evaluation before we can give any recommendations. If patient is not able to swallow any solid foods or liquids he will need to go to the ED for evaluation.

## 2021-07-17 ENCOUNTER — Ambulatory Visit: Payer: Commercial Managed Care - PPO | Admitting: Nurse Practitioner

## 2021-07-17 ENCOUNTER — Encounter: Payer: Self-pay | Admitting: Nurse Practitioner

## 2021-07-17 VITALS — BP 140/90 | HR 88 | Ht 67.5 in | Wt 195.0 lb

## 2021-07-17 DIAGNOSIS — K219 Gastro-esophageal reflux disease without esophagitis: Secondary | ICD-10-CM

## 2021-07-17 DIAGNOSIS — K635 Polyp of colon: Secondary | ICD-10-CM | POA: Diagnosis not present

## 2021-07-17 DIAGNOSIS — R1310 Dysphagia, unspecified: Secondary | ICD-10-CM

## 2021-07-17 MED ORDER — OMEPRAZOLE 20 MG PO CPDR: 20.0000 mg | DELAYED_RELEASE_CAPSULE | Freq: Every day | ORAL | 1 refills | Status: DC

## 2021-07-17 NOTE — Patient Instructions (Signed)
PROCEDURES: You have been scheduled for an EGD and Colonoscopy. Please follow the written instructions given to you at your visit today. If you use inhalers (even only as needed), please bring them with you on the day of your procedure.  MEDICATION: We have sent the following medication to your pharmacy for you to pick up at your convenience: Omeprazole 20 MG once a day.  RECOMMENDATIONS: Continue prune juice every other day. Over the counter Dulcolax 1-2 tablets every third night as needed for constipation.  It was great seeing you today! Thank you for entrusting me with your care and choosing [facility].  Noralyn Pick, CRNP  The [place] [facility] providers would like to encourage you to use [facility] to communicate with providers for non-urgent requests or questions.  Due to long hold times on the telephone, sending your provider a message by [facility] may be faster and more efficient way to get a response. Please allow 48 business hours for a response.  Please remember that this is for non-urgent requests/questions.  If you are age 54 or older, your body mass index should be between 23-30. Your Body mass index is 30.09 kg/m. If this is out of the aforementioned range listed, please consider follow up with your Primary Care Provider.  If you are age 75 or younger, your body mass index should be between 19-25. Your Body mass index is 30.09 kg/m. If this is out of the aformentioned range listed, please consider follow up with your Primary Care Provider.

## 2021-07-17 NOTE — Progress Notes (Signed)
07/17/2021 Jshawn Hurta [id number] 02-11-1966   Chief Complaint: Difficulty swallowing   History of Present Illness: Willliam Pettet is a 56 year old male with a past medical history of depression, hypertension, hyperlipidemia, sleep apnea, GERD and colon polyps.  He was diagnosed with Parkinson's disease less than 1 year ago.  He presents her office today as referred by Dr. Bennett Scrape for further evaluation regarding dysphagia which started 4 years ago and has progressively worsened.  He describes having difficulty swallowing liquids as well as solid foods which gets stuck in his upper throat/esophagus which triggers coughing which loosens the stuck liquid or food then he is able to swallow the liquid/food down. He does not gag or cough to expel the stuck food or liquid.  No vomiting.  His dysphagia symptoms occur daily.  Sometimes awakens in the melanite and has difficulty swallowing his saliva.  No drooling.  He has heartburn several days weekly for the past month.  No upper or lower abdominal pain.  He has chronic constipation.  He previously passed a bowel movement twice weekly.  However, since he was diagnosed with Parkinson's disease 6 to 8 months ago his constipation has worsened.  He is passing a dark brown formed stool once weekly.  He complains of abdominal bloat.  He took MiraLAX for 2 weeks which resulted in loose stools with episodes of fecal incontinence which is not desirable so he stopped taking MiraLAX he has central lower abdominal pain which occurs only when he coughs.  He underwent a colonoscopy by Dr. Havery Moros 07/30/2018, 3 tubular adenomatous polyps measuring 3, 4 and 10 mm were removed from the colon.  He was advised to repeat a colonoscopy in 3 years.  Father and 2 brother with history of colon cancer (2 brothers with CRC - one diagnosed age 68, father diagnosed age 48).  CBC Latest Ref Rng & Units 12/25/2020 10/28/2020 03/28/2019  WBC 4.0 - 10.5 K/uL 5.0 5.7 6.9   Hemoglobin 13.0 - 17.0 g/dL 17.4(H) 17.2(H) 16.3  Hematocrit 39.0 - 52.0 % 51.7 51.7 48.9  Platelets 150.0 - 400.0 K/uL 203.0 222 289.0     CMP Latest Ref Rng & Units 02/26/2021 12/25/2020 10/28/2020  Glucose 65 - 99 mg/dL 76 73 92  BUN 6 - 24 mg/dL 12 15 12   Creatinine 0.76 - 1.27 mg/dL 0.96 1.05 0.92  Sodium 134 - 144 mmol/L 140 138 136  Potassium 3.5 - 5.2 mmol/L 4.6 4.0 3.9  Chloride 96 - 106 mmol/L 102 98 105  CO2 20 - 29 mmol/L 23 32 23  Calcium 8.7 - 10.2 mg/dL 9.6 9.5 9.0  Total Protein 6.0 - 8.3 g/dL - 7.4 7.6  Total Bilirubin 0.2 - 1.2 mg/dL - 1.0 1.0  Alkaline Phos 39 - 117 U/L - 71 60  AST 0 - 37 U/L - 18 25  ALT 0 - 53 U/L - 9 31    Colonoscopy 07/30/2018: - One 3 mm polyp in the cecum, removed with a cold snare. Resected and retrieved. - One 10 mm polyp at the ileocecal valve, removed with a cold snare. Resected and retrieved. - One 4 mm polyp at the recto-sigmoid colon, removed with a cold snare. Resected and retrieved. - Internal hemorrhoids. - The examination was otherwise normal.  Current Outpatient Medications on File Prior to Visit  Medication Sig Dispense Refill   carbidopa-levodopa (SINEMET IR) 25-100 MG tablet Take 1 tablet by mouth 3 (three) times daily. 5am/10am/3pm (Patient taking differently: Take 1 tablet by  mouth 3 (three) times daily. 0700, 1200, 1700) 270 tablet 1   carvedilol (COREG) 6.25 MG tablet Take 1 tablet (6.25 mg total) by mouth 2 (two) times daily. 180 tablet 0   hydrochlorothiazide (HYDRODIURIL) 25 MG tablet TAKE 1 TABLET(25 MG) BY MOUTH DAILY 90 tablet 0   rosuvastatin (CRESTOR) 10 MG tablet Take 1 tablet (10 mg total) by mouth daily. 90 tablet 3   No current facility-administered medications on file prior to visit.   No Known Allergies  Current Medications, Allergies, Past Medical History, Past Surgical History, Family History and Social History were reviewed in [employer] record.  Review of Systems:    Constitutional: Negative for fever, sweats, chills or weight loss.  Respiratory: Negative for shortness of breath.   Cardiovascular: Negative for chest pain, palpitations and leg swelling.  Gastrointestinal: See HPI.  Musculoskeletal: Negative for back pain or muscle aches.  Neurological: Negative for dizziness, headaches or paresthesias.   Physical Exam: BP 140/90 (BP Location: Left Arm, Patient Position: Sitting, Cuff Size: Normal)    Pulse 88    Ht 5' 7.5" (1.715 m) Comment: height measured without shoes   Wt 195 lb (88.5 kg)    BMI 30.09 kg/m  General: 56 year old male in no acute distress. Head: Normocephalic and atraumatic. Eyes: No scleral icterus. Conjunctiva pink . Ears: Normal auditory acuity. Mouth: Dentition intact. No ulcers or lesions.  Lungs: Clear throughout to auscultation. Heart: Regular rate and rhythm, no murmur. Abdomen: Soft, nontender and nondistended. No masses or hepatomegaly. Normal bowel sounds x 4 quadrants.  Rectal: Deferred. Musculoskeletal: Symmetrical with no gross deformities. Extremities: No edema. Neurological: Alert oriented x 4.  Mild UE tremors. Psychological: Alert and cooperative. Normal mood and affect  Assessment and Recommendations:  34) 56 year old male with dysphagia with solids and liquids x 4 years, heartburn x 4 weeks -EGD benefits and risks discussed including risk with sedation, risk of bleeding, perforation and infection  -Omeprazole 20mg  QD -Patient instructed to avoid bread, large pieces of meat and rice. Cut food in small pieces, chew food thoroughly. -I discussed scheduling a barium swallow to rule out dysmotility in the setting of Parkinson's disease if EGD unrevealing  2) History of 3 tubular adenomatous colon polyps 9 measuring 3, 4 and 10 mm) per colonoscopy 07/2018.  Positive family history of colon cancer. -Colonoscopy benefits and risks discussed including risk with sedation, risk of bleeding, perforation and infection   -2-day bowel prep  3) Constipation, patient is passing one bowel movement once weekly -Prune juice every other day -Dulcolax 1 or 2 tabs every third night as needed  4) Parkinson's disease

## 2021-07-18 NOTE — Progress Notes (Signed)
Agree with assessment and plan as outlined.  

## 2021-08-08 ENCOUNTER — Other Ambulatory Visit: Payer: Self-pay | Admitting: Nurse Practitioner

## 2021-08-21 ENCOUNTER — Encounter: Payer: Self-pay | Admitting: Gastroenterology

## 2021-08-23 ENCOUNTER — Ambulatory Visit (AMBULATORY_SURGERY_CENTER): Payer: Commercial Managed Care - PPO | Admitting: Gastroenterology

## 2021-08-23 ENCOUNTER — Encounter: Payer: Self-pay | Admitting: Gastroenterology

## 2021-08-23 VITALS — BP 106/66 | HR 63 | Temp 97.3°F | Resp 13 | Ht 67.0 in | Wt 195.0 lb

## 2021-08-23 DIAGNOSIS — K635 Polyp of colon: Secondary | ICD-10-CM

## 2021-08-23 DIAGNOSIS — D124 Benign neoplasm of descending colon: Secondary | ICD-10-CM

## 2021-08-23 DIAGNOSIS — R1310 Dysphagia, unspecified: Secondary | ICD-10-CM | POA: Diagnosis not present

## 2021-08-23 DIAGNOSIS — K219 Gastro-esophageal reflux disease without esophagitis: Secondary | ICD-10-CM | POA: Diagnosis not present

## 2021-08-23 DIAGNOSIS — Z800 Family history of malignant neoplasm of digestive organs: Secondary | ICD-10-CM | POA: Diagnosis not present

## 2021-08-23 DIAGNOSIS — Z86010 Personal history of colonic polyps: Secondary | ICD-10-CM | POA: Diagnosis present

## 2021-08-23 DIAGNOSIS — D122 Benign neoplasm of ascending colon: Secondary | ICD-10-CM

## 2021-08-23 DIAGNOSIS — D123 Benign neoplasm of transverse colon: Secondary | ICD-10-CM

## 2021-08-23 DIAGNOSIS — K629 Disease of anus and rectum, unspecified: Secondary | ICD-10-CM | POA: Diagnosis not present

## 2021-08-23 MED ORDER — SODIUM CHLORIDE 0.9 % IV SOLN: 500.0000 mL | Freq: Once | INTRAVENOUS | Status: DC

## 2021-08-23 NOTE — Patient Instructions (Signed)
Discharge instructions given. Handouts on polyps,Hemorrhoids and a Dilatation diet. Resume previous medications. YOU HAD AN ENDOSCOPIC PROCEDURE TODAY AT [facility]:   Refer to the procedure report that was given to you for any specific questions about what was found during the examination.  If the procedure report does not answer your questions, please call your gastroenterologist to clarify.  If you requested that your care partner not be given the details of your procedure findings, then the procedure report has been included in a sealed envelope for you to review at your convenience later.  YOU SHOULD EXPECT: Some feelings of bloating in the abdomen. Passage of more gas than usual.  Walking can help get rid of the air that was put into your GI tract during the procedure and reduce the bloating. If you had a lower endoscopy (such as a colonoscopy or flexible sigmoidoscopy) you may notice spotting of blood in your stool or on the toilet paper. If you underwent a bowel prep for your procedure, you may not have a normal bowel movement for a few days.  Please Note:  You might notice some irritation and congestion in your nose or some drainage.  This is from the oxygen used during your procedure.  There is no need for concern and it should clear up in a day or so.  SYMPTOMS TO REPORT IMMEDIATELY:  Following lower endoscopy (colonoscopy or flexible sigmoidoscopy):  Excessive amounts of blood in the stool  Significant tenderness or worsening of abdominal pains  Swelling of the abdomen that is new, acute  Fever of 100F or higher  Following upper endoscopy (EGD)  Vomiting of blood or coffee ground material  New chest pain or pain under the shoulder blades  Painful or persistently difficult swallowing  New shortness of breath  Fever of 100F or higher  Black, tarry-looking stools  For urgent or emergent issues, a gastroenterologist can be reached at any hour by calling [phone]  [phone]. Do not use MyChart messaging for urgent concerns.    DIET:  We do recommend a small meal at first, but then you may proceed to your regular diet.  Drink plenty of fluids but you should avoid alcoholic beverages for 24 hours.  ACTIVITY:  You should plan to take it easy for the rest of today and you should NOT DRIVE or use heavy machinery until tomorrow (because of the sedation medicines used during the test).    FOLLOW UP: Our staff will call the number listed on your records 48-72 hours following your procedure to check on you and address any questions or concerns that you may have regarding the information given to you following your procedure. If we do not reach you, we will leave a message.  We will attempt to reach you two times.  During this call, we will ask if you have developed any symptoms of COVID 19. If you develop any symptoms (ie: fever, flu-like symptoms, shortness of breath, cough etc.) before then, please call [phone].  If you test positive for Covid 19 in the 2 weeks post procedure, please call and report this information to [place].    If any biopsies were taken you will be contacted by phone or by letter within the next 1-3 weeks.  Please call us at [phone] if you have not heard about the biopsies in 3 weeks.    SIGNATURES/CONFIDENTIALITY: You and/or your care partner have signed paperwork which will be entered into your electronic medical record.  These  signatures attest to the fact that that the information above on your After Visit Summary has been reviewed and is understood.  Full responsibility of the confidentiality of this discharge information lies with you and/or your care-partner.

## 2021-08-23 NOTE — Progress Notes (Signed)
[facility] History and Physical   Primary Care Physician:  Maximiano Coss, NP   Reason for Procedure:   History of colon polyps, dysphagia, GERD  Plan:    EGD and colonoscopy     HPI: Jacob Foster is a 56 y.o. male  here for colonoscopy surveillance - advanced adenoma removed 07/2018. Having dysphagia as well as GERD. Omeprazole helped GERD but dysphagia persists, EGD to evaluate that. Strong FH of colon cancer. Otherwise feels well without any cardiopulmonary symptoms. We discussed risks / benefits he wishes to proceed.   Past Medical History:  Diagnosis Date   Abscess of axilla    Arthritis    Depression    seasonal affective D/O    Family history of colon cancer 12/08/2018   Father and brother. Sees Dr. Enis Gash   GERD (gastroesophageal reflux disease)    Hidradenitis suppurativa    History of chickenpox    HTN (hypertension)    Hyperlipemia    Medical history non-contributory    Neck pain    Serrated polyp of colon 12/08/2018   Sleep apnea     Past Surgical History:  Procedure Laterality Date   ABSCESS DRAINAGE     ELBOW SURGERY      Prior to Admission medications   Medication Sig Start Date End Date Taking? Authorizing Provider  carbidopa-levodopa (SINEMET IR) 25-100 MG tablet Take 1 tablet by mouth 3 (three) times daily. 5am/10am/3pm Patient taking differently: Take 1 tablet by mouth 3 (three) times daily. 0700, 1200, 1700 09/20/20  Yes Tat, Rebecca S, DO  carvedilol (COREG) 6.25 MG tablet Take 1 tablet (6.25 mg total) by mouth 2 (two) times daily. 09/11/20  Yes Midge Minium, MD  hydrochlorothiazide (HYDRODIURIL) 25 MG tablet TAKE 1 TABLET(25 MG) BY MOUTH DAILY 05/26/21  Yes Maximiano Coss, NP  omeprazole (PRILOSEC) 20 MG capsule TAKE 1 CAPSULE BY MOUTH EVERY DAY 08/09/21  Yes Noralyn Pick, NP  rosuvastatin (CRESTOR) 10 MG tablet Take 1 tablet (10 mg total) by mouth daily. 02/26/21 07/17/21  Donato Heinz, MD    Current  Outpatient Medications  Medication Sig Dispense Refill   carbidopa-levodopa (SINEMET IR) 25-100 MG tablet Take 1 tablet by mouth 3 (three) times daily. 5am/10am/3pm (Patient taking differently: Take 1 tablet by mouth 3 (three) times daily. 0700, 1200, 1700) 270 tablet 1   carvedilol (COREG) 6.25 MG tablet Take 1 tablet (6.25 mg total) by mouth 2 (two) times daily. 180 tablet 0   hydrochlorothiazide (HYDRODIURIL) 25 MG tablet TAKE 1 TABLET(25 MG) BY MOUTH DAILY 90 tablet 0   omeprazole (PRILOSEC) 20 MG capsule TAKE 1 CAPSULE BY MOUTH EVERY DAY 30 capsule 1   rosuvastatin (CRESTOR) 10 MG tablet Take 1 tablet (10 mg total) by mouth daily. 90 tablet 3   Current Facility-Administered Medications  Medication Dose Route Frequency Provider Last Rate Last Admin   0.9 %  sodium chloride infusion  500 mL Intravenous Once Lewis Keats, Carlota Raspberry, MD        Allergies as of 08/23/2021   (No Known Allergies)    Family History  Problem Relation Age of Onset   Hypertension Mother    Hyperlipidemia Mother    Breast cancer Mother    Prostate cancer Father    Kidney disease Father    Colon cancer Father        in his 42's   Throat cancer Brother        dies age 73 - alcoholic    Hypertension Brother  Esophageal cancer Brother    Healthy Daughter    Healthy Son    Healthy Son    Healthy Son    Diabetes Brother    Colon cancer Brother 16   Colon cancer Brother 27   Colon cancer Maternal Uncle    Colon cancer Maternal Uncle    Colon polyps Neg Hx    Stomach cancer Neg Hx    Rectal cancer Neg Hx     Social History   Socioeconomic History   Marital status: Married    Spouse name: Secretary/administrator   Number of children: 4   Years of education: Not on file   Highest education level: Not on file  Occupational History   Occupation: Magazine features editor: [employer]  Tobacco Use   Smoking status: Never   Smokeless tobacco: Never  Vaping Use   Vaping Use: Never used  Substance and Sexual  Activity   Alcohol use: No   Drug use: No   Sexual activity: Yes  Other Topics Concern   Not on file  Social History Narrative   Originally from [place] Determinants of Health   Financial Resource Strain: Not on file  Food Insecurity: Not on file  Transportation Needs: Not on file  Physical Activity: Not on file  Stress: Not on file  Social Connections: Not on file  Intimate Partner Violence: Not on file    Review of Systems: All other review of systems negative except as mentioned in the HPI.  Physical Exam: Vital signs BP 105/67    Pulse 75    Temp (!) 97.3 F (36.3 C) (Temporal)    Ht 5\' 7"  (1.702 m)    Wt 195 lb (88.5 kg)    SpO2 98%    BMI 30.54 kg/m   General:   Alert,  Well-developed, pleasant and cooperative in NAD Lungs:  Clear throughout to auscultation.   Heart:  Regular rate and rhythm Abdomen:  Soft, nontender and nondistended.   Neuro/Psych:  Alert and cooperative. Normal mood and affect. A and O x 3  Jolly Mango, MD [facility]

## 2021-08-23 NOTE — Progress Notes (Signed)
Pt's states no medical or surgical changes since previsit or office visit.  ° °VS DT °

## 2021-08-23 NOTE — Progress Notes (Signed)
Pt awake, report to RN, VVS  °

## 2021-08-23 NOTE — Op Note (Signed)
[facility] Patient Name: Jacob Foster Procedure Date: 08/23/2021 2:43 PM MRN: [id number] Endoscopist: Remo Lipps P. Havery Moros , MD Age: 56 Referring MD:  Date of Birth: 1966-05-04 Gender: Male Account #: [id number] Procedure:                Colonoscopy Indications:              High risk colon cancer surveillance: Personal                            history of colonic polyps - 8mm adenoma removed                            07/2018, father and 2 brothers with CRC dx age 59-60s Medicines:                Monitored Anesthesia Care Procedure:                Pre-Anesthesia Assessment:                           - Prior to the procedure, a History and Physical                            was performed, and patient medications and                            allergies were reviewed. The patient's tolerance of                            previous anesthesia was also reviewed. The risks                            and benefits of the procedure and the sedation                            options and risks were discussed with the patient.                            All questions were answered, and informed consent                            was obtained. Prior Anticoagulants: The patient has                            taken no previous anticoagulant or antiplatelet                            agents. ASA Grade Assessment: III - A patient with                            severe systemic disease. After reviewing the risks                            and benefits, the patient was deemed in  satisfactory condition to undergo the procedure.                           After obtaining informed consent, the colonoscope                            was passed under direct vision. Throughout the                            procedure, the patient's blood pressure, pulse, and                            oxygen saturations were monitored continuously. The                            Olympus  [id number]-HQ190L [id number]) Colonoscope was                            introduced through the anus and advanced to the the                            cecum, identified by appendiceal orifice and                            ileocecal valve. The colonoscopy was performed                            without difficulty. The patient tolerated the                            procedure well. The quality of the bowel                            preparation was adequate. The ileocecal valve,                            appendiceal orifice, and rectum were photographed. Scope In: 3:14:00 PM Scope Out: 3:41:58 PM Scope Withdrawal Time: 0 hours 24 minutes 10 seconds  Total Procedure Duration: 0 hours 27 minutes 58 seconds  Findings:                 The perianal and digital rectal examinations were                            normal.                           A diminutive polyp was found in the ascending                            colon. The polyp was flat. The polyp was removed                            with a cold biopsy forceps. Resection and retrieval  were complete.                           A diminutive polyp was found in the splenic                            flexure. The polyp was sessile. The polyp was                            removed with a cold biopsy forceps. Resection and                            retrieval were complete.                           A diminutive polyp was found in the descending                            colon. The polyp was sessile. The polyp was removed                            with a cold biopsy forceps. Resection and retrieval                            were complete.                           An area of altered mucosa was found in the distal                            rectum / dentate line - concerning for possible                            AIN. Biopsies were taken with a cold forceps for                            histology.                            Internal hemorrhoids were found during                            retroflexion. The hemorrhoids were small.                           The colon was tortous. The exam was otherwise                            without abnormality. Complications:            No immediate complications. Estimated blood loss:                            Minimal. Estimated Blood Loss:     Estimated blood loss was minimal. Impression:               -  One diminutive polyp in the ascending colon,                            removed with a cold biopsy forceps. Resected and                            retrieved.                           - One diminutive polyp at the splenic flexure,                            removed with a cold biopsy forceps. Resected and                            retrieved.                           - One diminutive polyp in the descending colon,                            removed with a cold biopsy forceps. Resected and                            retrieved.                           - Altered mucosa at the dentate line, concerning                            for possible AIN. Biopsied.                           - Internal hemorrhoids.                           - Tortous colon                           - The examination was otherwise normal. Recommendation:           - Patient has a contact number available for                            emergencies. The signs and symptoms of potential                            delayed complications were discussed with the                            patient. Return to normal activities tomorrow.                            Written discharge instructions were provided to the                            patient.                           -  Resume previous diet.                           - Continue present medications.                           - Await pathology results. Remo Lipps P. Havery Moros, MD 08/23/2021 3:48:59 PM This report has been signed electronically.

## 2021-08-23 NOTE — Op Note (Signed)
[place] Patient Name: Jacob Foster Procedure Date: 08/23/2021 2:51 PM MRN: [id number] Endoscopist: Remo Lipps P. Havery Moros , MD Age: 56 Referring MD:  Date of Birth: 1966-04-08 Gender: Male Account #: [id number] Procedure:                Upper GI endoscopy Indications:              Dysphagia, Follow-up of gastro-esophageal reflux                            disease - better controlled with omeprazole but                            dysphagia persists Medicines:                Monitored Anesthesia Care Procedure:                Pre-Anesthesia Assessment:                           - Prior to the procedure, a History and Physical                            was performed, and patient medications and                            allergies were reviewed. The patient's tolerance of                            previous anesthesia was also reviewed. The risks                            and benefits of the procedure and the sedation                            options and risks were discussed with the patient.                            All questions were answered, and informed consent                            was obtained. Prior Anticoagulants: The patient has                            taken no previous anticoagulant or antiplatelet                            agents. ASA Grade Assessment: III - A patient with                            severe systemic disease. After reviewing the risks                            and benefits, the patient was deemed in  satisfactory condition to undergo the procedure.                           After obtaining informed consent, the endoscope was                            passed under direct vision. Throughout the                            procedure, the patient's blood pressure, pulse, and                            oxygen saturations were monitored continuously. The                            GIF [id number] #[id number] was introduced  through the                            mouth, and advanced to the second part of duodenum.                            The upper GI endoscopy was accomplished without                            difficulty. The patient tolerated the procedure                            well. Scope In: Scope Out: Findings:                 Esophagogastric landmarks were identified: the                            Z-line was found at 41 cm, the gastroesophageal                            junction was found at 41 cm and the upper extent of                            the gastric folds was found at 41 cm from the                            incisors.                           The exam of the esophagus was otherwise normal. No                            inflammatory changes                           A guidewire was placed and the scope was withdrawn.                            Empiric dilation was performed in  the entire                            esophagus with a Savary dilator with mild                            resistance at 17 mm. Relook endoscopy showed no                            mucosal wrents.                           The entire examined stomach was normal.                           The duodenal bulb and second portion of the                            duodenum were normal. Complications:            No immediate complications. Estimated blood loss:                            Minimal. Estimated Blood Loss:     Estimated blood loss was minimal. Impression:               - Esophagogastric landmarks identified.                           - Normal esophagus otherwise - empiric dilation                            performed to 44mm                           - Normal stomach.                           - Normal duodenal bulb and second portion of the                            duodenum. Recommendation:           - Patient has a contact number available for                            emergencies. The signs and  symptoms of potential                            delayed complications were discussed with the                            patient. Return to normal activities tomorrow.                            Written discharge instructions were provided to the  patient.                           - Resume previous diet.                           - Continue present medications.                           - Await course post dilation. If symptoms persist                            please contact me for further assessment Remo Lipps P. Havery Moros, MD 08/23/2021 3:52:21 PM This report has been signed electronically.

## 2021-08-23 NOTE — Progress Notes (Signed)
Called to room to assist during endoscopic procedure.  Patient ID and intended procedure confirmed with present staff. Received instructions for my participation in the procedure from the performing physician.  

## 2021-08-27 ENCOUNTER — Telehealth: Payer: Self-pay | Admitting: *Deleted

## 2021-08-27 ENCOUNTER — Telehealth: Payer: Self-pay

## 2021-08-27 NOTE — Telephone Encounter (Signed)
First attempt, left VM.  

## 2021-08-27 NOTE — Telephone Encounter (Signed)
Second attempt follow up call to pt, LM on vm.

## 2021-09-05 NOTE — Progress Notes (Signed)
Cardiology Office Note:    Date:  09/08/2021   ID:  [name], DOB May 16, 1966, MRN [id number]  PCP:  Maximiano Coss, NP  Cardiologist:  None  Electrophysiologist:  None   Referring MD: Maximiano Coss, NP   Chief Complaint  Patient presents with   Chest Pain    History of Present Illness:    Jacob Foster is a 56 y.o. male with a hx of Parkinson's, hypertension, hyperlipidemia, and depression who presents for follow-up.  He was referred by Maximiano Coss, NP for evaluation of chest pain, initially seen on 02/26/2021.  He was seen in the ED at [facility] for chest pain on 12/18/2020.  Troponins negative.  Reports he was walking out of doctor's office and started having chest pain.  Describes as sharp left-sided chest pain that occurred while he walked.  Lasted for 7 to 8 minutes.  He denies any chest pain since that time.  He walks for 30 to 45 minutes every morning.  Denies chest pain with this but does report gets short of breath, particular with walking up hills.  Has also been having issues with feeling like food gets stuck when he swallows.  Has significant coughing spells when he eats, which can lead to lightheadedness.  He denies any syncope.  Denies any lower extremity edema or palpitations.  No smoking history.  No known family history of heart disease.  Coronary CTA on 03/05/2021 showed normal coronary arteries, calcium score 0.  Also noted to have small lung nodule.  Echocardiogram 03/15/2021 showed normal biventricular function, no significant valvular disease.  Since last clinic visit, he reports that he is doing well.  Denies any chest pain, dyspnea, lightheadedness, syncope, lower extremity edema, or palpitations.  Reports BP 130/80s.  Past Medical History:  Diagnosis Date   Abscess of axilla    Arthritis    Depression    seasonal affective D/O    Family history of colon cancer 12/08/2018   Father and brother. Sees Dr. Enis Gash   GERD (gastroesophageal reflux disease)     Hidradenitis suppurativa    History of chickenpox    HTN (hypertension)    Hyperlipemia    Medical history non-contributory    Neck pain    Serrated polyp of colon 12/08/2018   Sleep apnea     Past Surgical History:  Procedure Laterality Date   ABSCESS DRAINAGE     ELBOW SURGERY      Current Medications: Current Meds  Medication Sig   carbidopa-levodopa (SINEMET IR) 25-100 MG tablet Take 1 tablet by mouth 3 (three) times daily. 5am/10am/3pm (Patient taking differently: Take 1 tablet by mouth 3 (three) times daily. 0700, 1200, 1700)   carvedilol (COREG) 6.25 MG tablet Take 1 tablet (6.25 mg total) by mouth 2 (two) times daily.   hydrochlorothiazide (HYDRODIURIL) 25 MG tablet TAKE 1 TABLET(25 MG) BY MOUTH DAILY   [DISCONTINUED] omeprazole (PRILOSEC) 20 MG capsule TAKE 1 CAPSULE BY MOUTH EVERY DAY     Allergies:   Patient has no known allergies.   Social History   Socioeconomic History   Marital status: Married    Spouse name: Karma   Number of children: 4   Years of education: Not on file   Highest education level: Not on file  Occupational History   Occupation: Magazine features editor: [employer]  Tobacco Use   Smoking status: Never   Smokeless tobacco: Never  Vaping Use   Vaping Use: Never used  Substance and  Sexual Activity   Alcohol use: No   Drug use: No   Sexual activity: Yes  Other Topics Concern   Not on file  Social History Narrative   Originally from [place] Determinants of Health   Financial Resource Strain: Not on file  Food Insecurity: Not on file  Transportation Needs: Not on file  Physical Activity: Not on file  Stress: Not on file  Social Connections: Not on file     Family History: The patient's family history includes Breast cancer in his mother; Colon cancer in his father, maternal uncle, and maternal uncle; Colon cancer (age of onset: 15) in his brother; Colon cancer (age of onset: 53) in his brother; Diabetes in his  brother; Esophageal cancer in his brother; Healthy in his daughter, son, son, and son; Hyperlipidemia in his mother; Hypertension in his brother and mother; Kidney disease in his father; Prostate cancer in his father; Throat cancer in his brother. There is no history of Colon polyps, Stomach cancer, or Rectal cancer.  ROS:   Please see the history of present illness.     All other systems reviewed and are negative.  EKGs/Labs/Other Studies Reviewed:    The following studies were reviewed today:   EKG:   09/06/21: NSR, rate 69, nonspeciifc T wave flattening EKG is  ordered today.  The ekg ordered today demonstrates sinus rhythm, rate 59, nonspecific T wave flattening  Recent Labs: 12/25/2020: ALT 9; Hemoglobin 17.4; Platelets 203.0; Pro B Natriuretic peptide (BNP) 12.0; TSH 0.72 09/06/2021: BUN 11; Creatinine, Ser 1.03; Magnesium 2.0; Potassium 4.2; Sodium 141  Recent Lipid Panel    Component Value Date/Time   CHOL 239 (H) 09/06/2021 1033   TRIG 269 (H) 09/06/2021 1033   HDL 36 (L) 09/06/2021 1033   CHOLHDL 6.6 (H) 09/06/2021 1033   CHOLHDL 6 12/25/2020 1601   VLDL 28.2 12/25/2020 1601   LDLCALC 153 (H) 09/06/2021 1033   LDLCALC 156 (H) 11/27/2017 1630    Physical Exam:    VS:  BP 132/90 (BP Location: Left Arm, Patient Position: Sitting)    Pulse 69    Ht 5\' 8"  (1.727 m)    Wt 191 lb 9.6 oz (86.9 kg)    SpO2 97%    BMI 29.13 kg/m     Wt Readings from Last 3 Encounters:  09/06/21 191 lb 9.6 oz (86.9 kg)  08/23/21 195 lb (88.5 kg)  07/17/21 195 lb (88.5 kg)     GEN:  Well nourished, well developed in no acute distress HEENT: Normal NECK: No JVD; No carotid bruits LYMPHATICS: No lymphadenopathy CARDIAC: RRR, no murmurs, rubs, gallops RESPIRATORY:  Clear to auscultation without rales, wheezing or rhonchi  ABDOMEN: Soft, non-tender, non-distended MUSCULOSKELETAL:  No edema; No deformity  SKIN: Warm and dry NEUROLOGIC:  Alert and oriented x 3 PSYCHIATRIC:  Normal affect    ASSESSMENT:    1. Chest pain of uncertain etiology   2. Essential hypertension   3. Hyperlipidemia, unspecified hyperlipidemia type   4. Dysphagia, unspecified type   5. Lung nodule     PLAN:    Chest pain: Atypical in description.  Coronary CTA on 03/05/2021 showed normal coronary arteries, calcium score 0.  Echocardiogram 03/15/2021 showed normal biventricular function, no significant valvular disease. -No further cardiac work-up recommended  Dysphagia: Referred to GI, underwent EGD and had esophageal dilation, reports significant improvement.   Hypertension: On hydrochlorothiazide 25 mg daily, carvedilol 6.25 mg twice daily.  Appears controlled.  Check BMET, magnesium  Hyperlipidemia: LDL 140 on 12/25/2020.  11% 10-year ASCVD risk score.  Started rosuvastatin 10 mg daily, will check lipid panel  Pulmonary nodule: Small pulmonary nodule noted on coronary CTA 03/05/2021.  Follow-up CT chest in 1 year  RTC in 1 year  Medication Adjustments/Labs and Tests Ordered: Current medicines are reviewed at length with the patient today.  Concerns regarding medicines are outlined above.  Orders Placed This Encounter  Procedures   Lipid panel   Basic metabolic panel   Magnesium   EKG 12-Lead   No orders of the defined types were placed in this encounter.   Patient Instructions  Medication Instructions:  The current medical regimen is effective;  continue present plan and medications.  *If you need a refill on your cardiac medications before your next appointment, please call your pharmacy*   Lab Work: LIPID, BMET, MAG today   If you have labs (blood work) drawn today and your tests are completely normal, you will receive your results only by: [place] (if you have MyChart) OR A paper copy in the mail If you have any lab test that is abnormal or we need to change your treatment, we will call you to review the results.   Follow-Up: At [facility], you and your health  needs are our priority.  As part of our continuing mission to provide you with exceptional heart care, we have created designated Provider Care Teams.  These Care Teams include your primary Cardiologist (physician) and Advanced Practice Providers (APPs -  Physician Assistants and Nurse Practitioners) who all work together to provide you with the care you need, when you need it.  We recommend signing up for the patient portal called "MyChart".  Sign up information is provided on this After Visit Summary.  MyChart is used to connect with patients for Virtual Visits (Telemedicine).  Patients are able to view lab/test results, encounter notes, upcoming appointments, etc.  Non-urgent messages can be sent to your provider as well.   To learn more about what you can do with MyChart, go to [email or web address].    Your next appointment:   12 month(s)  The format for your next appointment:   In Person  Provider:   Oswaldo Milian, MD      Signed, Donato Heinz, MD  09/08/2021 10:44 PM    [place]

## 2021-09-06 ENCOUNTER — Ambulatory Visit (INDEPENDENT_AMBULATORY_CARE_PROVIDER_SITE_OTHER): Payer: Commercial Managed Care - PPO | Admitting: Cardiology

## 2021-09-06 ENCOUNTER — Other Ambulatory Visit: Payer: Self-pay

## 2021-09-06 ENCOUNTER — Encounter: Payer: Self-pay | Admitting: Cardiology

## 2021-09-06 ENCOUNTER — Other Ambulatory Visit: Payer: Self-pay | Admitting: Nurse Practitioner

## 2021-09-06 VITALS — BP 132/90 | HR 69 | Ht 68.0 in | Wt 191.6 lb

## 2021-09-06 DIAGNOSIS — E785 Hyperlipidemia, unspecified: Secondary | ICD-10-CM

## 2021-09-06 DIAGNOSIS — R079 Chest pain, unspecified: Secondary | ICD-10-CM

## 2021-09-06 DIAGNOSIS — R1310 Dysphagia, unspecified: Secondary | ICD-10-CM | POA: Diagnosis not present

## 2021-09-06 DIAGNOSIS — I10 Essential (primary) hypertension: Secondary | ICD-10-CM | POA: Diagnosis not present

## 2021-09-06 DIAGNOSIS — R911 Solitary pulmonary nodule: Secondary | ICD-10-CM

## 2021-09-06 LAB — BASIC METABOLIC PANEL
BUN/Creatinine Ratio: 11 (ref 9–20)
BUN: 11 mg/dL (ref 6–24)
CO2: 26 mmol/L (ref 20–29)
Calcium: 9.8 mg/dL (ref 8.7–10.2)
Chloride: 103 mmol/L (ref 96–106)
Creatinine, Ser: 1.03 mg/dL (ref 0.76–1.27)
Glucose: 118 mg/dL — ABNORMAL HIGH (ref 70–99)
Potassium: 4.2 mmol/L (ref 3.5–5.2)
Sodium: 141 mmol/L (ref 134–144)
eGFR: 87 mL/min/{1.73_m2} (ref >59)

## 2021-09-06 LAB — LIPID PANEL
Chol/HDL Ratio: 6.6 ratio — ABNORMAL HIGH (ref 0.0–5.0)
Cholesterol, Total: 239 mg/dL — ABNORMAL HIGH (ref 100–199)
HDL: 36 mg/dL — ABNORMAL LOW (ref >39)
LDL Chol Calc (NIH): 153 mg/dL — ABNORMAL HIGH (ref 0–99)
Triglycerides: 269 mg/dL — ABNORMAL HIGH (ref 0–149)
VLDL Cholesterol Cal: 50 mg/dL — ABNORMAL HIGH (ref 5–40)

## 2021-09-06 LAB — MAGNESIUM: Magnesium: 2 mg/dL (ref 1.6–2.3)

## 2021-09-06 NOTE — Patient Instructions (Signed)
Medication Instructions:  The current medical regimen is effective;  continue present plan and medications.  *If you need a refill on your cardiac medications before your next appointment, please call your pharmacy*   Lab Work: LIPID, BMET, MAG today   If you have labs (blood work) drawn today and your tests are completely normal, you will receive your results only by: [place] (if you have MyChart) OR A paper copy in the mail If you have any lab test that is abnormal or we need to change your treatment, we will call you to review the results.   Follow-Up: At [facility], you and your health needs are our priority.  As part of our continuing mission to provide you with exceptional heart care, we have created designated Provider Care Teams.  These Care Teams include your primary Cardiologist (physician) and Advanced Practice Providers (APPs -  Physician Assistants and Nurse Practitioners) who all work together to provide you with the care you need, when you need it.  We recommend signing up for the patient portal called "MyChart".  Sign up information is provided on this After Visit Summary.  MyChart is used to connect with patients for Virtual Visits (Telemedicine).  Patients are able to view lab/test results, encounter notes, upcoming appointments, etc.  Non-urgent messages can be sent to your provider as well.   To learn more about what you can do with MyChart, go to [email or web address].    Your next appointment:   12 month(s)  The format for your next appointment:   In Person  Provider:   Oswaldo Milian, MD

## 2021-09-09 ENCOUNTER — Encounter: Payer: Self-pay | Admitting: Gastroenterology

## 2021-09-09 NOTE — Progress Notes (Signed)
Given calcium score is 0, can hold off on being more aggressive with cholesterol at this time.  Can continue rosuvastatin and work on diet/exercise.

## 2021-09-27 ENCOUNTER — Other Ambulatory Visit: Payer: Self-pay | Admitting: Registered Nurse

## 2021-09-27 DIAGNOSIS — I10 Essential (primary) hypertension: Secondary | ICD-10-CM

## 2021-10-01 ENCOUNTER — Other Ambulatory Visit: Payer: Self-pay | Admitting: Nurse Practitioner

## 2021-10-21 ENCOUNTER — Encounter: Payer: Self-pay | Admitting: Registered Nurse

## 2021-10-22 NOTE — Telephone Encounter (Signed)
I have printed these forms and have placed in the back to be completed. ?

## 2021-11-02 NOTE — Telephone Encounter (Signed)
I have returned these to you ? ?Thanks, ? ?Rich

## 2021-11-07 ENCOUNTER — Encounter: Payer: Self-pay | Admitting: Family Medicine

## 2021-11-07 ENCOUNTER — Other Ambulatory Visit: Payer: Self-pay

## 2021-11-07 ENCOUNTER — Ambulatory Visit: Payer: Commercial Managed Care - PPO | Admitting: Family Medicine

## 2021-11-07 VITALS — BP 118/78 | HR 88 | Temp 97.6°F | Resp 18 | Ht 68.0 in | Wt 190.8 lb

## 2021-11-07 DIAGNOSIS — R051 Acute cough: Secondary | ICD-10-CM | POA: Diagnosis not present

## 2021-11-07 DIAGNOSIS — J069 Acute upper respiratory infection, unspecified: Secondary | ICD-10-CM

## 2021-11-07 DIAGNOSIS — H04203 Unspecified epiphora, bilateral lacrimal glands: Secondary | ICD-10-CM | POA: Diagnosis not present

## 2021-11-07 LAB — POC COVID19 BINAXNOW: SARS Coronavirus 2 Ag: NEGATIVE

## 2021-11-07 MED ORDER — BENZONATATE 100 MG PO CAPS: 100.0000 mg | ORAL_CAPSULE | Freq: Three times a day (TID) | ORAL | 0 refills | Status: DC | PRN

## 2021-11-07 NOTE — Patient Instructions (Addendum)
? ?Flonase for allergies - over the counter.  ?Mucinex or tessalon if needed for cough.  ?Let me know if stronger med needed for cough at night, but those can cause other side effects with your medical history.  ?If discharge in eyes during the day, then call and I will send in eye drops.  ?Drink plenty of fluids, rest. Return to the clinic or go to the nearest emergency room if any of your symptoms worsen or new symptoms occur. ?Get better soon! ? ?Upper Respiratory Infection, Adult ?An upper respiratory infection (URI) is a common viral infection of the nose, throat, and upper air passages that lead to the lungs. The most common type of URI is the common cold. URIs usually get better on their own, without medical treatment. ?What are the causes? ?A URI is caused by a virus. You may catch a virus by: ?Breathing in droplets from an infected person's cough or sneeze. ?Touching something that has been exposed to the virus (is contaminated) and then touching your mouth, nose, or eyes. ?What increases the risk? ?You are more likely to get a URI if: ?You are very young or very old. ?You have close contact with others, such as at work, school, or a health care facility. ?You smoke. ?You have long-term (chronic) heart or lung disease. ?You have a weakened disease-fighting system (immune system). ?You have nasal allergies or asthma. ?You are experiencing a lot of stress. ?You have poor nutrition. ?What are the signs or symptoms? ?A URI usually involves some of the following symptoms: ?Runny or stuffy (congested) nose. ?Cough. ?Sneezing. ?Sore throat. ?Headache. ?Fatigue. ?Fever. ?Loss of appetite. ?Pain in your forehead, behind your eyes, and over your cheekbones (sinus pain). ?Muscle aches. ?Redness or irritation of the eyes. ?Pressure in the ears or face. ?How is this diagnosed? ?This condition may be diagnosed based on your medical history and symptoms, and a physical exam. Your health care provider may use a swab to  take a mucus sample from your nose (nasal swab). This sample can be tested to determine what virus is causing the illness. ?How is this treated? ?URIs usually get better on their own within 7-10 days. Medicines cannot cure URIs, but your health care provider may recommend certain medicines to help relieve symptoms, such as: ?Over-the-counter cold medicines. ?Cough suppressants. Coughing is a type of defense against infection that helps to clear the respiratory system, so take these medicines only as recommended by your health care provider. ?Fever-reducing medicines. ?Follow these instructions at home: ?Activity ?Rest as needed. ?If you have a fever, stay home from work or school until your fever is gone or until your health care provider says your URI cannot spread to other people (is no longer contagious). Your health care provider may have you wear a face mask to prevent your infection from spreading. ?Relieving symptoms ?Gargle with a mixture of salt and water 3-4 times a day or as needed. To make salt water, completely dissolve ?-1 tsp (3-6 g) of salt in 1 cup (237 mL) of warm water. ?Use a cool-mist humidifier to add moisture to the air. This can help you breathe more easily. ?Eating and drinking ? ?Drink enough fluid to keep your urine pale yellow. ?Eat soups and other clear broths. ?General instructions ? ?Take over-the-counter and prescription medicines only as told by your health care provider. These include cold medicines, fever reducers, and cough suppressants. ?Do not use any products that contain nicotine or tobacco. These products include cigarettes, chewing  tobacco, and vaping devices, such as e-cigarettes. If you need help quitting, ask your health care provider. ?Stay away from secondhand smoke. ?Stay up to date on all immunizations, including the yearly (annual) flu vaccine. ?Keep all follow-up visits. This is important. ?How to prevent the spread of infection to others ?URIs can be contagious. To  prevent the infection from spreading: ?Wash your hands with soap and water for at least 20 seconds. If soap and water are not available, use hand sanitizer. ?Avoid touching your mouth, face, eyes, or nose. ?Cough or sneeze into a tissue or your sleeve or elbow instead of into your hand or into the air. ? ?Contact a health care provider if: ?You are getting worse instead of better. ?You have a fever or chills. ?Your mucus is brown or red. ?You have yellow or brown discharge coming from your nose. ?You have pain in your face, especially when you bend forward. ?You have swollen neck glands. ?You have pain while swallowing. ?You have white areas in the back of your throat. ?Get help right away if: ?You have shortness of breath that gets worse. ?You have severe or persistent: ?Headache. ?Ear pain. ?Sinus pain. ?Chest pain. ?You have chronic lung disease along with any of the following: ?Making high-pitched whistling sounds when you breathe, most often when you breathe out (wheezing). ?Prolonged cough (more than 14 days). ?Coughing up blood. ?A change in your usual mucus. ?You have a stiff neck. ?You have changes in your: ?Vision. ?Hearing. ?Thinking. ?Mood. ?These symptoms may be an emergency. Get help right away. Call 911. ?Do not wait to see if the symptoms will go away. ?Do not drive yourself to the hospital. ?Summary ?An upper respiratory infection (URI) is a common infection of the nose, throat, and upper air passages that lead to the lungs. ?A URI is caused by a virus. ?URIs usually get better on their own within 7-10 days. ?Medicines cannot cure URIs, but your health care provider may recommend certain medicines to help relieve symptoms. ?This information is not intended to replace advice given to you by your health care provider. Make sure you discuss any questions you have with your health care provider. ?Document Revised: 01/30/2021 Document Reviewed: 01/30/2021 ?Elsevier Patient Education ? [place]. ? ? ? ? ? ?If you have lab work done today you will be contacted with your lab results within the next 2 weeks.  If you have not heard from [place] then please contact us. The fastest way to get your results is to register for My Chart. ? ? ?IF you received an x-ray today, you will receive an invoice from [facility]. Please contact [facility] at [phone] with questions or concerns regarding your invoice.  ? ?IF you received labwork today, you will receive an invoice from [facility]. Please contact [facility] at [phone] with questions or concerns regarding your invoice.  ? ?Our billing staff will not be able to assist you with questions regarding bills from these companies. ? ?You will be contacted with the lab results as soon as they are available. The fastest way to get your results is to activate your My Chart account. Instructions are located on the last page of this paperwork. If you have not heard from [place] regarding the results in 2 weeks, please contact this office. ?  ? ? ?

## 2021-11-07 NOTE — Progress Notes (Signed)
? ?Subjective:  ?Patient ID: Jacob Foster, male    DOB: May 26, 1966  Age: 56 y.o. MRN: [id number] ? ?CC:  ?Chief Complaint  ?Patient presents with  ? Nasal Congestion  ?  Patient states he has been experiencing coughing at night , coughing up phlegm , congestion and pus on the corners of both eyes.  ? ? ?HPI ?Jacob Foster presents for  ? ?Cough: ?Started past few days. Productive yellow green phlegm. No fever or dyspnea. ?Eyes with crusting in the morning, mucus in corners of eyes in morning only.  No vision changes.  ?No fever.  ?Cough at night. Cough fits at times. No vomiting.  ?No covid test.  ?No known sick contacts.  ?Eating and drinking ok.  ?Hx of seasonal allergies - no current meds.  ? ?Tx: otc cough med - helped cough some.  ? ? ? ?History ?Patient Active Problem List  ? Diagnosis Date Noted  ? Adjustment disorder with depressed mood 10/25/2020  ? Parkinson's disease ([place]) 09/20/2020  ? Essential hypertension 12/16/2019  ? Lateral epicondylitis of right elbow 10/31/2019  ? COVID-19 virus infection 03/15/2019  ? Family history of colon cancer 12/08/2018  ? Serrated polyp of colon 12/08/2018  ? Gastroesophageal reflux disease without esophagitis 08/20/2018  ? GAD (generalized anxiety disorder) 04/23/2018  ? Seasonal affective disorder ([place]) 04/23/2018  ? Overweight (BMI 25.0-29.9) 09/05/2017  ? ?Past Medical History:  ?Diagnosis Date  ? Abscess of axilla   ? Arthritis   ? Depression   ? seasonal affective D/O   ? Family history of colon cancer 12/08/2018  ? Father and brother. Sees Dr. Enis Gash  ? GERD (gastroesophageal reflux disease)   ? Hidradenitis suppurativa   ? History of chickenpox   ? HTN (hypertension)   ? Hyperlipemia   ? Medical history non-contributory   ? Neck pain   ? Serrated polyp of colon 12/08/2018  ? Sleep apnea   ? ?Past Surgical History:  ?Procedure Laterality Date  ? ABSCESS DRAINAGE    ? ELBOW SURGERY    ? ?No Known Allergies ?Prior to Admission medications   ?Medication Sig Start Date  End Date Taking? Authorizing Provider  ?carbidopa-levodopa (SINEMET IR) 25-100 MG tablet Take 1 tablet by mouth 3 (three) times daily. 5am/10am/3pm ?Patient taking differently: Take 1 tablet by mouth 3 (three) times daily. 0700, 1200, 1700 09/20/20  Yes Tat, Eustace Quail, DO  ?carvedilol (COREG) 6.25 MG tablet Take 1 tablet (6.25 mg total) by mouth 2 (two) times daily. 09/11/20  Yes Midge Minium, MD  ?hydrochlorothiazide (HYDRODIURIL) 25 MG tablet TAKE 1 TABLET(25 MG) BY MOUTH DAILY 09/27/21  Yes Maximiano Coss, NP  ?omeprazole (PRILOSEC) 20 MG capsule TAKE 1 CAPSULE BY MOUTH EVERY DAY 10/01/21  Yes Noralyn Pick, NP  ?rosuvastatin (CRESTOR) 10 MG tablet Take 1 tablet (10 mg total) by mouth daily. 02/26/21 07/17/21  Donato Heinz, MD  ? ?Social History  ? ?Socioeconomic History  ? Marital status: Married  ?  Spouse name: Edsel Petrin  ? Number of children: 4  ? Years of education: Not on file  ? Highest education level: Not on file  ?Occupational History  ? Occupation: Psychologist, occupational  ?  Employer: [employer]  ?Tobacco Use  ? Smoking status: Never  ? Smokeless tobacco: Never  ?Vaping Use  ? Vaping Use: Never used  ?Substance and Sexual Activity  ? Alcohol use: No  ? Drug use: No  ? Sexual activity: Yes  ?Other Topics Concern  ?  Not on file  ?Social History Narrative  ? Originally from [facility]  ? ?Social Determinants of Health  ? ?Financial Resource Strain: Not on file  ?Food Insecurity: Not on file  ?Transportation Needs: Not on file  ?Physical Activity: Not on file  ?Stress: Not on file  ?Social Connections: Not on file  ?Intimate Partner Violence: Not on file  ? ? ?Review of Systems ? ?Per HPI ?Objective:  ? ?Vitals:  ? 11/07/21 1350  ?BP: 118/78  ?Pulse: 88  ?Resp: 18  ?Temp: 97.6 ?F (36.4 ?C)  ?TempSrc: Temporal  ?SpO2: 96%  ?Weight: 190 lb 12.8 oz (86.5 kg)  ?Height: '5\' 8"'$  (1.727 m)  ? ? ? ?Physical Exam ?Vitals reviewed.  ?Constitutional:   ?   Appearance: He is well-developed.  ?HENT:  ?   Head:  Normocephalic and atraumatic.  ?   Right Ear: Tympanic membrane, ear canal and external ear normal.  ?   Left Ear: Tympanic membrane, ear canal and external ear normal.  ?   Nose: No rhinorrhea.  ?   Mouth/Throat:  ?   Pharynx: No oropharyngeal exudate or posterior oropharyngeal erythema.  ?Eyes:  ?   Extraocular Movements: Extraocular movements intact.  ?   Conjunctiva/sclera: Conjunctivae normal.  ?   Pupils: Pupils are equal, round, and reactive to light.  ?   Comments: No scleral injection. Lids clear, no exudate at canthi.   ?Cardiovascular:  ?   Rate and Rhythm: Normal rate and regular rhythm.  ?   Heart sounds: Normal heart sounds. No murmur heard. ?Pulmonary:  ?   Effort: Pulmonary effort is normal.  ?   Breath sounds: Normal breath sounds. No wheezing, rhonchi or rales.  ?Abdominal:  ?   Palpations: Abdomen is soft.  ?   Tenderness: There is no abdominal tenderness.  ?Musculoskeletal:  ?   Cervical back: Neck supple.  ?Lymphadenopathy:  ?   Cervical: No cervical adenopathy.  ?Skin: ?   General: Skin is warm and dry.  ?   Findings: No rash.  ?Neurological:  ?   Mental Status: He is alert and oriented to person, place, and time.  ?Psychiatric:     ?   Behavior: Behavior normal.  ? ? ? ?Results for orders placed or performed in visit on 11/07/21  ?POC COVID-19 BinaxNow  ?Result Value Ref Range  ? SARS Coronavirus 2 Ag Negative Negative  ? ? ? ?Assessment & Plan:  ?Jacob Foster is a 56 y.o. male . ?Acute cough - Plan: benzonatate (TESSALON) 100 MG capsule, POC COVID-19 BinaxNow ? ?Upper respiratory tract infection, unspecified type ? ?Bilateral epiphora ?Suspected viral illness.  Tessalon Perles as needed for cough.  Consider eyedrops if persistent discharge from eyes or crusting.  Likely viral at this time. ?Additionally discussed Flonase for allergies.  Option of stronger cough medicine if needed at night but with underlying Parkinson's and other meds will avoid narcotic cough medicine for now.  Can also try  Mucinex in place of Tessalon.  Symptomatic care discussed with RTC precautions given. ?Meds ordered this encounter  ?Medications  ? benzonatate (TESSALON) 100 MG capsule  ?  Sig: Take 1 capsule (100 mg total) by mouth 3 (three) times daily as needed for cough.  ?  Dispense:  20 capsule  ?  Refill:  0  ? ?Patient Instructions  ? ? ?Flonase for allergies - over the counter.  ?Mucinex or tessalon if needed for cough.  ?Let me know if stronger med needed for cough  at night, but those can cause other side effects with your medical history.  ?If discharge in eyes during the day, then call and I will send in eye drops.  ?Drink plenty of fluids, rest. Return to the clinic or go to the nearest emergency room if any of your symptoms worsen or new symptoms occur. ?Get better soon! ? ?Upper Respiratory Infection, Adult ?An upper respiratory infection (URI) is a common viral infection of the nose, throat, and upper air passages that lead to the lungs. The most common type of URI is the common cold. URIs usually get better on their own, without medical treatment. ?What are the causes? ?A URI is caused by a virus. You may catch a virus by: ?Breathing in droplets from an infected person's cough or sneeze. ?Touching something that has been exposed to the virus (is contaminated) and then touching your mouth, nose, or eyes. ?What increases the risk? ?You are more likely to get a URI if: ?You are very young or very old. ?You have close contact with others, such as at work, school, or a health care facility. ?You smoke. ?You have long-term (chronic) heart or lung disease. ?You have a weakened disease-fighting system (immune system). ?You have nasal allergies or asthma. ?You are experiencing a lot of stress. ?You have poor nutrition. ?What are the signs or symptoms? ?A URI usually involves some of the following symptoms: ?Runny or stuffy (congested) nose. ?Cough. ?Sneezing. ?Sore throat. ?Headache. ?Fatigue. ?Fever. ?Loss of  appetite. ?Pain in your forehead, behind your eyes, and over your cheekbones (sinus pain). ?Muscle aches. ?Redness or irritation of the eyes. ?Pressure in the ears or face. ?How is this diagnosed? ?This condition may

## 2021-11-07 NOTE — Telephone Encounter (Signed)
Will give to patient in a few ? ?

## 2021-11-08 ENCOUNTER — Encounter: Payer: Self-pay | Admitting: Family Medicine

## 2022-01-21 ENCOUNTER — Other Ambulatory Visit: Payer: Self-pay | Admitting: *Deleted

## 2022-01-21 DIAGNOSIS — R911 Solitary pulmonary nodule: Secondary | ICD-10-CM

## 2022-01-25 ENCOUNTER — Other Ambulatory Visit: Payer: Self-pay | Admitting: Registered Nurse

## 2022-01-25 DIAGNOSIS — I10 Essential (primary) hypertension: Secondary | ICD-10-CM

## 2022-02-03 ENCOUNTER — Encounter: Payer: Self-pay | Admitting: Gastroenterology

## 2022-02-21 ENCOUNTER — Ambulatory Visit
Admission: RE | Admit: 2022-02-21 | Discharge: 2022-02-21 | Disposition: A | Discharge status: Home / Self Care | Payer: Commercial Managed Care - PPO | Source: Ambulatory Visit | Attending: Cardiology | Admitting: Cardiology

## 2022-02-21 DIAGNOSIS — R911 Solitary pulmonary nodule: Secondary | ICD-10-CM

## 2022-08-04 ENCOUNTER — Other Ambulatory Visit: Payer: Self-pay

## 2022-08-04 ENCOUNTER — Encounter (HOSPITAL_COMMUNITY): Payer: Self-pay

## 2022-08-04 ENCOUNTER — Emergency Department (HOSPITAL_COMMUNITY): Payer: Commercial Managed Care - PPO

## 2022-08-04 ENCOUNTER — Emergency Department (HOSPITAL_COMMUNITY)
Admission: EM | Admit: 2022-08-04 | Discharge: 2022-08-05 | Discharge status: Against Medical Advice | Payer: Commercial Managed Care - PPO | Attending: Emergency Medicine | Admitting: Emergency Medicine

## 2022-08-04 DIAGNOSIS — Z5321 Procedure and treatment not carried out due to patient leaving prior to being seen by health care provider: Secondary | ICD-10-CM | POA: Diagnosis not present

## 2022-08-04 DIAGNOSIS — R42 Dizziness and giddiness: Secondary | ICD-10-CM | POA: Insufficient documentation

## 2022-08-04 DIAGNOSIS — Z1152 Encounter for screening for COVID-19: Secondary | ICD-10-CM | POA: Insufficient documentation

## 2022-08-04 DIAGNOSIS — J101 Influenza due to other identified influenza virus with other respiratory manifestations: Secondary | ICD-10-CM | POA: Insufficient documentation

## 2022-08-04 DIAGNOSIS — R509 Fever, unspecified: Secondary | ICD-10-CM | POA: Diagnosis present

## 2022-08-04 LAB — RESP PANEL BY RT-PCR (RSV, FLU A&B, COVID)  RVPGX2
Influenza A by PCR: POSITIVE — AB
Influenza B by PCR: NEGATIVE
Resp Syncytial Virus by PCR: NEGATIVE
SARS Coronavirus 2 by RT PCR: NEGATIVE

## 2022-08-04 MED ORDER — ACETAMINOPHEN 325 MG PO TABS: 650.0000 mg | ORAL_TABLET | Freq: Once | ORAL | Status: DC

## 2022-08-04 NOTE — ED Triage Notes (Signed)
C/o weakness, dizziness, lightheaded, fever with t.max 101, sob at rest, and headache x2 days.

## 2022-08-04 NOTE — ED Provider Triage Note (Signed)
Emergency Medicine Provider Triage Evaluation Note  Lemon Sternberg , a 57 y.o. male  was evaluated in triage.  Patient complains of 2 days worth of lightheadedness, fevers around 101, headache, myalgias and chills.  Review of Systems  Positive:  Negative:   Physical Exam  BP (!) 169/71 (BP Location: Left Arm)   Pulse (!) 107   Temp (!) 100.4 F (38 C) (Oral)   Resp 16   Wt 86 kg   SpO2 97%   BMI 28.83 kg/m  Gen:   Awake, no distress   Resp:  Normal effort  MSK:   Moves extremities without difficulty  Other:  Sounds clear, tachycardic  Medical Decision Making  Medically screening exam initiated at 7:44 PM.  Appropriate orders placed.  Yuniel Blaney was informed that the remainder of the evaluation will be completed by another provider, this initial triage assessment does not replace that evaluation, and the importance of remaining in the ED until their evaluation is complete.     Rhae Hammock, [place] 08/04/22 1944

## 2022-08-05 ENCOUNTER — Ambulatory Visit: Payer: Commercial Managed Care - PPO | Admitting: Family

## 2022-08-05 NOTE — ED Notes (Signed)
Pt advised registration staff that they are leaving.

## 2022-08-05 NOTE — ED Notes (Signed)
Pt unable to be located in the lobby for vitals despite multiple attempts. Presumed to have LWBS. Pt discharged accordingly.

## 2023-07-22 ENCOUNTER — Emergency Department (HOSPITAL_BASED_OUTPATIENT_CLINIC_OR_DEPARTMENT_OTHER)
Admission: EM | Admit: 2023-07-22 | Discharge: 2023-07-22 | Disposition: A | Discharge status: Home / Self Care | Payer: Medicare (Managed Care) | Attending: Emergency Medicine | Admitting: Emergency Medicine

## 2023-07-22 ENCOUNTER — Encounter (HOSPITAL_BASED_OUTPATIENT_CLINIC_OR_DEPARTMENT_OTHER): Payer: Self-pay

## 2023-07-22 ENCOUNTER — Other Ambulatory Visit: Payer: Self-pay

## 2023-07-22 DIAGNOSIS — G20C Parkinsonism, unspecified: Secondary | ICD-10-CM | POA: Insufficient documentation

## 2023-07-22 DIAGNOSIS — I160 Hypertensive urgency: Secondary | ICD-10-CM | POA: Diagnosis not present

## 2023-07-22 DIAGNOSIS — I10 Essential (primary) hypertension: Secondary | ICD-10-CM | POA: Insufficient documentation

## 2023-07-22 DIAGNOSIS — Z79899 Other long term (current) drug therapy: Secondary | ICD-10-CM | POA: Diagnosis not present

## 2023-07-22 DIAGNOSIS — R519 Headache, unspecified: Secondary | ICD-10-CM | POA: Diagnosis present

## 2023-07-22 LAB — CBC
HCT: 55 % — ABNORMAL HIGH (ref 39.0–52.0)
Hemoglobin: 18.4 g/dL — ABNORMAL HIGH (ref 13.0–17.0)
MCH: 29.3 pg (ref 26.0–34.0)
MCHC: 33.5 g/dL (ref 30.0–36.0)
MCV: 87.6 fL (ref 80.0–100.0)
Platelets: 233 10*3/uL (ref 150–400)
RBC: 6.28 MIL/uL — ABNORMAL HIGH (ref 4.22–5.81)
RDW: 14.1 % (ref 11.5–15.5)
WBC: 6.9 10*3/uL (ref 4.0–10.5)
nRBC: 0 % (ref 0.0–0.2)

## 2023-07-22 LAB — BASIC METABOLIC PANEL
Anion gap: 8 (ref 5–15)
BUN: 15 mg/dL (ref 6–20)
CO2: 26 mmol/L (ref 22–32)
Calcium: 9.4 mg/dL (ref 8.9–10.3)
Chloride: 103 mmol/L (ref 98–111)
Creatinine, Ser: 0.94 mg/dL (ref 0.61–1.24)
GFR, Estimated: >60 mL/min (ref >60)
Glucose, Bld: 99 mg/dL (ref 70–99)
Potassium: 3.6 mmol/L (ref 3.5–5.1)
Sodium: 137 mmol/L (ref 135–145)

## 2023-07-22 MED ORDER — CARVEDILOL 6.25 MG PO TABS: 6.2500 mg | ORAL_TABLET | Freq: Two times a day (BID) | ORAL | 1 refills | Status: DC

## 2023-07-22 MED ORDER — HYDROCHLOROTHIAZIDE 25 MG PO TABS: ORAL_TABLET | ORAL | 0 refills | Status: DC

## 2023-07-22 MED ORDER — CLONIDINE HCL 0.1 MG PO TABS
0.1000 mg | ORAL_TABLET | Freq: Once | ORAL | Status: AC
  Administered 2023-07-22: 0.1 mg via ORAL
  Filled 2023-07-22: qty 1

## 2023-07-22 NOTE — ED Triage Notes (Addendum)
 Pt reports he is here today due to hypertension.Per patient he felt that his vision started changing in the few weeks. Pt reports he is not on his current bp medication because he felt his bp was doing good at home. Pt decide to check his bp yesterday because he felt a slight headache.

## 2023-07-22 NOTE — ED Provider Notes (Signed)
 [place] EMERGENCY DEPARTMENT AT [facility] Provider Note   CSN: [id number] Arrival date & time: 07/22/23  0025     History  Chief Complaint  Patient presents with   Hypertension    Jacob Foster is a 58 y.o. male.  Patient is a 58 year old male with past medical history of hypertension, Parkinson's disease, anxiety, GERD.  Patient presenting today for evaluation of elevated blood pressure.  For the past several days, patient has been feeling generally unwell.  He describes headache, blurry vision.  He has felt this way in the past when his blood pressure has been elevated.  In the past he has been on carvedilol  and hydrochlorothiazide , however he stopped taking these medications due to his having low blood pressure while being started on his Parkinson's medications.  He denies to me he is having any chest pain or difficulty breathing.  He did check his blood pressure at home and got readings in the 180 systolic range.  The history is provided by the patient.       Home Medications Prior to Admission medications   Medication Sig Start Date End Date Taking? Authorizing Provider  benzonatate  (TESSALON ) 100 MG capsule Take 1 capsule (100 mg total) by mouth 3 (three) times daily as needed for cough. 11/07/21   Levora Reyes SAUNDERS, MD  carbidopa -levodopa  (SINEMET  IR) 25-100 MG tablet Take 1 tablet by mouth 3 (three) times daily. 5am/10am/3pm Patient taking differently: Take 1 tablet by mouth 3 (three) times daily. 0700, 1200, 1700 09/20/20   Tat, Asberry RAMAN, DO  carvedilol  (COREG ) 6.25 MG tablet Take 1 tablet (6.25 mg total) by mouth 2 (two) times daily. 09/11/20   Tabori, Katherine E, MD  hydrochlorothiazide  (HYDRODIURIL ) 25 MG tablet TAKE 1 TABLET(25 MG) BY MOUTH DAILY 01/27/22   Kip Ade, NP  omeprazole  (PRILOSEC) 20 MG capsule TAKE 1 CAPSULE BY MOUTH EVERY DAY 10/01/21   Kennedy-Smith, Colleen M, NP  rosuvastatin  (CRESTOR ) 10 MG tablet Take 1 tablet (10 mg total) by mouth  daily. 02/26/21 07/17/21  Kate Lonni CROME, MD      Allergies    Patient has no known allergies.    Review of Systems   Review of Systems  All other systems reviewed and are negative.   Physical Exam Updated Vital Signs BP (!) 173/111   Pulse 85   Temp 98 F (36.7 C) (Oral)   Resp 18   SpO2 99%  Physical Exam Vitals and nursing note reviewed.  Constitutional:      General: He is not in acute distress.    Appearance: He is well-developed. He is not diaphoretic.  HENT:     Head: Normocephalic and atraumatic.  Eyes:     Extraocular Movements: Extraocular movements intact.     Pupils: Pupils are equal, round, and reactive to light.  Cardiovascular:     Rate and Rhythm: Normal rate and regular rhythm.     Heart sounds: No murmur heard.    No friction rub.  Pulmonary:     Effort: Pulmonary effort is normal. No respiratory distress.     Breath sounds: Normal breath sounds. No wheezing or rales.  Abdominal:     General: Bowel sounds are normal. There is no distension.     Palpations: Abdomen is soft.     Tenderness: There is no abdominal tenderness.  Musculoskeletal:        General: Normal range of motion.     Cervical back: Normal range of motion and neck supple.  Skin:    General: Skin is warm and dry.  Neurological:     General: No focal deficit present.     Mental Status: He is alert and oriented to person, place, and time.     Cranial Nerves: No cranial nerve deficit.     Coordination: Coordination normal.     ED Results / Procedures / Treatments   Labs (all labs ordered are listed, but only abnormal results are displayed) Labs Reviewed  CBC - Abnormal; Notable for the following components:      Result Value   RBC 6.28 (*)    Hemoglobin 18.4 (*)    HCT 55.0 (*)    All other components within normal limits  BASIC METABOLIC PANEL    EKG EKG Interpretation Date/Time:  Wednesday July 22 2023 00:35:05 EST Ventricular Rate:  67 PR  Interval:  152 QRS Duration:  90 QT Interval:  386 QTC Calculation: 407 R Axis:   85  Text Interpretation: Normal sinus rhythm with sinus arrhythmia Nonspecific T wave abnormality Abnormal ECG When compared with ECG of 04-Aug-2022 18:42,no significant change is noted Confirmed by Geroldine Berg ([id number]) on 07/22/2023 1:47:40 AM  Radiology No results found.  Procedures Procedures    Medications Ordered in ED Medications  cloNIDine  (CATAPRES ) tablet 0.1 mg (has no administration in time range)    ED Course/ Medical Decision Making/ A&P  Patient is a 58 year old male presenting with elevated blood pressure, headache, and blurry vision.  He has been off of his blood pressure medications for many months due to his blood pressures being low secondary to his Parkinson's medications.  Patient arrives here with stable vital signs and is afebrile.  Initial blood pressure is 166/108.  Physical examination unremarkable.  Laboratory studies obtained including CBC and basic metabolic panel.  His hemoglobin is 18.4, but laboratory studies otherwise unremarkable.  Patient will be given a dose of clonidine  here in the ER and discharged with prescriptions for his carvedilol  and hydrochlorothiazide  that he was taking previously.  Patient to keep a record of his blood pressures and follow-up with his primary doctor.  No signs or symptoms of stroke and no signs of endorgan damage in his laboratory studies or EKG.  Final Clinical Impression(s) / ED Diagnoses Final diagnoses:  None    Rx / DC Orders ED Discharge Orders     None         Geroldine Berg, MD 07/22/23 [phone]

## 2023-07-22 NOTE — Discharge Instructions (Signed)
 Begin taking carvedilol  and hydrochlorothiazide  as prescribed this evening.  Keep a record of your blood pressures at home and take this with you to your next doctor's appointment, ideally in the next 1 to 2 weeks.  Return to the ER if your symptoms significantly worsen or change.

## 2023-10-12 ENCOUNTER — Other Ambulatory Visit: Payer: Self-pay | Admitting: Family Medicine

## 2023-10-12 DIAGNOSIS — I10 Essential (primary) hypertension: Secondary | ICD-10-CM

## 2023-10-16 ENCOUNTER — Ambulatory Visit (INDEPENDENT_AMBULATORY_CARE_PROVIDER_SITE_OTHER): Payer: Medicare (Managed Care) | Admitting: Student in an Organized Health Care Education/Training Program

## 2023-10-16 ENCOUNTER — Encounter: Payer: Self-pay | Admitting: Student in an Organized Health Care Education/Training Program

## 2023-10-16 VITALS — BP 126/78 | HR 59 | Temp 97.8°F | Ht 68.0 in | Wt 191.4 lb

## 2023-10-16 DIAGNOSIS — K219 Gastro-esophageal reflux disease without esophagitis: Secondary | ICD-10-CM | POA: Diagnosis not present

## 2023-10-16 DIAGNOSIS — G8929 Other chronic pain: Secondary | ICD-10-CM

## 2023-10-16 DIAGNOSIS — D751 Secondary polycythemia: Secondary | ICD-10-CM | POA: Diagnosis not present

## 2023-10-16 DIAGNOSIS — I10 Essential (primary) hypertension: Secondary | ICD-10-CM

## 2023-10-16 DIAGNOSIS — M25572 Pain in left ankle and joints of left foot: Secondary | ICD-10-CM | POA: Diagnosis not present

## 2023-10-16 DIAGNOSIS — G20A1 Parkinson's disease without dyskinesia, without mention of fluctuations: Secondary | ICD-10-CM | POA: Diagnosis not present

## 2023-10-16 LAB — CBC WITH DIFFERENTIAL/PLATELET
Basophils Absolute: 0 10*3/uL (ref 0.0–0.1)
Basophils Relative: 0.9 % (ref 0.0–3.0)
Eosinophils Absolute: 0.1 10*3/uL (ref 0.0–0.7)
Eosinophils Relative: 2.4 % (ref 0.0–5.0)
HCT: 54.7 % — ABNORMAL HIGH (ref 39.0–52.0)
Hemoglobin: 18.4 g/dL (ref 13.0–17.0)
Lymphocytes Relative: 48.8 % — ABNORMAL HIGH (ref 12.0–46.0)
Lymphs Abs: 2.4 10*3/uL (ref 0.7–4.0)
MCHC: 33.7 g/dL (ref 30.0–36.0)
MCV: 89 fl (ref 78.0–100.0)
Monocytes Absolute: 0.4 10*3/uL (ref 0.1–1.0)
Monocytes Relative: 8.9 % (ref 3.0–12.0)
Neutro Abs: 1.9 10*3/uL (ref 1.4–7.7)
Neutrophils Relative %: 39 % — ABNORMAL LOW (ref 43.0–77.0)
Platelets: 206 10*3/uL (ref 150.0–400.0)
RBC: 6.15 Mil/uL — ABNORMAL HIGH (ref 4.22–5.81)
RDW: 14.3 % (ref 11.5–15.5)
WBC: 4.9 10*3/uL (ref 4.0–10.5)

## 2023-10-16 LAB — LIPID PANEL
Cholesterol: 242 mg/dL — ABNORMAL HIGH (ref 0–200)
HDL: 41.1 mg/dL (ref >39.00)
LDL Cholesterol: 169 mg/dL — ABNORMAL HIGH (ref 0–99)
NonHDL: 200.43
Total CHOL/HDL Ratio: 6
Triglycerides: 156 mg/dL — ABNORMAL HIGH (ref 0.0–149.0)
VLDL: 31.2 mg/dL (ref 0.0–40.0)

## 2023-10-16 LAB — HEMOGLOBIN A1C: Hgb A1c MFr Bld: 5.6 % (ref 4.6–6.5)

## 2023-10-16 MED ORDER — OMEPRAZOLE 20 MG PO CPDR: 20.0000 mg | DELAYED_RELEASE_CAPSULE | Freq: Every day | ORAL | 1 refills | Status: DC

## 2023-10-16 MED ORDER — HYDROCHLOROTHIAZIDE 25 MG PO TABS: 25.0000 mg | ORAL_TABLET | Freq: Every day | ORAL | 3 refills | Status: DC

## 2023-10-16 NOTE — Assessment & Plan Note (Addendum)
 Intermittent issue, with moderate symptom burden recently.  Plan to restart omeprazole 20 mg daily.  No red flag symptoms.

## 2023-10-16 NOTE — Assessment & Plan Note (Signed)
 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  He has no history of tobacco use, no chronic lung disease to suggest chronic hypoxia, and no history of testosterone use.  Because has been going on so long I doubt this represents an RCC.  Could be at risk for polycythemia vera, and would want to figure this out before he undergoes any kind of deep brain stimulator.  Will check CBC today with EPO level and JAK2 testing.

## 2023-10-16 NOTE — Assessment & Plan Note (Signed)
 Chronic and stable with moderate symptom burden.  Doing well on Sinemet with selegiline.  This is being comanaged with neurology.  He is also considering a deep brain stimulator in the future to improve his tremor burden.

## 2023-10-16 NOTE — Assessment & Plan Note (Signed)
 Chronic and stable.  Blood pressure well-controlled at 126/78.  Currently using hydrochlorothiazide 25 mg daily and low-dose carvedilol twice daily.  Unclear to me why he is on a beta-blocker at this time.  Will continue for now but may be able to transition the beta-blocker to an ARB in the future if needed.

## 2023-10-16 NOTE — Progress Notes (Signed)
 New Patient Office Visit  Subjective    Patient ID: Jacob Foster, male    DOB: 30-Mar-1966  Age: 58 y.o. MRN: [id number]  CC:  Chief Complaint  Patient presents with   Establish Care    Pt is doing pretty well, notes does need his BP med refilled ran out of med 3 days ago     HPI  Jacob Foster presents to establish care  58 year old person living with Parkinson's disease here for management of hypertension.  He previously worked as an Artist.  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  Now he lives in [place] with his wife and has 4 adult children.  He has been managing Parkinson's disease for many years.  He still lives a very active lifestyle, going to exercise classes at least twice weekly, totally functional and all activities of daily living.  He reports good adherence to his medications without side effects.  He is considering a deep brain stimulator surgery in the future.  He has had no recent illnesses, no hospitalizations, or surgeries.  Denies chest pain, pressure, or dyspnea limiting his exercise.  No recent fevers or chills.  Outpatient Encounter Medications as of 10/16/2023  Medication Sig   [DISCONTINUED] hydrochlorothiazide (HYDRODIURIL) 25 MG tablet TAKE 1 TABLET(25 MG) BY MOUTH DAILY   carbidopa-levodopa (SINEMET IR) 25-100 MG tablet Take 1 tablet by mouth 3 (three) times daily. 5am/10am/3pm (Patient not taking: Reported on 10/16/2023)   carvedilol (COREG) 6.25 MG tablet Take 1 tablet (6.25 mg total) by mouth 2 (two) times daily. (Patient not taking: Reported on 10/16/2023)   hydrochlorothiazide (HYDRODIURIL) 25 MG tablet Take 1 tablet (25 mg total) by mouth daily. TAKE 1 TABLET(25 MG) BY MOUTH DAILY   omeprazole (PRILOSEC) 20 MG capsule Take 1 capsule (20 mg total) by mouth daily.   rosuvastatin (CRESTOR) 10 MG tablet Take 1 tablet (10 mg total) by mouth daily.   selegiline (ELDEPRYL) 5 MG tablet Take 5 mg by mouth 2 (two) times daily.   [DISCONTINUED]  benzonatate (TESSALON) 100 MG capsule Take 1 capsule (100 mg total) by mouth 3 (three) times daily as needed for cough.   [DISCONTINUED] omeprazole (PRILOSEC) 20 MG capsule TAKE 1 CAPSULE BY MOUTH EVERY DAY (Patient not taking: Reported on 10/16/2023)   No facility-administered encounter medications on file as of 10/16/2023.    Past Medical History:  Diagnosis Date   Abscess of axilla    Arthritis    Depression    seasonal affective D/O    Family history of colon cancer 12/08/2018   Father and brother. Sees Dr. Fritzi Mandes   GERD (gastroesophageal reflux disease)    Hidradenitis suppurativa    History of chickenpox    HTN (hypertension)    Hyperlipemia    Medical history non-contributory    Neck pain    Serrated polyp of colon 12/08/2018   Sleep apnea     Past Surgical History:  Procedure Laterality Date   ABSCESS DRAINAGE     ELBOW SURGERY      Family History  Problem Relation Age of Onset   Hypertension Mother    Hyperlipidemia Mother    Breast cancer Mother    Prostate cancer Father    Kidney disease Father    Colon cancer Father        in his 54's   Throat cancer Brother        dies age 5 - alcoholic    Hypertension Brother  Esophageal cancer Brother    Healthy Daughter    Healthy Son    Healthy Son    Healthy Son    Diabetes Brother    Colon cancer Brother 79   Colon cancer Brother 24   Colon cancer Maternal Uncle    Colon cancer Maternal Uncle    Colon polyps Neg Hx    Stomach cancer Neg Hx    Rectal cancer Neg Hx     Social History   Socioeconomic History   Marital status: Married    Spouse name: Patent examiner   Number of children: 4   Years of education: Not on file   Highest education level: Not on file  Occupational History   Occupation: Dietitian: [employer]  Tobacco Use   Smoking status: Never   Smokeless tobacco: Never  Vaping Use   Vaping status: Never Used  Substance and Sexual Activity   Alcohol use: No   Drug use:  No   Sexual activity: Yes  Other Topics Concern   Not on file  Social History Narrative   Originally from [facility]   Social Drivers of Health   Financial Resource Strain: Not on file  Food Insecurity: No Food Insecurity (11/06/2020)   Received from [facility], [facility]   Hunger Vital Sign    Worried About Running Out of Food in the Last Year: Never true    Ran Out of Food in the Last Year: Never true  Transportation Needs: Not on file  Physical Activity: Not on file  Stress: Not on file  Social Connections: Unknown (11/25/2021)   Received from [facility], [facility]   Social Network    Social Network: Not on file  Intimate Partner Violence: Unknown (10/17/2021)   Received from [employer], [facility]   HITS    Physically Hurt: Not on file    Insult or Talk Down To: Not on file    Threaten Physical Harm: Not on file    Scream or Curse: Not on file    ROS      Objective    BP 126/78   Pulse (!) 59   Temp 97.8 F (36.6 C) (Temporal)   Ht 5\' 8"  (1.727 m)   Wt 191 lb 6.4 oz (86.8 kg)   SpO2 99%   BMI 29.10 kg/m   Physical Exam  General: Well-appearing man, no distress Neuro: Masked face but conversational, pill-rolling tremor at rest, mild cogwheel rigidity in the upper extremities, could get up and go, mildly wide-based gait Ears: Normal bilateral tympanic membranes Eyes: Normal bilaterally Neck: Normal thyroid, no nodules Heart: Regular, no murmur Lungs: Unlabored, clear to auscultation throughout Abd: Soft, nontender, no organomegaly Ext: Warm, well-perfused, normal muscle bulk, increased tone, no edema, left ankle has some stiffness and decreased range of motion, no effusion, no point tenderness     Assessment & Plan:   Problem List Items Addressed This Visit       High   Parkinson's disease (HCC) (Chronic)   Chronic and stable with moderate symptom burden.  Doing well on Sinemet with selegiline.  This is being comanaged with neurology.  He  is also considering a deep brain stimulator in the future to improve his tremor burden.      Relevant Medications   selegiline (ELDEPRYL) 5 MG tablet     Medium    Gastroesophageal reflux disease without esophagitis (Chronic)   Intermittent issue, with moderate symptom burden recently.  Plan  to restart omeprazole 20 mg daily.  No red flag symptoms.      Relevant Medications   omeprazole (PRILOSEC) 20 MG capsule   Essential hypertension (Chronic)   Chronic and stable.  Blood pressure well-controlled at 126/78.  Currently using hydrochlorothiazide 25 mg daily and low-dose carvedilol twice daily.  Unclear to me why he is on a beta-blocker at this time.  Will continue for now but may be able to transition the beta-blocker to an ARB in the future if needed.      Relevant Medications   hydrochlorothiazide (HYDRODIURIL) 25 MG tablet   Other Relevant Orders   Lipid panel   Hemoglobin A1c   Erythrocytosis (Chronic)   Chronic erythrocytosis noted on labs since at least 2019.  He has no history of tobacco use, no chronic lung disease to suggest chronic hypoxia, and no history of testosterone use.  Because has been going on so long I doubt this represents an RCC.  Could be at risk for polycythemia vera, and would want to figure this out before he undergoes any kind of deep brain stimulator.  Will check CBC today with EPO level and JAK2 testing.      Relevant Orders   CBC with Differential/Platelet   Erythropoietin   JAK2 genotypr   Left ankle pain - Primary   Moderate intermittent issue that sometimes limits his ability to exercise.  On exam he has moderate pes planus bilaterally.  Issues have an unusual wear pattern on the outer heel.  I think his gait is probably off balance because of his Parkinson disease and low arch.  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  No signs of acute injury to the ankle today, no effusion.  I think he would do well with orthotics to help with his low arch and  improve his gait.      Relevant Orders   Ambulatory referral to Podiatry    Return in about 3 months (around 01/15/2024).   Tyson Alias, MD

## 2023-10-16 NOTE — Assessment & Plan Note (Signed)
 Moderate intermittent issue that sometimes limits his ability to exercise.  On exam he has moderate pes planus bilaterally.  Issues have an unusual wear pattern on the outer heel.  I think his gait is probably off balance because of his Parkinson disease and low arch.  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  No signs of acute injury to the ankle today, no effusion.  I think he would do well with orthotics to help with his low arch and improve his gait.

## 2023-10-19 LAB — ERYTHROPOIETIN: Erythropoietin: 9.7 m[IU]/mL (ref 2.6–18.5)

## 2023-10-21 ENCOUNTER — Ambulatory Visit (INDEPENDENT_AMBULATORY_CARE_PROVIDER_SITE_OTHER): Payer: Medicare (Managed Care) | Admitting: Podiatry

## 2023-10-21 ENCOUNTER — Encounter: Payer: Self-pay | Admitting: Student in an Organized Health Care Education/Training Program

## 2023-10-21 DIAGNOSIS — Z91199 Patient's noncompliance with other medical treatment and regimen due to unspecified reason: Secondary | ICD-10-CM

## 2023-10-21 LAB — JAK2 GENOTYPR

## 2023-10-21 NOTE — Progress Notes (Signed)
 No show

## 2023-10-22 ENCOUNTER — Encounter: Payer: Self-pay | Admitting: Podiatry

## 2023-10-22 ENCOUNTER — Telehealth: Payer: Self-pay

## 2023-10-22 ENCOUNTER — Ambulatory Visit (INDEPENDENT_AMBULATORY_CARE_PROVIDER_SITE_OTHER): Payer: Medicare (Managed Care) | Admitting: Podiatry

## 2023-10-22 ENCOUNTER — Ambulatory Visit (INDEPENDENT_AMBULATORY_CARE_PROVIDER_SITE_OTHER): Payer: Medicare (Managed Care)

## 2023-10-22 VITALS — Ht 68.0 in | Wt 191.0 lb

## 2023-10-22 DIAGNOSIS — M7752 Other enthesopathy of left foot: Secondary | ICD-10-CM

## 2023-10-22 MED ORDER — TRIAMCINOLONE ACETONIDE 10 MG/ML IJ SUSP
10.0000 mg | Freq: Once | INTRAMUSCULAR | Status: AC
  Administered 2023-10-22: 10 mg via INTRA_ARTICULAR

## 2023-10-22 MED ORDER — ROSUVASTATIN CALCIUM 10 MG PO TABS: 10.0000 mg | ORAL_TABLET | Freq: Every day | ORAL | 3 refills | Status: AC

## 2023-10-22 NOTE — Telephone Encounter (Signed)
 Spoke with the patient's by phone.  Talked about hyperlipidemia.The 10-year ASCVD risk score (Arnett DK, et al., 2019) is: 11.9% Recommended starting rosuvastatin 10 mg daily.  This was on his medication list but he tells me he never started it in the past.  Should be low risk for side effects given his underlying Parkinson's.  Will follow-up lipids in about 2 months.  Goal LDL around 100.  This is for primary prevention.

## 2023-10-22 NOTE — Telephone Encounter (Signed)
 Patient would like more information regarding medication and diet. Please advise.

## 2023-10-22 NOTE — Telephone Encounter (Signed)
 Copied from CRM [id number]. Topic: Clinical - Medication Question >> Oct 22, 2023  8:24 AM Elizebeth Brooking wrote: Reason for CRM: Patient wife called regarding medication rosuvastatin (CRESTOR) 10 MG tablet , stated Dr.Vincent stated he should be taking that but he hasn't been prescribed that recently, should he starting taking it again , also   Would like to know about diet that will assist with Cholesterol and high blood pressure , Dr.Vincent also mention that his hemoglobin is high and wants to know the next step on how to get that lowered   [phone]

## 2023-10-22 NOTE — Telephone Encounter (Signed)
 Would like me to call the respiratory department to set up this pulmonary function testing?

## 2023-10-22 NOTE — Telephone Encounter (Signed)
 Left a voicemail for the patient about the JAK2 results.  Would like to talk to him about doing more investigation into possible chronic hypoxia.  Would like to do pulmonary function testing, 6-minute walk testing, and possibly a home sleep study.

## 2023-10-22 NOTE — Telephone Encounter (Signed)
 No thank you.  I spoke with the patient's.  He has a recent diagnosis of obstructive sleep apnea on a home sleep study in January, this was being managed by [facility] neurology.  He did not get follow-up, did not start sleep apnea treatment.  He is at risk for sleep apnea due to underlying Parkinson's.  I think this is probably the culprit causing the polycythemia.  I recommended he reach back out to the physicians at [facility] for follow-up on the sleep study and to start CPAP therapy.  He can call me back if they have a delayed response and I would be happy to order the CPAP as well.

## 2023-10-22 NOTE — Progress Notes (Signed)
 Subjective:   Patient ID: Jacob Foster, male   DOB: 58 y.o.   MRN: [id number]   HPI Patient presents with a lot of inflammation pain in his left ankle with no history of injury.  States it has been hurting him for a number of months and over the last few months has been worse and states that he does have Parkinson's that he is being treated for but it does affect his gait.  Patient does not smoke and tries to be active   Review of Systems  All other systems reviewed and are negative.       Objective:  Physical Exam Vitals and nursing note reviewed.  Constitutional:      Appearance: He is well-developed.  Pulmonary:     Effort: Pulmonary effort is normal.  Musculoskeletal:        General: Normal range of motion.  Skin:    General: Skin is warm.  Neurological:     Mental Status: He is alert.     Neurovascular status intact muscle strength adequate range of motion within normal limits with quite a bit of inflammation fluid of the left sinus tarsi ankle with slight loss of motion of the joint but it appears to be more trying to compensate for discomfort.  Patient has good digital perfusion well-oriented x 3     Assessment:  Inflammatory capsulitis of the sinus tarsi and into the ankle joint left fluid buildup that may be due to gait change secondary to Parkinson's     Plan:  H&P reviewed and today I went ahead did sterile prep and injected the capsule of the joint 3 mg Kenalog 5 mg Xylocaine advised on anti-inflammatory support therapy and discussed MRI or CT scan if symptoms persist to rule out any kind of chronic arthritic condition.  Reviewed x-rays with patient today and did educate him on the importance of balance with the Parkinson's  X-rays indicate that there does not appear to be advanced arthritis of the subtalar joint based on conventional x-rays

## 2023-10-23 NOTE — Telephone Encounter (Signed)
 Would you like me to schedule lab only visit in 2 months for that lipid? He does have a follow up appointment in 3 months.

## 2023-10-23 NOTE — Telephone Encounter (Signed)
 No thanks. We can check the lipids at his next visit.

## 2023-10-27 ENCOUNTER — Telehealth: Payer: Self-pay

## 2023-10-27 NOTE — Telephone Encounter (Signed)
 I tried to call, left VM

## 2023-10-27 NOTE — Telephone Encounter (Signed)
 Copied from CRM [id number]. Topic: Clinical - Lab/Test Results >> Oct 27, 2023  8:31 AM Allyne Areola wrote: Reason for CRM: Patient's wife Vernon Maish is calling with patient on the line regarding recent lab results. They would like to know if there is anything patient should be doing or medication he should be taking to improve results. They would like to speak with Dr.Duncan directly and not a nurse.

## 2023-10-28 ENCOUNTER — Telehealth: Payer: Self-pay

## 2023-10-28 NOTE — Telephone Encounter (Signed)
 Thank you.  I spoke with the patient and his wife and answered all their questions.

## 2023-10-28 NOTE — Telephone Encounter (Signed)
 Copied from CRM [id number]. Topic: General - Other >> Oct 28, 2023  8:57 AM Albertha Alosa wrote: Reason for CRM: Patient wife called back in today, stating they missed the call from Dr.Vincent yesterday and would like for Dr. Gayl Katos to give them a call back some time today

## 2024-01-18 ENCOUNTER — Ambulatory Visit (INDEPENDENT_AMBULATORY_CARE_PROVIDER_SITE_OTHER): Payer: Medicare (Managed Care) | Admitting: Student in an Organized Health Care Education/Training Program

## 2024-01-18 ENCOUNTER — Encounter: Payer: Self-pay | Admitting: Student in an Organized Health Care Education/Training Program

## 2024-01-18 VITALS — BP 138/89 | HR 80 | Wt 191.0 lb

## 2024-01-18 DIAGNOSIS — D751 Secondary polycythemia: Secondary | ICD-10-CM | POA: Diagnosis not present

## 2024-01-18 DIAGNOSIS — E785 Hyperlipidemia, unspecified: Secondary | ICD-10-CM | POA: Diagnosis not present

## 2024-01-18 DIAGNOSIS — I10 Essential (primary) hypertension: Secondary | ICD-10-CM

## 2024-01-18 DIAGNOSIS — G20A1 Parkinson's disease without dyskinesia, without mention of fluctuations: Secondary | ICD-10-CM | POA: Diagnosis not present

## 2024-01-18 DIAGNOSIS — G4733 Obstructive sleep apnea (adult) (pediatric): Secondary | ICD-10-CM | POA: Insufficient documentation

## 2024-01-18 NOTE — Assessment & Plan Note (Signed)
 Well-controlled with hydrochlorothiazide . Current blood pressure 138/89 mmHg. - Continue hydrochlorothiazide  25 mg daily.

## 2024-01-18 NOTE — Assessment & Plan Note (Signed)
 Likely secondary to obstructive sleep apnea. Genetic testing ruled out polycythemia vera. Improvement expected with better sleep apnea control. - Monitor blood counts in three months. - Continue CPAP therapy to improve sleep apnea.

## 2024-01-18 NOTE — Assessment & Plan Note (Signed)
 Managed with rosuvastatin  10 mg daily. No issues reported, tolerating it well. Cholesterol levels to be re-evaluated in three months with next blood draw. - Continue rosuvastatin . - Check cholesterol levels in three months.

## 2024-01-18 NOTE — Assessment & Plan Note (Signed)
 Managed with CPAP therapy. Difficulty maintaining mask use due to discomfort. CPAP crucial for managing sleep apnea and associated erythrocytosis. - Continue using CPAP therapy. - Follow up with sleep medicine specialists for mask fitting and comfort strategies.

## 2024-01-18 NOTE — Progress Notes (Signed)
 New Patient Office Visit  Subjective    Patient ID: Jacob Foster, male    DOB: [birth year]  Age: 57 y.o. MRN: [id number]  CC:  Chief Complaint  Patient presents with   Medical Management of Chronic Issues    3 month follow up for chronic left ankle pain. Patient states no issues at all with left ankle.     HPI  Discussed the use of AI scribe software for clinical note transcription with the patient, who gave verbal consent to proceed.  History of Present Illness Jacob Foster is a 58 year old male with Parkinson's disease who presents with worsening tremors.  Parkinsonian tremor and motor symptoms - Worsening tremors, more pronounced when idle (e.g., sitting) and improve with physical activity such as walking - Recent change in medication from selegiline to pramipexole approximately one month ago - Klonopin taken at bedtime for symptom management  Obstructive sleep apnea and cpap tolerance - Using CPAP machine for approximately one month - Difficulty maintaining CPAP use throughout the night; often finds the device on the floor in the morning - Occasional sensation of restricted breathing with CPAP use - Practicing wearing CPAP while awake to improve tolerance  Gastroesophageal reflux symptoms - GERD symptoms improved - No recent need for omeprazole   Hypertension management - Blood pressure well-controlled with hydrochlorothiazide  25 mg daily - No side effects from antihypertensive therapy - Carvedilol  discontinued previously - No recent episodes of lightheadedness  Hyperlipidemia management - Rosuvastatin  recently initiated for hyperlipidemia - No adverse effects from statin therapy  Musculoskeletal pain - Previous ankle pain resolved - No current musculoskeletal complaints   Outpatient Encounter Medications as of 01/18/2024  Medication Sig   carbidopa -levodopa  (SINEMET  CR) 50-200 MG tablet Take 1 tablet by mouth 3 (three) times daily.   clonazePAM (KLONOPIN) 0.5 MG  tablet Take 0.5 mg by mouth at bedtime.   hydrochlorothiazide  (HYDRODIURIL ) 25 MG tablet Take 1 tablet (25 mg total) by mouth daily. TAKE 1 TABLET(25 MG) BY MOUTH DAILY   pramipexole (MIRAPEX) 0.25 MG tablet Take by mouth.   rosuvastatin  (CRESTOR ) 10 MG tablet Take 1 tablet (10 mg total) by mouth daily.   [DISCONTINUED] omeprazole  (PRILOSEC) 20 MG capsule Take 1 capsule (20 mg total) by mouth daily. (Patient not taking: Reported on 01/18/2024)   [DISCONTINUED] selegiline (ELDEPRYL) 5 MG tablet Take 5 mg by mouth 2 (two) times daily. (Patient not taking: Reported on 01/18/2024)   No facility-administered encounter medications on file as of 01/18/2024.      Objective    BP 138/89   Pulse 80   Wt 191 lb (86.6 kg)   SpO2 99%   BMI 29.04 kg/m   Physical Exam  Physical Exam GENERAL: No acute distress, well developed, well nourished. NECK: Thyroid  normal. CHEST: Lungs clear to auscultation bilaterally. CARDIOVASCULAR: Regular rate and rhythm, no murmurs. EXTREMITIES: No edema in extremities. NEUROLOGICAL: Mild to moderate pill rolling tremor, left side and left leg. No cogwheel rigidity in left arm. Mild cogwheel rigidity in right arm. SKIN: Mild heat Rash on back.     Assessment & Plan:   Assessment and Plan  Problem List Items Addressed This Visit       High   Parkinson's disease (HCC) (Chronic)   Worsening tremors despite medication changes. Neurologist switched to pramipexole and added clonazepam. Considering deep brain stimulation due to increased tremor severity. - Continue sinemet , pramipexole and clonazepam as prescribed by neurologist. - Considering deep brain stimulation for tremor management. - Follow up with  neurologist for ongoing management. - Doing well with activity, good functional status, good quality of life      Relevant Medications   clonazePAM (KLONOPIN) 0.5 MG tablet   carbidopa -levodopa  (SINEMET  CR) 50-200 MG tablet   pramipexole (MIRAPEX) 0.25 MG tablet    Obstructive sleep apnea (Chronic)   Managed with CPAP therapy. Difficulty maintaining mask use due to discomfort. CPAP crucial for managing sleep apnea and associated erythrocytosis. - Continue using CPAP therapy. - Follow up with sleep medicine specialists for mask fitting and comfort strategies.        Medium    Essential hypertension (Chronic)   Well-controlled with hydrochlorothiazide . Current blood pressure 138/89 mmHg. - Continue hydrochlorothiazide  25 mg daily.      Erythrocytosis (Chronic)   Likely secondary to obstructive sleep apnea. Genetic testing ruled out polycythemia vera. Improvement expected with better sleep apnea control. - Monitor blood counts in three months. - Continue CPAP therapy to improve sleep apnea.      Hyperlipidemia - Primary (Chronic)   Managed with rosuvastatin  10 mg daily. No issues reported, tolerating it well. Cholesterol levels to be re-evaluated in three months with next blood draw. - Continue rosuvastatin . - Check cholesterol levels in three months.       Return in about 3 months (around 04/19/2024) for cholesterol and cbc recheck.   Jacob Debby Specking, MD

## 2024-01-18 NOTE — Assessment & Plan Note (Signed)
 Worsening tremors despite medication changes. Neurologist switched to pramipexole and added clonazepam. Considering deep brain stimulation due to increased tremor severity. - Continue sinemet , pramipexole and clonazepam as prescribed by neurologist. - Considering deep brain stimulation for tremor management. - Follow up with neurologist for ongoing management. - Doing well with activity, good functional status, good quality of life

## 2024-01-18 NOTE — Patient Instructions (Signed)
 VISIT SUMMARY:  Today, we discussed your worsening tremors, sleep apnea management, and other ongoing health concerns. We reviewed your current medications and made plans for follow-up care.  YOUR PLAN:  -PARKINSON'S DISEASE: Parkinson's Disease is a disorder of the nervous system that affects movement. Your tremors have worsened despite recent medication changes. You should continue taking pramipexole and clonazepam as prescribed. We are considering deep brain stimulation to help manage your tremors. Please follow up with your neurologist for ongoing management.  -OBSTRUCTIVE SLEEP APNEA: Obstructive Sleep Apnea is a condition where your breathing stops and starts during sleep. You are having difficulty maintaining CPAP use throughout the night. It is important to continue using CPAP therapy. Please follow up with sleep medicine specialists for mask fitting and comfort strategies.  -ERYTHROCYTOSIS: Erythrocytosis is an increase in red blood cells, likely due to your sleep apnea. We expect improvement with better control of your sleep apnea. We will monitor your blood counts in three months. Continue using CPAP therapy to help manage this condition.  -HYPERTENSION: Hypertension is high blood pressure. Your blood pressure is well-controlled with hydrochlorothiazide . Continue taking hydrochlorothiazide  25 mg daily.  -HYPERLIPIDEMIA: Hyperlipidemia is high cholesterol. You are currently managing this with rosuvastatin . We will re-evaluate your cholesterol levels in three months. Continue taking rosuvastatin  as prescribed.  INSTRUCTIONS:  Please follow up with your neurologist for ongoing management of Parkinson's Disease. Additionally, follow up with sleep medicine specialists for mask fitting and comfort strategies for your CPAP therapy. We will monitor your blood counts and re-evaluate your cholesterol levels in three months.

## 2024-04-08 ENCOUNTER — Other Ambulatory Visit: Payer: Self-pay

## 2024-04-08 ENCOUNTER — Emergency Department (HOSPITAL_BASED_OUTPATIENT_CLINIC_OR_DEPARTMENT_OTHER)
Admission: EM | Admit: 2024-04-08 | Discharge: 2024-04-09 | Disposition: A | Discharge status: Home / Self Care | Payer: Medicare (Managed Care) | Attending: Emergency Medicine | Admitting: Emergency Medicine

## 2024-04-08 DIAGNOSIS — I10 Essential (primary) hypertension: Secondary | ICD-10-CM | POA: Diagnosis not present

## 2024-04-08 DIAGNOSIS — M545 Low back pain, unspecified: Secondary | ICD-10-CM | POA: Diagnosis present

## 2024-04-08 NOTE — ED Triage Notes (Signed)
 Pt having left sided back pain worse with palpationg. No urinary symptoms. No injury. Pt is able to walk but with difficulty. OTC meds not working

## 2024-04-09 LAB — URINALYSIS, W/ REFLEX TO CULTURE (INFECTION SUSPECTED)
Bacteria, UA: NONE SEEN
Bilirubin Urine: NEGATIVE
Glucose, UA: NEGATIVE mg/dL
Ketones, ur: NEGATIVE mg/dL
Leukocytes,Ua: NEGATIVE
Nitrite: NEGATIVE
Specific Gravity, Urine: 1.023 (ref 1.005–1.030)
pH: 6.5 (ref 5.0–8.0)

## 2024-04-09 MED ORDER — PREDNISONE 10 MG PO TABS: 20.0000 mg | ORAL_TABLET | Freq: Two times a day (BID) | ORAL | 0 refills | Status: DC

## 2024-04-09 MED ORDER — KETOROLAC TROMETHAMINE 60 MG/2ML IM SOLN
60.0000 mg | Freq: Once | INTRAMUSCULAR | Status: AC
  Administered 2024-04-09: 60 mg via INTRAMUSCULAR
  Filled 2024-04-09: qty 2

## 2024-04-09 MED ORDER — PREDNISONE 20 MG PO TABS
40.0000 mg | ORAL_TABLET | Freq: Once | ORAL | Status: AC
  Administered 2024-04-09: 40 mg via ORAL
  Filled 2024-04-09: qty 2

## 2024-04-09 MED ORDER — HYDROCODONE-ACETAMINOPHEN 5-325 MG PO TABS: 1.0000 | ORAL_TABLET | Freq: Four times a day (QID) | ORAL | 0 refills | Status: DC | PRN

## 2024-04-09 MED ORDER — OXYCODONE-ACETAMINOPHEN 5-325 MG PO TABS
2.0000 | ORAL_TABLET | Freq: Once | ORAL | Status: AC
  Administered 2024-04-09: 2 via ORAL
  Filled 2024-04-09: qty 2

## 2024-04-09 NOTE — ED Provider Notes (Signed)
 [facility] [facility] AT [facility] Provider Note   CSN: [id number] Arrival date & time: 04/08/24  2111     Patient presents with: No chief complaint on file.   Jacob Foster is a 58 y.o. male.   Patient is a 58 year old male with past medical history of hyperlipidemia, GERD, hypertension.  Patient presenting today with complaints of low back pain.  He describes a several week history of pain to his left lower back.  Pain is constant, but worse when he attempts to lie down to sleep, bend, or move.  He denies any radiation into his legs.  No bowel or bladder complaints.       Prior to Admission medications   Medication Sig Start Date End Date Taking? Authorizing Provider  carbidopa -levodopa  (SINEMET  CR) 50-200 MG tablet Take 1 tablet by mouth 3 (three) times daily. 11/26/23   [provider]  clonazePAM (KLONOPIN) 0.5 MG tablet Take 0.5 mg by mouth at bedtime. 11/26/23   [provider]  hydrochlorothiazide  (HYDRODIURIL ) 25 MG tablet Take 1 tablet (25 mg total) by mouth daily. TAKE 1 TABLET(25 MG) BY MOUTH DAILY 10/16/23   Jerrell Cleatus Ned, MD  pramipexole (MIRAPEX) 0.25 MG tablet Take by mouth. 11/26/23   [provider]  rosuvastatin  (CRESTOR ) 10 MG tablet Take 1 tablet (10 mg total) by mouth daily. 10/22/23 01/20/24  Jerrell Cleatus Ned, MD    Allergies: Patient has no known allergies.    Review of Systems  All other systems reviewed and are negative.   Updated Vital Signs BP (!) 152/100   Pulse 70   Temp 98.3 F (36.8 C) (Oral)   Resp 18   Ht 5' 8 (1.727 m)   Wt 83.9 kg   SpO2 98%   BMI 28.13 kg/m   Physical Exam Vitals and nursing note reviewed.  Constitutional:      General: He is not in acute distress.    Appearance: He is well-developed. He is not diaphoretic.  HENT:     Head: Normocephalic and atraumatic.  Cardiovascular:     Heart sounds: No murmur heard.    No friction rub.  Pulmonary:     Effort:  Pulmonary effort is normal.  Musculoskeletal:        General: Normal range of motion.     Cervical back: Normal range of motion and neck supple.     Comments: There is tenderness to palpation in the soft tissues of the left lower lumbar region.  Skin:    General: Skin is warm and dry.  Neurological:     Mental Status: He is alert and oriented to person, place, and time.     Coordination: Coordination normal.     Comments: DTRs are 2+ and symmetrical in the patellar and Achilles tendons bilaterally.  Strength is 5 out of 5 in both lower extremities.  He is able to ambulate on his heels and toes.     (all labs ordered are listed, but only abnormal results are displayed) Labs Reviewed - No data to display  EKG: None  Radiology: No results found.   Procedures   Medications Ordered in the ED  ketorolac (TORADOL) injection 60 mg (has no administration in time range)  oxyCODONE-acetaminophen  (PERCOCET/ROXICET) 5-325 MG per tablet 2 tablet (has no administration in time range)  predniSONE  (DELTASONE ) tablet 40 mg (has no administration in time range)  Medical Decision Making Risk Prescription drug management.   Patient is a 58 year old male presenting with low back pain as described in the HPI.  He arrives here with stable vital signs and is afebrile.  Physical examination reveals tenderness in the soft tissues of the left lower lumbar region, but strength and reflexes are symmetrical.  Urinalysis is clear.  Patient has been given IM Toradol along with prednisone  and Percocet and seems to be feeling better.  He will be discharged with prednisone  and hydrocodone and as needed return.  There are no red flags in the history or exam that would suggest an emergent situation such as bowel or bladder incontinence or asymmetrical reflexes or strength.     Final diagnoses:  None    ED Discharge Orders     None          Geroldine Berg,  MD 04/09/24 [phone]

## 2024-04-09 NOTE — Discharge Instructions (Signed)
 Begin taking prednisone  as prescribed.  Begin taking hydrocodone as prescribed as needed for pain.  Follow-up with primary doctor if symptoms are not improving in the next week to discuss imaging studies or physical therapy.  Return to the ER if symptoms significantly worsen or change in the meantime.

## 2024-04-11 ENCOUNTER — Ambulatory Visit (INDEPENDENT_AMBULATORY_CARE_PROVIDER_SITE_OTHER): Payer: Medicare (Managed Care) | Admitting: Student in an Organized Health Care Education/Training Program

## 2024-04-11 ENCOUNTER — Encounter: Payer: Self-pay | Admitting: Student in an Organized Health Care Education/Training Program

## 2024-04-11 VITALS — BP 131/88 | HR 66 | Wt 185.0 lb

## 2024-04-11 DIAGNOSIS — K5909 Other constipation: Secondary | ICD-10-CM | POA: Diagnosis not present

## 2024-04-11 DIAGNOSIS — M545 Low back pain, unspecified: Secondary | ICD-10-CM | POA: Diagnosis not present

## 2024-04-11 MED ORDER — SENNA-DOCUSATE SODIUM 8.6-50 MG PO TABS: 1.0000 | ORAL_TABLET | Freq: Every day | ORAL | 1 refills | Status: AC

## 2024-04-11 MED ORDER — LUBIPROSTONE 24 MCG PO CAPS: 24.0000 ug | ORAL_CAPSULE | Freq: Two times a day (BID) | ORAL | 1 refills | Status: DC

## 2024-04-11 NOTE — Progress Notes (Signed)
 Acute Office Visit  Subjective:     Patient ID: Jacob Foster, male    DOB: 01-07-1966, 58 y.o.   MRN: [id number]  Chief Complaint  Patient presents with   Back Pain    Back pain going on for a couple of weeks now. Lower left side back pain continues.      HPI  Discussed the use of AI scribe software for clinical note transcription with the patient, who gave verbal consent to proceed.  History of Present Illness Jacob Foster is a 58 year old male with Parkinson's disease who presents with severe constipation and back pain.  He has experienced severe constipation for the past week and a half, with no bowel movements during this period. He feels a sensation of fullness and has difficulty releasing gas. Typical remedies such as prune juice and Metamucil have been ineffective. An attempt to use Miralax resulted in nausea and vomiting. He has a history of constipation associated with Parkinson's disease, which has been a long-standing issue.  He reports back pain for about a month, primarily located on the lower left side. The pain has not radiated to his legs, and there is no numbness, tingling, or changes in leg strength. The pain has remained constant over the past month, with some relief when lying in bed. A recent visit to the emergency department resulted in prescriptions for anti-inflammatories, steroids, and hydrocodone, none of which alleviated the pain.  He experiences chills and a sensation of faintness. No recent falls or fever. He does report chills and a sensation of faintness. He has issues with urinary control, describing a continuous drip that he cannot stop, but no pain during urination. His urine flow has been consistent, and there have been no changes in strength or reflexes in his legs.  His current medications include Klonopin and HCTZ. He was recently prescribed hydrocodone and steroids, which have not provided relief.      Objective:    BP 131/88   Pulse 66   Wt  185 lb (83.9 kg)   SpO2 98%   BMI 28.13 kg/m   Physical Exam  Gen: Well appearing man Neuro: Flat affect due to masked face, resting tremor in his left hand, wide-based gait, slow get up and go, full strength upper and lower extremities, normal patellar reflexes, normal sensation in both of his lower extremities. Abd: Moderately tense, moderately distended, tympanic to percussion throughout, nontender, no hernia Ext: Warm, no edema      Assessment & Plan:    Problem List Items Addressed This Visit       Unprioritized   Chronic constipation - Primary   Chronic constipation, worsened by Parkinson's disease, is causing abdominal distension and discomfort. Miralax was ineffective and caused nausea. Constipation likely contributes to low back pain. Prescribed Amitiza, one tablet twice daily, and combine with Senna. Discontinue hydrocodone and prednisone . Advised over-the-counter enemas if no bowel movement occurs in 1-2 days. Reassess bowel function and consider alternatives if no improvement.      Relevant Medications   lubiprostone (AMITIZA) 24 MCG capsule   sennosides-docusate sodium (SENOKOT-S) 8.6-50 MG tablet   Low back pain   Low back pain is likely secondary to severe constipation.  No high risk features of the back pain today, no falls, no fevers, no weakness or paresthesias in the legs.  He is having bowel and bladder changes but not incontinence, rather he is having severe constipation related to Parkinson's.  Pain management with anti-inflammatories, steroids, and hydrocodone  was ineffective. Focus on treating constipation to alleviate back pain. Reassess back pain after addressing constipation.  Discontinue prednisone  and hydrocodone.        Meds ordered this encounter  Medications   lubiprostone (AMITIZA) 24 MCG capsule    Sig: Take 1 capsule (24 mcg total) by mouth 2 (two) times daily with a meal.    Dispense:  60 capsule    Refill:  1   sennosides-docusate sodium  (SENOKOT-S) 8.6-50 MG tablet    Sig: Take 1 tablet by mouth daily.    Dispense:  30 tablet    Refill:  1    Return if symptoms worsen or fail to improve.  Cleatus Debby Specking, MD

## 2024-04-11 NOTE — Assessment & Plan Note (Signed)
 Low back pain is likely secondary to severe constipation.  No high risk features of the back pain today, no falls, no fevers, no weakness or paresthesias in the legs.  He is having bowel and bladder changes but not incontinence, rather he is having severe constipation related to Parkinson's.  Pain management with anti-inflammatories, steroids, and hydrocodone was ineffective. Focus on treating constipation to alleviate back pain. Reassess back pain after addressing constipation.  Discontinue prednisone  and hydrocodone.

## 2024-04-11 NOTE — Assessment & Plan Note (Signed)
 Chronic constipation, worsened by Parkinson's disease, is causing abdominal distension and discomfort. Miralax was ineffective and caused nausea. Constipation likely contributes to low back pain. Prescribed Amitiza, one tablet twice daily, and combine with Senna. Discontinue hydrocodone and prednisone . Advised over-the-counter enemas if no bowel movement occurs in 1-2 days. Reassess bowel function and consider alternatives if no improvement.

## 2024-04-11 NOTE — Patient Instructions (Signed)
  VISIT SUMMARY: Today, we discussed your severe constipation and back pain. Your constipation has been ongoing for a week and a half, and typical remedies have not been effective. You also reported back pain for about a month, which has not improved with previous treatments. We reviewed your symptoms and medications, and we have made some changes to your treatment plan.  YOUR PLAN: -CONSTIPATION ASSOCIATED WITH PARKINSON'S DISEASE: Constipation is a common issue in Parkinson's disease and can cause abdominal discomfort and distension. We will start you on Amitiza, one tablet twice daily, and combine it with Senna to help relieve your constipation. Please discontinue the use of hydrocodone and prednisone . If you do not have a bowel movement by the end of the week, you may use over-the-counter enemas. We will reassess your bowel function and consider other options if there is no improvement.  -LOW BACK PAIN LIKELY SECONDARY TO CONSTIPATION: Your low back pain is likely due to severe constipation. Since previous pain management strategies were ineffective, we will focus on treating your constipation first. We will reassess your back pain after your constipation is addressed.  INSTRUCTIONS: Please follow the new medication plan: take Amitiza one tablet twice daily and combine it with Senna. Discontinue hydrocodone and prednisone . If you do not have a bowel movement by the end of the week, use over-the-counter enemas. We will reassess your condition in a follow-up visit.

## 2024-04-13 ENCOUNTER — Other Ambulatory Visit: Payer: Self-pay | Admitting: Student in an Organized Health Care Education/Training Program

## 2024-04-13 ENCOUNTER — Ambulatory Visit: Payer: Self-pay

## 2024-04-13 DIAGNOSIS — K219 Gastro-esophageal reflux disease without esophagitis: Secondary | ICD-10-CM

## 2024-04-13 NOTE — Telephone Encounter (Signed)
 FYI Only or Action Required?: FYI only for provider.  Patient was last seen in primary care on 04/11/2024 by Jerrell Cleatus Ned, MD.  Called Nurse Triage reporting Back Pain.  Symptoms began several weeks ago.  Interventions attempted: OTC medications: tylenol , steroids, hydrocodone.  Symptoms are: unchanged.  Triage Disposition: See PCP When Office is Open (Within 3 Days)  Patient/caregiver understands and will follow disposition?: Yes    Copied from CRM #[id number]. Topic: Clinical - Red Word Triage >> Apr 13, 2024  8:44 AM Carlyon D wrote: Red Word that prompted transfer to Nurse Triage: Pt wife Alvin is stating husband is experiencing severe back pain. The pain is all to the bottom left. Pt is also having the sweats. Reason for Disposition  [1] MODERATE back pain (e.g., interferes with normal activities) AND [2] present > 3 days  Answer Assessment - Initial Assessment Questions 1. ONSET: When did the pain begin? (e.g., minutes, hours, days)     2 weeks ago, within the last week he started feeling increased pain that affected his walking 2. LOCATION: Where does it hurt? (upper, mid or lower back)     Lower left back 3. SEVERITY: How bad is the pain?  (e.g., Scale 1-10; mild, moderate, or severe)     severe 4. PATTERN: Is the pain constant? (e.g., yes, no; constant, intermittent)      Comes and goes 5. RADIATION: Does the pain shoot into your legs or somewhere else?     denies 6. CAUSE:  What do you think is causing the back pain?      unknown 7. BACK OVERUSE:  Any recent lifting of heavy objects, strenuous work or exercise?     denies 8. MEDICINES: What have you taken so far for the pain? (e.g., nothing, acetaminophen , NSAIDS)     Tylenol  and hydrocodone, and steroids; was seen in ER on Friday night.  9. NEUROLOGIC SYMPTOMS: Do you have any weakness, numbness, or problems with bowel/bladder control?     States bladder is not strong 10. OTHER SYMPTOMS: Do  you have any other symptoms? (e.g., fever, abdomen pain, burning with urination, blood in urine)       no 11. PREGNANCY: Is there any chance you are pregnant? When was your last menstrual period?       na  Protocols used: Back Pain-A-AH

## 2024-04-14 ENCOUNTER — Ambulatory Visit: Payer: Medicare (Managed Care) | Admitting: Student in an Organized Health Care Education/Training Program

## 2024-04-20 ENCOUNTER — Encounter: Payer: Self-pay | Admitting: Student in an Organized Health Care Education/Training Program

## 2024-04-20 ENCOUNTER — Ambulatory Visit: Payer: Medicare (Managed Care) | Admitting: Student in an Organized Health Care Education/Training Program

## 2024-04-20 VITALS — BP 156/95 | HR 85 | Wt 190.0 lb

## 2024-04-20 DIAGNOSIS — I10 Essential (primary) hypertension: Secondary | ICD-10-CM | POA: Diagnosis not present

## 2024-04-20 DIAGNOSIS — K5909 Other constipation: Secondary | ICD-10-CM

## 2024-04-20 DIAGNOSIS — G20A1 Parkinson's disease without dyskinesia, without mention of fluctuations: Secondary | ICD-10-CM | POA: Diagnosis not present

## 2024-04-20 DIAGNOSIS — E785 Hyperlipidemia, unspecified: Secondary | ICD-10-CM | POA: Diagnosis not present

## 2024-04-20 DIAGNOSIS — D751 Secondary polycythemia: Secondary | ICD-10-CM | POA: Diagnosis not present

## 2024-04-20 DIAGNOSIS — G4733 Obstructive sleep apnea (adult) (pediatric): Secondary | ICD-10-CM

## 2024-04-20 DIAGNOSIS — Z23 Encounter for immunization: Secondary | ICD-10-CM

## 2024-04-20 DIAGNOSIS — M545 Low back pain, unspecified: Secondary | ICD-10-CM | POA: Diagnosis not present

## 2024-04-20 LAB — MICROALBUMIN / CREATININE URINE RATIO
Creatinine,U: 150 mg/dL
Microalb Creat Ratio: 19.9 mg/g (ref 0.0–30.0)
Microalb, Ur: 3 mg/dL — ABNORMAL HIGH (ref 0.0–1.9)

## 2024-04-20 LAB — LIPID PANEL
Cholesterol: 157 mg/dL (ref 0–200)
HDL: 50.6 mg/dL (ref >39.00)
LDL Cholesterol: 88 mg/dL (ref 0–99)
NonHDL: 106.13
Total CHOL/HDL Ratio: 3
Triglycerides: 93 mg/dL (ref 0.0–149.0)
VLDL: 18.6 mg/dL (ref 0.0–40.0)

## 2024-04-20 LAB — BASIC METABOLIC PANEL WITH GFR
BUN: 20 mg/dL (ref 6–23)
CO2: 32 meq/L (ref 19–32)
Calcium: 9.7 mg/dL (ref 8.4–10.5)
Chloride: 99 meq/L (ref 96–112)
Creatinine, Ser: 0.99 mg/dL (ref 0.40–1.50)
GFR: 85.42 mL/min (ref >60.00)
Glucose, Bld: 87 mg/dL (ref 70–99)
Potassium: 3.5 meq/L (ref 3.5–5.1)
Sodium: 139 meq/L (ref 135–145)

## 2024-04-20 LAB — CBC
HCT: 54.8 % — ABNORMAL HIGH (ref 39.0–52.0)
Hemoglobin: 17.7 g/dL — ABNORMAL HIGH (ref 13.0–17.0)
MCHC: 32.4 g/dL (ref 30.0–36.0)
MCV: 87.8 fl (ref 78.0–100.0)
Platelets: 236 K/uL (ref 150.0–400.0)
RBC: 6.23 Mil/uL — ABNORMAL HIGH (ref 4.22–5.81)
RDW: 14.6 % (ref 11.5–15.5)
WBC: 12.3 K/uL — ABNORMAL HIGH (ref 4.0–10.5)

## 2024-04-20 MED ORDER — AMLODIPINE BESYLATE-VALSARTAN 5-160 MG PO TABS: 1.0000 | ORAL_TABLET | Freq: Every day | ORAL | 2 refills | Status: DC

## 2024-04-20 MED ORDER — HYDROCHLOROTHIAZIDE 25 MG PO TABS: 25.0000 mg | ORAL_TABLET | Freq: Every day | ORAL | Status: AC

## 2024-04-20 NOTE — Addendum Note (Signed)
 Addended by: RANDINE HOVE R on: 04/20/2024 10:00 AM   Modules accepted: Orders

## 2024-04-20 NOTE — Assessment & Plan Note (Signed)
 His low back pain resolved spontaneously since last Friday.  I think is likely related to acute on chronic constipation.

## 2024-04-20 NOTE — Patient Instructions (Signed)
  VISIT SUMMARY: Today, we discussed your ongoing issues with Parkinson's disease, constipation, sleep apnea, hypertension, and low back pain. We reviewed your current medications and made some adjustments to better manage your symptoms.  YOUR PLAN: -PARKINSON'S DISEASE: Parkinson's disease is a disorder of the nervous system that affects movement, causing tremors and stiffness. You should continue taking Sinemet  and clonazepam as prescribed. Please discuss the possibility of deep brain stimulation surgery with your neurologist and follow up with them in December.  -CHRONIC CONSTIPATION IN PARKINSON'S DISEASE: Constipation is a common issue in Parkinson's disease due to slower movement of the digestive tract. Continue taking Amitiza once daily, and increase to twice daily if needed. Do not go more than a day without a bowel movement; if it reaches two days, double up on your medications.  -OBSTRUCTIVE SLEEP APNEA: Obstructive sleep apnea is a condition where your breathing stops and starts during sleep, leading to poor sleep quality and other health issues. Follow up with your neurologist to discuss reordering your CPAP machine and managing your sleep apnea.  -HYPERTENSION: Hypertension, or high blood pressure, can be worsened by sleep apnea. Continue taking hydrochlorothiazide  and add Exforge (amlodipine  and valsartan) once daily. Recheck your blood pressure in four weeks. Blood work is ordered to check your kidney function, electrolytes, blood counts, and cholesterol.  -LOW BACK PAIN: Your low back pain has resolved spontaneously since last Friday. No further action is needed at this time.  -GENERAL HEALTH MAINTENANCE: Blood work is ordered to check your kidney function, electrolytes, blood counts, and cholesterol. Continue following a healthy lifestyle, including a balanced diet and regular exercise.  INSTRUCTIONS: Please follow up with your neurologist in December to discuss deep brain  stimulation surgery and to manage your sleep apnea. Recheck your blood pressure in four weeks. Blood work has been ordered to check your kidney function, electrolytes, blood counts, and cholesterol.

## 2024-04-20 NOTE — Progress Notes (Signed)
 Established Patient Office Visit  Subjective   Patient ID: Jacob Foster, male    DOB: 1966-03-17  Age: 58 y.o. MRN: [id number]  Chief Complaint  Patient presents with   Hyperlipidemia    3 month recheck   Flu-received in [place]  [facility]-patient is interested in vaccine  Shingles-would like more information      HPI  Discussed the use of AI scribe software for clinical note transcription with the patient, who gave verbal consent to proceed.  History of Present Illness Jacob Foster is a 58 year old male with Parkinson's disease who presents with constipation and sleep apnea issues.  He experiences constipation, which he initially attributed to his lower back pain. The sensation is described as soreness in the area where he attempts to use the bathroom, making bowel movements difficult. He uses Amitiza once a day, prescribed for his constipation, and regularly consumes prune juice. He has bowel movements every other day, an improvement from before, and no longer feels constipated.  He has Parkinson's disease, causing significant tremors on his left side. He has not experienced any falls recently, partly due to taking it easy because of his back pain. He continues to use Sinemet  for his Parkinson's symptoms and takes clonazepam for sleep, having reduced the dose to one pill a day.  He has sleep apnea and previously used a CPAP machine, which was taken back due to insufficient usage. He has difficulty sleeping, waking up at midnight and struggling to return to sleep. He has had elevated blood counts and has been told this may be related to his sleep apnea.  He is currently taking hydrochlorothiazide  for hypertension.    Objective:     BP (!) 156/95   Pulse 85   Wt 190 lb (86.2 kg)   SpO2 100%   BMI 28.89 kg/m    Physical Exam  Gen: Well-appearing man Neuro: Resting tremor mostly affecting the left upper extremity and left lower extremity.  Masked face.  Mildly delayed get  up and go, wide-based gait.  Full strength upper and lower extremities. Neck: Normal thyroid , no nodules or adenopathy Heart: Regular, no murmur Lungs: Unlabored, clear throughout Abd; much softer compared to last week, midline ventral hernia is soft and easily reducible, no tenderness Ext: Warm, no edema    Assessment & Plan:    Problem List Items Addressed This Visit       High   Parkinson's disease (HCC) (Chronic)   He experiences persistent left-sided tremors and is considering deep brain stimulation surgery due to stress and discomfort. Current medications include Sinemet  and clonazepam. He should discuss deep brain stimulation surgery with his neurologist, continue his current medications, and follow up with the neurologist in December.      Obstructive sleep apnea (Chronic)   He has obstructive sleep apnea with previous CPAP use, contributing to elevated blood counts and nocturnal hypoxia. He should follow up with his neurologist to discuss reordering CPAP and management of sleep apnea.  It will be difficult to manage the polycythemia and hypertension if the obstructive sleep apnea is untreated.        Medium    Essential hypertension - Primary (Chronic)   Hypertension may be exacerbated by sleep apnea. He is currently on hydrochlorothiazide , but his blood pressure is not at goal. He should continue hydrochlorothiazide  and add Exforge (amlodipine  and valsartan) once daily. Blood pressure should be rechecked in four weeks. Blood work is ordered to check kidney function, electrolytes, blood counts, and cholesterol.  Relevant Medications   hydrochlorothiazide  (HYDRODIURIL ) 25 MG tablet   amLODipine -valsartan (EXFORGE) 5-160 MG tablet   Other Relevant Orders   Basic metabolic panel with GFR   Microalbumin / creatinine urine ratio   Erythrocytosis (Chronic)   Relevant Orders   CBC   Hyperlipidemia (Chronic)   Relevant Medications   hydrochlorothiazide  (HYDRODIURIL ) 25 MG  tablet   amLODipine -valsartan (EXFORGE) 5-160 MG tablet   Other Relevant Orders   Lipid panel     Low   Chronic constipation (Chronic)   Chronic constipation has improved with Amitiza, resulting in bowel movements every other day. He should continue Amitiza once daily and increase to twice daily if needed. He is advised not to go more than a day without a bowel movement; if it reaches two days, he should double up on medications.        Unprioritized   Low back pain   His low back pain resolved spontaneously since last Friday.  I think is likely related to acute on chronic constipation.       Return in about 4 weeks (around 05/18/2024) for HTN management.    Jacob Debby Specking, MD

## 2024-04-20 NOTE — Assessment & Plan Note (Signed)
 Hypertension may be exacerbated by sleep apnea. He is currently on hydrochlorothiazide , but his blood pressure is not at goal. He should continue hydrochlorothiazide  and add Exforge (amlodipine  and valsartan) once daily. Blood pressure should be rechecked in four weeks. Blood work is ordered to check kidney function, electrolytes, blood counts, and cholesterol.

## 2024-04-20 NOTE — Assessment & Plan Note (Addendum)
 He has obstructive sleep apnea with previous CPAP use, contributing to elevated blood counts and nocturnal hypoxia. He should follow up with his neurologist to discuss reordering CPAP and management of sleep apnea.  It will be difficult to manage the polycythemia and hypertension if the obstructive sleep apnea is untreated.

## 2024-04-20 NOTE — Assessment & Plan Note (Signed)
 He experiences persistent left-sided tremors and is considering deep brain stimulation surgery due to stress and discomfort. Current medications include Sinemet  and clonazepam. He should discuss deep brain stimulation surgery with his neurologist, continue his current medications, and follow up with the neurologist in December.

## 2024-04-20 NOTE — Assessment & Plan Note (Signed)
 Chronic constipation has improved with Amitiza, resulting in bowel movements every other day. He should continue Amitiza once daily and increase to twice daily if needed. He is advised not to go more than a day without a bowel movement; if it reaches two days, he should double up on medications.

## 2024-04-21 ENCOUNTER — Ambulatory Visit: Payer: Self-pay | Admitting: Student in an Organized Health Care Education/Training Program

## 2024-04-21 DIAGNOSIS — I10 Essential (primary) hypertension: Secondary | ICD-10-CM

## 2024-04-26 ENCOUNTER — Telehealth: Payer: Self-pay

## 2024-04-26 NOTE — Telephone Encounter (Signed)
 Reason for referral was already sent to PCP for review  Copied from CRM #[id number]. Topic: General - Other >> Apr 26, 2024  2:33 PM Pinkey ORN wrote: Reason for CRM: Returning Office Call >> Apr 26, 2024  2:35 PM Pinkey ORN wrote: Patient's wife Alvin is returning a office call to [facility], [place]  Please return call to [phone]

## 2024-04-26 NOTE — Telephone Encounter (Signed)
 Called patient and left vm to return call. Need to know why patient wants a referral to a dietician

## 2024-04-26 NOTE — Telephone Encounter (Signed)
 Copied from CRM [id number]. Topic: Referral - Question >> Apr 26, 2024  1:28 PM Mesmerise C wrote: Reason for CRM: Patient's wife Jacob Foster stated has a appt with Neurologist at his appt discussed his blood count being slightly elevated, wanting a referral for a dietician has a list that are covered by his insurance inquiring if needs to check which one they want to go with or discuss with provider first

## 2024-04-26 NOTE — Telephone Encounter (Signed)
 Patient is asking referral to control eating habits and BP.   Copied from CRM [id number]. Topic: General - Other >> Apr 26, 2024  1:40 PM Jacob Foster wrote: Reason for CRM: Patient returned call to [facility] regarding referral to dietician. He stated that he needs help with his eating habits to help control his blood pressure.

## 2024-04-27 NOTE — Telephone Encounter (Signed)
 Called patients wife and left vm to return call. Wanted to relay Dr. Gaylin message

## 2024-04-27 NOTE — Telephone Encounter (Signed)
 Copied from CRM [id number]. Topic: General - Inquiry >> Apr 27, 2024  9:48 AM Laymon HERO wrote: Reason for CRM: Patient wife asking for [facility], CMA to call her back. I relayed message from Dr Jerrell and she has numerous questions and concerns Please contact as soon as possible.

## 2024-04-27 NOTE — Telephone Encounter (Signed)
 Called patients wife back. She said that it is a covered service. A NP with medicare sent them a list of covered providers. Patients wife will send to us  in MyChart message. Wife is wondering if RBC and WBC levels are from potential sleep apnea? He waiting upon approval for sleep study.

## 2024-04-27 NOTE — Telephone Encounter (Unsigned)
 Copied from CRM #[id number]. Topic: General - Other >> Apr 27, 2024  2:11 PM Pinkey ORN wrote: Reason for CRM: Returning Missed Call >> Apr 27, 2024  2:14 PM Pinkey ORN wrote: Patient's wife Alvin is returning a missed office call from Honor Alfredo LABOR, CMA

## 2024-04-27 NOTE — Telephone Encounter (Signed)
 Please see message below. Patients plan does cover. She has attached a list of providers they can see. Thank you

## 2024-04-27 NOTE — Telephone Encounter (Signed)
 Under standard medicare rules, this would not be a covered service via Medicare's MNT benefit, because the patient does not have diabetes for kidney disease.  But the patient's Medicare Advantage plan might offer additional wellness or nutrition counseling benefits beyond the baseline. I recommend the patient check with the plan to see if dietitian/nutrition counseling is included under "extra benefits" or wellness services.

## 2024-05-03 ENCOUNTER — Other Ambulatory Visit: Payer: Self-pay | Admitting: Student in an Organized Health Care Education/Training Program

## 2024-05-03 DIAGNOSIS — K5909 Other constipation: Secondary | ICD-10-CM

## 2024-05-03 NOTE — Telephone Encounter (Signed)
 Okay to switch to 90D supply?

## 2024-05-03 NOTE — Telephone Encounter (Signed)
Okay for 90 day prescription

## 2024-05-18 ENCOUNTER — Encounter: Payer: Self-pay | Admitting: Student in an Organized Health Care Education/Training Program

## 2024-05-18 ENCOUNTER — Ambulatory Visit (INDEPENDENT_AMBULATORY_CARE_PROVIDER_SITE_OTHER): Payer: Medicare (Managed Care) | Admitting: Student in an Organized Health Care Education/Training Program

## 2024-05-18 VITALS — BP 131/82 | HR 81 | Wt 192.0 lb

## 2024-05-18 DIAGNOSIS — D751 Secondary polycythemia: Secondary | ICD-10-CM | POA: Diagnosis not present

## 2024-05-18 DIAGNOSIS — G4733 Obstructive sleep apnea (adult) (pediatric): Secondary | ICD-10-CM | POA: Diagnosis not present

## 2024-05-18 DIAGNOSIS — I10 Essential (primary) hypertension: Secondary | ICD-10-CM

## 2024-05-18 NOTE — Assessment & Plan Note (Signed)
 Blood pressure is 152/109 mmHg. Current treatment includes hydrochlorothiazide  and carvedilol , but carvedilol  is ineffective for hypertension. Amlodipine patsie was prescribed last month but not started. Hypertension management was discussed to reduce cardiovascular risk. Discontinued carvedilol  and started amlodipine /valsartan with hydrochlorothiazide . Follow-up in one month to assess blood pressure and medication efficacy.

## 2024-05-18 NOTE — Assessment & Plan Note (Signed)
 Awaiting approval for sleep study equipment. Previous issues with CPAP mask adherence. Considering a dental appliance. Await call from sleep study specialist for equipment pickup.  Patient is planning to consult with dentist regarding dental appliance for sleep apnea.

## 2024-05-18 NOTE — Progress Notes (Signed)
 Established Patient Office Visit  Patient ID: Jacob Foster, male    DOB: September 26, 1965  Age: 58 y.o. MRN: [id number] PCP: Jerrell Cleatus Ned, MD  Chief Complaint  Patient presents with   Hypertension    4 week follow up     Subjective:     HPI  Discussed the use of AI scribe software for clinical note transcription with the patient, who gave verbal consent to proceed.  History of Present Illness Jacob Foster is a 58 year old male with Parkinson's disease and hypertension who presents for follow-up on blood pressure management and sleep apnea treatment. He is accompanied by his wife, Tobias, who assists with his medical management.  He is experiencing difficulty adjusting to the CPAP mask for his sleep apnea, finding it uncomfortable to wear for the required duration. He is awaiting a new sleep study appointment. Insurance has approved the repeat sleep study.  He has elevated blood counts, with a hemoglobin level of 17 noted in recent tests. He is scheduled to see a hematologist to further evaluate these findings.  For hypertension, he is taking hydrochlorothiazide  and carvedilol , although the latter was prescribed by an emergency department physician. He has not started the amlodipine /valsartan prescribed last month. His blood pressure readings have been elevated, with a recent measurement of 152/109 mmHg. Tobias is cautious about adding new medications and is interested in exploring natural methods to manage his blood pressure.  He takes his medications as prescribed, including hydrochlorothiazide  and carvedilol , with a regimen of three pills in the morning and one in the evening. He also takes clobetasol twice a day. He has not filled the amlodipine /valsartan prescription yet.  He has a good breakfast and is able to perform some physical activity, such as running a quarter of a mile and walking three-quarters of a mile.     Objective:     BP 131/82   Pulse 81   Wt 192 lb (87.1  kg)   SpO2 100%   BMI 29.19 kg/m   Physical Exam  Gen: Well-appearing man Neuro: Left sided resting pill rolling tremor, moderately delayed get up and go, wide-based shuffling gait with limited armswing Neck: Mildly enlarged thyroid , no nodules, no adenopathy Heart: Regular, no murmur Lungs: Unlabored, clear throughout Ext: Warm, no edema    Assessment & Plan:   Problem List Items Addressed This Visit       High   Obstructive sleep apnea - Primary (Chronic)   Awaiting approval for sleep study equipment. Previous issues with CPAP mask adherence. Considering a dental appliance. Await call from sleep study specialist for equipment pickup.  Patient is planning to consult with dentist regarding dental appliance for sleep apnea.        Medium    Essential hypertension (Chronic)   Blood pressure is 152/109 mmHg. Current treatment includes hydrochlorothiazide  and carvedilol , but carvedilol  is ineffective for hypertension. Amlodipine patsie was prescribed last month but not started. Hypertension management was discussed to reduce cardiovascular risk. Discontinued carvedilol  and started amlodipine /valsartan with hydrochlorothiazide . Follow-up in one month to assess blood pressure and medication efficacy.       Erythrocytosis (Chronic)   Persistent erythrocytosis with hemoglobin at 17 g/dL. Previous JAK2 testing was normal.  I think it is most likely due to untreated sleep apnea in the setting of Parkinson's disease.  He has no history of underlying lung disease, renal disease, or TRT use.  Patient has sought second opinion and has an appointment for hematology tomorrow.  Return in about 4 weeks (around 06/15/2024).    Cleatus Debby Specking, MD [facility] at [facility]

## 2024-05-18 NOTE — Assessment & Plan Note (Signed)
 Persistent erythrocytosis with hemoglobin at 17 g/dL. Previous JAK2 testing was normal.  I think it is most likely due to untreated sleep apnea in the setting of Parkinson's disease.  He has no history of underlying lung disease, renal disease, or TRT use.  Patient has sought second opinion and has an appointment for hematology tomorrow.

## 2024-05-18 NOTE — Patient Instructions (Signed)
  VISIT SUMMARY: Today, we discussed your blood pressure management, sleep apnea treatment, and elevated blood counts. We made some changes to your medications and provided guidance on next steps for your ongoing care.  YOUR PLAN: -ESSENTIAL HYPERTENSION: Essential hypertension means high blood pressure without a known cause. Your blood pressure is currently 152/109 mmHg. We have discontinued carvedilol  and started you on amlodipine /valsartan along with continuing hydrochlorothiazide . This change aims to better control your blood pressure and reduce your risk of heart problems. Please follow up in one month to check your blood pressure and see how the new medication is working.  -ERYTHROCYTOSIS: Erythrocytosis means having a higher than normal number of red blood cells. Your hemoglobin level is 17 g/dL. You have been referred to a hematologist for further evaluation, and you should attend your appointment scheduled for tomorrow.  -OBSTRUCTIVE SLEEP APNEA: Obstructive sleep apnea is a condition where your breathing stops and starts during sleep. You are having difficulty with the CPAP mask and are awaiting a new sleep study. We discussed the possibility of using a dental appliance as an alternative. Please await a call from the sleep study specialist for equipment pickup and consult with your dentist about the dental appliance.  INSTRUCTIONS: Please follow up in one month to assess your blood pressure and the effectiveness of the new medication. Attend your hematologist appointment scheduled for tomorrow. Await a call from the sleep study specialist for equipment pickup and consult with your dentist regarding a dental appliance for sleep apnea.

## 2024-06-15 ENCOUNTER — Encounter: Payer: Self-pay | Admitting: Student in an Organized Health Care Education/Training Program

## 2024-06-15 ENCOUNTER — Ambulatory Visit: Payer: Medicare (Managed Care) | Admitting: Student in an Organized Health Care Education/Training Program

## 2024-06-15 VITALS — BP 138/88 | HR 87 | Wt 192.0 lb

## 2024-06-15 DIAGNOSIS — G4733 Obstructive sleep apnea (adult) (pediatric): Secondary | ICD-10-CM | POA: Diagnosis not present

## 2024-06-15 DIAGNOSIS — I10 Essential (primary) hypertension: Secondary | ICD-10-CM

## 2024-06-15 DIAGNOSIS — G20A1 Parkinson's disease without dyskinesia, without mention of fluctuations: Secondary | ICD-10-CM

## 2024-06-15 MED ORDER — LOSARTAN POTASSIUM 50 MG PO TABS: 50.0000 mg | ORAL_TABLET | Freq: Every day | ORAL | 1 refills | Status: AC

## 2024-06-15 NOTE — Patient Instructions (Signed)
  VISIT SUMMARY: Today, we discussed your ongoing issues with Parkinson's disease, hypertension, and sleep disturbances. We reviewed your current medications and made some adjustments to better manage your conditions. We also planned further evaluations to address your sleep concerns.  YOUR PLAN: -PARKINSON'S DISEASE: Parkinson's disease is a disorder of the nervous system that affects movement, causing tremors, stiffness, and difficulty with balance and coordination. Your current medications, carbidopa -levodopa  and selegiline, will be continued as they provide some relief. We are considering a brain stimulator to help improve your symptoms and reduce your risk of falls. You have a consultation for the brain stimulator scheduled on December 17th.  -ESSENTIAL HYPERTENSION: Hypertension, or high blood pressure, is a condition where the force of the blood against your artery walls is too high, which can lead to heart disease and other health problems. Your blood pressure has been fluctuating, so we are starting you on losartan to replace carvedilol , while continuing hydrochlorothiazide . The goal is to keep your blood pressure under 130/80. Please monitor your blood pressure at home two to three times a week. We will follow up in six weeks to assess your blood pressure and check your blood work.  -OBSTRUCTIVE SLEEP APNEA: Obstructive sleep apnea is a condition where your breathing repeatedly stops and starts during sleep. Although your recent home sleep study was negative, we are scheduling a lab-based sleep study for a more accurate evaluation. Please proceed with the lab-based sleep study and use the prescribed sleeping pill for the study.  INSTRUCTIONS: Please monitor your blood pressure at home two to three times a week and record the readings. Attend your brain stimulator consultation on December 17th. Proceed with the lab-based sleep study and use the prescribed sleeping pill for the study. Follow up in  six weeks to assess your blood pressure and check your blood work.

## 2024-06-15 NOTE — Assessment & Plan Note (Signed)
 Blood pressure fluctuates between 120/80 and 150/109. Current regimen includes hydrochlorothiazide  and carvedilol . We talked about the goals of managing his blood pressure, it certainly is not his high priority is managing his Parkinson's symptoms.  This is primary prevention.  Ultimately we decided to continue hydrochlorothiazide , discontinue carvedilol , and start losartan 50 mg daily.  We want to be gentle given his risk of orthostatic hypotension from Parkinson's.  Monitor blood pressure at home two to three times a week. Follow-up scheduled in six weeks to assess blood pressure and check blood work.  I think we are all on the same page now.

## 2024-06-15 NOTE — Progress Notes (Signed)
 Established Patient Office Visit  Patient ID: Jacob Foster, male    DOB: April 01, 1966  Age: 58 y.o. MRN: [id number] PCP: Jerrell Cleatus Ned, MD  Subjective:     HPI  Discussed the use of AI scribe software for clinical note transcription with the patient, who gave verbal consent to proceed.  History of Present Illness Jacob Foster is a 58 year old male with Parkinson's disease and hypertension who presents for follow-up on blood pressure management and sleep issues.  He experiences ongoing sleep disturbances, characterized by difficulty sleeping at night and frequent awakenings around midnight. He attributes these issues to his medication, specifically clozapine, which he believes interferes with his sleep. Despite taking short naps during the day, he feels these are insufficient. A recent home sleep study was negative, but he questions its accuracy due to his history of sleep apnea.  His Parkinson's disease contributes to decreased energy levels and tremors. He notes increased rigidity in his hands over time. His current medications for Parkinson's include carbidopa -levodopa  25/100 mg and selegiline, which provide some relief but are not fully effective.  For hypertension, he monitors his blood pressure at home, observing fluctuations between 150/109 and 120/80. He has not started the recommended amlodipine  and valsartan , preferring his current regimen of hydrochlorothiazide  and carvedilol , which he feels manages his blood pressure adequately. He also takes rosuvastatin  for cholesterol management.  He experiences constipation, with bowel movements every two days, an improvement from previous patterns. He maintains a good appetite and hydration, and his mood is generally positive, though he occasionally worries about the progression of his Parkinson's and its impact on his mobility. He reports experiencing two or three falls a day.     Objective:     BP 138/88   Pulse 87   Physical  Exam  Gen: Well-appearing man Neuro: masked faces, pill-rolling tremor in the left hand, wide-based shuffling gait with minimal armswing, not much cogwheel rigidity of the upper extremities Heart: Regular, no murmur Lungs: Unlabored, clear throughout Abd: Protuberant, soft, nondistended, nontender Ext: Warm, no edema     Assessment & Plan:   Problem List Items Addressed This Visit       High   Parkinson's disease (HCC) - Primary (Chronic)   He experiences tremors, gait changes, and fatigue. Current medications are partially effective. A brain stimulator is being considered to improve symptoms and reduce fall risk. He is aware of the stimulator's benefits. Continue carbidopa -levodopa  and selegiline. Proceed with a brain stimulator consultation on December 17th.      Obstructive sleep apnea (Chronic)   A recent home sleep study was negative, but results are questioned due to inadequate sleep. A lab-based sleep study is scheduled for further evaluation. Proceed with the lab-based sleep study and use the prescribed sleeping pill for the lab study.        Medium    Essential hypertension (Chronic)   Blood pressure fluctuates between 120/80 and 150/109. Current regimen includes hydrochlorothiazide  and carvedilol . We talked about the goals of managing his blood pressure, it certainly is not his high priority is managing his Parkinson's symptoms.  This is primary prevention.  Ultimately we decided to continue hydrochlorothiazide , discontinue carvedilol , and start losartan 50 mg daily.  We want to be gentle given his risk of orthostatic hypotension from Parkinson's.  Monitor blood pressure at home two to three times a week. Follow-up scheduled in six weeks to assess blood pressure and check blood work.  I think we are all on the same  page now.      Relevant Medications   losartan (COZAAR) 50 MG tablet    Return in about 6 weeks (around 07/27/2024).    Cleatus Debby Specking, MD [facility] at [facility]

## 2024-06-15 NOTE — Assessment & Plan Note (Signed)
 He experiences tremors, gait changes, and fatigue. Current medications are partially effective. A brain stimulator is being considered to improve symptoms and reduce fall risk. He is aware of the stimulator's benefits. Continue carbidopa -levodopa  and selegiline. Proceed with a brain stimulator consultation on December 17th.

## 2024-06-15 NOTE — Assessment & Plan Note (Signed)
 A recent home sleep study was negative, but results are questioned due to inadequate sleep. A lab-based sleep study is scheduled for further evaluation. Proceed with the lab-based sleep study and use the prescribed sleeping pill for the lab study.

## 2024-07-27 ENCOUNTER — Encounter: Payer: Self-pay | Admitting: Student in an Organized Health Care Education/Training Program

## 2024-07-27 ENCOUNTER — Ambulatory Visit: Payer: Medicare (Managed Care) | Admitting: Student in an Organized Health Care Education/Training Program

## 2024-07-27 VITALS — BP 130/84 | HR 69 | Temp 98.0°F | Ht 68.0 in | Wt 193.0 lb

## 2024-07-27 DIAGNOSIS — G20A1 Parkinson's disease without dyskinesia, without mention of fluctuations: Secondary | ICD-10-CM

## 2024-07-27 DIAGNOSIS — D751 Secondary polycythemia: Secondary | ICD-10-CM | POA: Diagnosis not present

## 2024-07-27 DIAGNOSIS — G4733 Obstructive sleep apnea (adult) (pediatric): Secondary | ICD-10-CM

## 2024-07-27 DIAGNOSIS — K5909 Other constipation: Secondary | ICD-10-CM

## 2024-07-27 DIAGNOSIS — M545 Low back pain, unspecified: Secondary | ICD-10-CM

## 2024-07-27 DIAGNOSIS — I10 Essential (primary) hypertension: Secondary | ICD-10-CM | POA: Diagnosis not present

## 2024-07-27 LAB — BASIC METABOLIC PANEL WITH GFR
BUN: 17 mg/dL (ref 6–23)
CO2: 31 meq/L (ref 19–32)
Calcium: 9.8 mg/dL (ref 8.4–10.5)
Chloride: 100 meq/L (ref 96–112)
Creatinine, Ser: 1.11 mg/dL (ref 0.40–1.50)
GFR: 74.32 mL/min
Glucose, Bld: 82 mg/dL (ref 70–99)
Potassium: 4 meq/L (ref 3.5–5.1)
Sodium: 137 meq/L (ref 135–145)

## 2024-07-27 LAB — CBC WITH DIFFERENTIAL/PLATELET
Basophils Absolute: 0 K/uL (ref 0.0–0.1)
Basophils Relative: 0.9 % (ref 0.0–3.0)
Eosinophils Absolute: 0.2 K/uL (ref 0.0–0.7)
Eosinophils Relative: 3 % (ref 0.0–5.0)
HCT: 52.7 % — ABNORMAL HIGH (ref 39.0–52.0)
Hemoglobin: 17.6 g/dL — ABNORMAL HIGH (ref 13.0–17.0)
Lymphocytes Relative: 47.5 % — ABNORMAL HIGH (ref 12.0–46.0)
Lymphs Abs: 2.5 K/uL (ref 0.7–4.0)
MCHC: 33.5 g/dL (ref 30.0–36.0)
MCV: 88.7 fl (ref 78.0–100.0)
Monocytes Absolute: 0.4 K/uL (ref 0.1–1.0)
Monocytes Relative: 8.3 % (ref 3.0–12.0)
Neutro Abs: 2.2 K/uL (ref 1.4–7.7)
Neutrophils Relative %: 40.3 % — ABNORMAL LOW (ref 43.0–77.0)
Platelets: 210 K/uL (ref 150.0–400.0)
RBC: 5.94 Mil/uL — ABNORMAL HIGH (ref 4.22–5.81)
RDW: 14.9 % (ref 11.5–15.5)
WBC: 5.3 K/uL (ref 4.0–10.5)

## 2024-07-27 LAB — MAGNESIUM: Magnesium: 2.3 mg/dL (ref 1.5–2.5)

## 2024-07-27 NOTE — Assessment & Plan Note (Addendum)
 Chronic issue.  Will check CBC again today.  I think this is most likely related to untreated sleep apnea.  Previously had a negative JAK2 testing.  He is also had consultation with hematology.  Should be starting CPAP therapy pretty soon, will hope to see the erythrocytosis improve in the coming months.

## 2024-07-27 NOTE — Assessment & Plan Note (Signed)
 His blood pressure is well-controlled with hydrochlorothiazide  and losartan . Continue the current antihypertensive regimen. Blood work was checked to monitor kidney function and electrolytes.

## 2024-07-27 NOTE — Assessment & Plan Note (Signed)
 His Parkinson's disease is managed by neurologist Dr. Maree, with medication adjustments made for agitation and impulse control issues. Continue the current Parkinson's medication regimen as adjusted.

## 2024-07-27 NOTE — Assessment & Plan Note (Signed)
 Severe obstructive sleep apnea was confirmed by a sleep study. He is awaiting CPAP machine approval and fitting. Continue working with the sleep specialist to obtain the CPAP machine. Encourage patience and persistence with CPAP therapy.

## 2024-07-27 NOTE — Assessment & Plan Note (Signed)
 Chronic and stable.  Currently well-controlled with lifestyle modifications, he has started increasing his fiber intake.  Also using Amitiza  daily.

## 2024-07-27 NOTE — Progress Notes (Signed)
 "  Established Patient Office Visit  Patient ID: Jacob Foster, male    DOB: 1966/03/19  Age: 59 y.o. MRN: [id number] PCP: Jerrell Cleatus Ned, MD  Chief Complaint  Patient presents with   Follow-up    Patient wants to discuss left side and lower left back.     Subjective:     HPI  Discussed the use of AI scribe software for clinical note transcription with the patient, who gave verbal consent to proceed.  History of Present Illness Jacob Foster is a 59 year old male with hypertension and Parkinson's disease who presents with persistent side and back pain.  He experiences persistent pain in his side and back, describing it as originating internally rather than externally. The pain is not linked to any specific external pressure or movement. There is no radiation of the pain to his legs, and no numbness or tingling.  He has been diagnosed with severe sleep apnea following a sleep study. He is in the process of obtaining a CPAP machine, delayed due to insurance approval. He experiences low energy levels, attributing them to inadequate sleep at night, describing his condition as 'insomnia all night' with some sleep during the day in short intervals.  His blood pressure remains stable, with systolic readings between 130-135 and diastolic readings between 70-85. He is currently taking hydrochlorothiazide  and losartan , having stopped carvedilol . No side effects such as lightheadedness or near syncope.  He has Parkinson's disease and has recently seen a new neurologist, who adjusted his medications to address issues with agitation and impulse control. He feels less agitated but experiences headaches and a sensation similar to withdrawal, although he denies any history of substance use.  He reports regular bowel movements and takes prune juice daily to manage constipation. He enjoys prune juice and has not experienced any recent issues with constipation.  He remains physically active, playing  basketball regularly, and has gained a small amount of weight, now weighing 193 pounds.     Objective:     BP 130/84   Pulse 69   Temp 98 F (36.7 C) (Oral)   Ht 5' 8 (1.727 m)   Wt 193 lb (87.5 kg)   SpO2 97%   BMI 29.35 kg/m   Physical Exam  Gen: Well-appearing man Neck: Normal thyroid , no nodules or adenopathy Heart: Regular, no murmur LUngs: Unlabored, clear throughout Ext: Warm, no edema Neuro: Prominent bradykinesia, resting left hand tremor, moderate stiffness in upper extremities and lower extremities, pretty normal get up and go and shuffling but steady gait    Assessment & Plan:   Problem List Items Addressed This Visit       High   Parkinson's disease (HCC) - Primary (Chronic)   His Parkinson's disease is managed by neurologist Dr. Maree, with medication adjustments made for agitation and impulse control issues. Continue the current Parkinson's medication regimen as adjusted.      Obstructive sleep apnea (Chronic)   Severe obstructive sleep apnea was confirmed by a sleep study. He is awaiting CPAP machine approval and fitting. Continue working with the sleep specialist to obtain the CPAP machine. Encourage patience and persistence with CPAP therapy.        Medium    Essential hypertension (Chronic)   His blood pressure is well-controlled with hydrochlorothiazide  and losartan . Continue the current antihypertensive regimen. Blood work was checked to monitor kidney function and electrolytes.      Relevant Orders   Basic metabolic panel with GFR   Magnesium  Erythrocytosis (Chronic)   Chronic issue.  Will check CBC again today.  I think this is most likely related to untreated sleep apnea.  Previously had a negative JAK2 testing.  He is also had consultation with hematology.  Should be starting CPAP therapy pretty soon, will hope to see the erythrocytosis improve in the coming months.        Relevant Orders   CBC with Differential/Platelet     Low    Chronic constipation (Chronic)   Chronic and stable.  Currently well-controlled with lifestyle modifications, he has started increasing his fiber intake.  Also using Amitiza  daily.      Low back pain (Chronic)   He has chronic low back pain, likely muscular in origin, with no high-risk features or neurological deficits. Continue current activity level as tolerated.        Return in about 6 months (around 01/24/2025).    Cleatus Debby Specking, MD [facility]   "

## 2024-07-27 NOTE — Assessment & Plan Note (Signed)
 He has chronic low back pain, likely muscular in origin, with no high-risk features or neurological deficits. Continue current activity level as tolerated.

## 2024-07-28 ENCOUNTER — Ambulatory Visit: Payer: Self-pay | Admitting: Student in an Organized Health Care Education/Training Program

## 2025-01-24 ENCOUNTER — Ambulatory Visit: Admitting: Student in an Organized Health Care Education/Training Program
